# Patient Record
Sex: Male | Born: 1945 | Race: White | Hispanic: No | Marital: Married | Smoking: Former smoker
Health system: Southern US, Community
[De-identification: ages and names within clinical notes are randomized; demographics above are authoritative.]

## PROBLEM LIST (undated history)

## (undated) DIAGNOSIS — I10 Essential (primary) hypertension: Secondary | ICD-10-CM

## (undated) DIAGNOSIS — E119 Type 2 diabetes mellitus without complications: Secondary | ICD-10-CM

## (undated) DIAGNOSIS — G4730 Sleep apnea, unspecified: Secondary | ICD-10-CM

## (undated) DIAGNOSIS — E2740 Unspecified adrenocortical insufficiency: Secondary | ICD-10-CM

## (undated) DIAGNOSIS — M1990 Unspecified osteoarthritis, unspecified site: Secondary | ICD-10-CM

## (undated) DIAGNOSIS — T380X5A Adverse effect of glucocorticoids and synthetic analogues, initial encounter: Secondary | ICD-10-CM

## (undated) DIAGNOSIS — H269 Unspecified cataract: Secondary | ICD-10-CM

## (undated) DIAGNOSIS — T7840XA Allergy, unspecified, initial encounter: Secondary | ICD-10-CM

## (undated) HISTORY — DX: Type 2 diabetes mellitus without complications: E11.9

## (undated) HISTORY — PX: TOTAL KNEE ARTHROPLASTY: SHX125

## (undated) HISTORY — PX: CATARACT EXTRACTION: SUR2

## (undated) HISTORY — PX: NOSE SURGERY: SHX723

## (undated) HISTORY — DX: Allergy, unspecified, initial encounter: T78.40XA

## (undated) HISTORY — PX: CARPAL TUNNEL RELEASE: SHX101

## (undated) HISTORY — DX: Sleep apnea, unspecified: G47.30

## (undated) HISTORY — DX: Unspecified cataract: H26.9

## (undated) HISTORY — PX: JOINT REPLACEMENT: SHX530

## (undated) HISTORY — PX: HERNIA REPAIR: SHX51

## (undated) HISTORY — DX: Unspecified osteoarthritis, unspecified site: M19.90

## (undated) HISTORY — DX: Unspecified adrenocortical insufficiency: E27.40

## (undated) HISTORY — DX: Adverse effect of glucocorticoids and synthetic analogues, initial encounter: T38.0X5A

## (undated) HISTORY — PX: UMBILICAL HERNIA REPAIR: SHX196

## (undated) HISTORY — DX: Essential (primary) hypertension: I10

## (undated) HISTORY — PX: EYE SURGERY: SHX253

---

## 2014-10-25 DIAGNOSIS — J309 Allergic rhinitis, unspecified: Secondary | ICD-10-CM | POA: Diagnosis not present

## 2014-10-25 DIAGNOSIS — E119 Type 2 diabetes mellitus without complications: Secondary | ICD-10-CM | POA: Diagnosis not present

## 2014-10-25 DIAGNOSIS — G4733 Obstructive sleep apnea (adult) (pediatric): Secondary | ICD-10-CM | POA: Diagnosis not present

## 2014-10-25 DIAGNOSIS — I10 Essential (primary) hypertension: Secondary | ICD-10-CM | POA: Diagnosis not present

## 2014-10-25 DIAGNOSIS — E669 Obesity, unspecified: Secondary | ICD-10-CM | POA: Diagnosis not present

## 2014-11-23 DIAGNOSIS — M150 Primary generalized (osteo)arthritis: Secondary | ICD-10-CM | POA: Diagnosis not present

## 2014-11-23 DIAGNOSIS — I868 Varicose veins of other specified sites: Secondary | ICD-10-CM | POA: Diagnosis not present

## 2014-11-23 DIAGNOSIS — J302 Other seasonal allergic rhinitis: Secondary | ICD-10-CM | POA: Diagnosis not present

## 2014-11-23 DIAGNOSIS — R739 Hyperglycemia, unspecified: Secondary | ICD-10-CM | POA: Diagnosis not present

## 2014-11-23 DIAGNOSIS — E538 Deficiency of other specified B group vitamins: Secondary | ICD-10-CM | POA: Diagnosis not present

## 2014-11-23 DIAGNOSIS — J0101 Acute recurrent maxillary sinusitis: Secondary | ICD-10-CM | POA: Diagnosis not present

## 2014-11-23 DIAGNOSIS — E119 Type 2 diabetes mellitus without complications: Secondary | ICD-10-CM | POA: Diagnosis not present

## 2014-11-23 DIAGNOSIS — G4733 Obstructive sleep apnea (adult) (pediatric): Secondary | ICD-10-CM | POA: Diagnosis not present

## 2014-11-23 DIAGNOSIS — I10 Essential (primary) hypertension: Secondary | ICD-10-CM | POA: Diagnosis not present

## 2014-12-18 DIAGNOSIS — H04123 Dry eye syndrome of bilateral lacrimal glands: Secondary | ICD-10-CM | POA: Diagnosis not present

## 2014-12-18 DIAGNOSIS — Z961 Presence of intraocular lens: Secondary | ICD-10-CM | POA: Diagnosis not present

## 2015-02-26 DIAGNOSIS — J302 Other seasonal allergic rhinitis: Secondary | ICD-10-CM | POA: Diagnosis not present

## 2015-02-26 DIAGNOSIS — E538 Deficiency of other specified B group vitamins: Secondary | ICD-10-CM | POA: Diagnosis not present

## 2015-02-26 DIAGNOSIS — B0230 Zoster ocular disease, unspecified: Secondary | ICD-10-CM | POA: Diagnosis not present

## 2015-02-26 DIAGNOSIS — G4733 Obstructive sleep apnea (adult) (pediatric): Secondary | ICD-10-CM | POA: Diagnosis not present

## 2015-02-26 DIAGNOSIS — R739 Hyperglycemia, unspecified: Secondary | ICD-10-CM | POA: Diagnosis not present

## 2015-02-26 DIAGNOSIS — I868 Varicose veins of other specified sites: Secondary | ICD-10-CM | POA: Diagnosis not present

## 2015-02-26 DIAGNOSIS — I10 Essential (primary) hypertension: Secondary | ICD-10-CM | POA: Diagnosis not present

## 2015-02-26 DIAGNOSIS — M150 Primary generalized (osteo)arthritis: Secondary | ICD-10-CM | POA: Diagnosis not present

## 2015-02-27 DIAGNOSIS — B029 Zoster without complications: Secondary | ICD-10-CM | POA: Diagnosis not present

## 2015-03-07 DIAGNOSIS — E538 Deficiency of other specified B group vitamins: Secondary | ICD-10-CM | POA: Diagnosis not present

## 2015-03-07 DIAGNOSIS — G4733 Obstructive sleep apnea (adult) (pediatric): Secondary | ICD-10-CM | POA: Diagnosis not present

## 2015-03-07 DIAGNOSIS — M150 Primary generalized (osteo)arthritis: Secondary | ICD-10-CM | POA: Diagnosis not present

## 2015-03-07 DIAGNOSIS — I868 Varicose veins of other specified sites: Secondary | ICD-10-CM | POA: Diagnosis not present

## 2015-03-07 DIAGNOSIS — R5383 Other fatigue: Secondary | ICD-10-CM | POA: Diagnosis not present

## 2015-03-07 DIAGNOSIS — E2740 Unspecified adrenocortical insufficiency: Secondary | ICD-10-CM | POA: Diagnosis not present

## 2015-03-07 DIAGNOSIS — B0230 Zoster ocular disease, unspecified: Secondary | ICD-10-CM | POA: Diagnosis not present

## 2015-03-07 DIAGNOSIS — T380X5A Adverse effect of glucocorticoids and synthetic analogues, initial encounter: Secondary | ICD-10-CM | POA: Diagnosis not present

## 2015-03-07 DIAGNOSIS — I10 Essential (primary) hypertension: Secondary | ICD-10-CM | POA: Diagnosis not present

## 2015-03-07 DIAGNOSIS — J302 Other seasonal allergic rhinitis: Secondary | ICD-10-CM | POA: Diagnosis not present

## 2015-03-07 DIAGNOSIS — I9589 Other hypotension: Secondary | ICD-10-CM | POA: Diagnosis not present

## 2015-03-07 LAB — COMPREHENSIVE METABOLIC PANEL: Albumin: 3.8

## 2015-03-07 LAB — CBC AND DIFFERENTIAL
Hemoglobin: 17.3 g/dL (ref 13.5–17.5)
Platelets: 208 10*3/uL (ref 150–399)
WBC: 11.5 10^3/mL

## 2015-03-07 LAB — HEPATIC FUNCTION PANEL
ALT: 21 U/L (ref 10–40)
AST: 26 U/L (ref 14–40)
Alkaline Phosphatase: 58 U/L (ref 25–125)

## 2015-03-07 LAB — TSH: TSH: 0.52 u[IU]/mL (ref 0.41–5.90)

## 2015-03-07 LAB — BASIC METABOLIC PANEL
CREATININE: 1.5 mg/dL — AB (ref 0.6–1.3)
Glucose: 272 mg/dL
POTASSIUM: 4.2 mmol/L (ref 3.4–5.3)
SODIUM: 133 mmol/L — AB (ref 137–147)

## 2015-03-07 LAB — LIPID PANEL
CHOLESTEROL: 192 mg/dL (ref 0–200)
HDL: 31 mg/dL — AB (ref 35–70)
LDL Cholesterol: 111 mg/dL
Triglycerides: 250 mg/dL — AB (ref 40–160)

## 2015-03-07 LAB — CORTISOL: CORTISOL: 58.44

## 2015-03-07 LAB — PSA: PSA: 1.06

## 2015-03-11 DIAGNOSIS — B029 Zoster without complications: Secondary | ICD-10-CM | POA: Diagnosis not present

## 2015-04-15 DIAGNOSIS — Z23 Encounter for immunization: Secondary | ICD-10-CM | POA: Diagnosis not present

## 2015-06-13 DIAGNOSIS — J301 Allergic rhinitis due to pollen: Secondary | ICD-10-CM | POA: Diagnosis not present

## 2015-06-13 DIAGNOSIS — E119 Type 2 diabetes mellitus without complications: Secondary | ICD-10-CM | POA: Diagnosis not present

## 2015-06-13 DIAGNOSIS — I10 Essential (primary) hypertension: Secondary | ICD-10-CM | POA: Diagnosis not present

## 2015-06-13 DIAGNOSIS — Z125 Encounter for screening for malignant neoplasm of prostate: Secondary | ICD-10-CM | POA: Diagnosis not present

## 2015-06-13 DIAGNOSIS — Z0000 Encounter for general adult medical examination without abnormal findings: Secondary | ICD-10-CM | POA: Diagnosis not present

## 2015-06-13 DIAGNOSIS — G4733 Obstructive sleep apnea (adult) (pediatric): Secondary | ICD-10-CM | POA: Diagnosis not present

## 2015-06-13 DIAGNOSIS — E538 Deficiency of other specified B group vitamins: Secondary | ICD-10-CM | POA: Diagnosis not present

## 2015-06-13 DIAGNOSIS — M150 Primary generalized (osteo)arthritis: Secondary | ICD-10-CM | POA: Diagnosis not present

## 2015-06-13 LAB — HEMOGLOBIN A1C: HEMOGLOBIN A1C: 6.1

## 2015-06-13 LAB — LIPID PANEL
CHOLESTEROL: 192 mg/dL (ref 0–200)
HDL: 31 mg/dL — AB (ref 35–70)
LDL Cholesterol: 111 mg/dL
LDl/HDL Ratio: 3.6
Triglycerides: 250 mg/dL — AB (ref 40–160)

## 2015-06-13 LAB — CBC AND DIFFERENTIAL
HEMOGLOBIN: 17.3 g/dL (ref 13.5–17.5)
PLATELETS: 249 10*3/uL (ref 150–399)
WBC: 5.4 10^3/mL

## 2015-06-13 LAB — BASIC METABOLIC PANEL
CREATININE: 0.9 mg/dL (ref 0.6–1.3)
Glucose: 112 mg/dL
POTASSIUM: 4.6 mmol/L (ref 3.4–5.3)
SODIUM: 138 mmol/L (ref 137–147)

## 2015-06-13 LAB — PSA: PSA: 1.06

## 2015-06-13 LAB — HEPATIC FUNCTION PANEL
ALT: 30 U/L (ref 10–40)
AST: 33 U/L (ref 14–40)

## 2015-06-13 LAB — MICROALBUMIN, URINE: MICROALB UR: NEGATIVE

## 2015-08-21 DIAGNOSIS — R739 Hyperglycemia, unspecified: Secondary | ICD-10-CM | POA: Diagnosis not present

## 2015-08-21 DIAGNOSIS — E119 Type 2 diabetes mellitus without complications: Secondary | ICD-10-CM | POA: Diagnosis not present

## 2015-08-21 DIAGNOSIS — M1710 Unilateral primary osteoarthritis, unspecified knee: Secondary | ICD-10-CM | POA: Diagnosis not present

## 2015-08-21 DIAGNOSIS — J0101 Acute recurrent maxillary sinusitis: Secondary | ICD-10-CM | POA: Diagnosis not present

## 2015-08-21 DIAGNOSIS — J301 Allergic rhinitis due to pollen: Secondary | ICD-10-CM | POA: Diagnosis not present

## 2015-08-21 DIAGNOSIS — G4733 Obstructive sleep apnea (adult) (pediatric): Secondary | ICD-10-CM | POA: Diagnosis not present

## 2015-08-21 DIAGNOSIS — M150 Primary generalized (osteo)arthritis: Secondary | ICD-10-CM | POA: Diagnosis not present

## 2015-08-21 DIAGNOSIS — I10 Essential (primary) hypertension: Secondary | ICD-10-CM | POA: Diagnosis not present

## 2015-08-22 DIAGNOSIS — I10 Essential (primary) hypertension: Secondary | ICD-10-CM | POA: Diagnosis not present

## 2015-08-22 DIAGNOSIS — E119 Type 2 diabetes mellitus without complications: Secondary | ICD-10-CM | POA: Diagnosis not present

## 2015-08-22 DIAGNOSIS — E669 Obesity, unspecified: Secondary | ICD-10-CM | POA: Diagnosis not present

## 2015-08-22 DIAGNOSIS — J309 Allergic rhinitis, unspecified: Secondary | ICD-10-CM | POA: Diagnosis not present

## 2015-08-22 DIAGNOSIS — G4733 Obstructive sleep apnea (adult) (pediatric): Secondary | ICD-10-CM | POA: Diagnosis not present

## 2015-12-17 ENCOUNTER — Encounter: Payer: Self-pay | Admitting: Family Medicine

## 2015-12-17 ENCOUNTER — Ambulatory Visit (INDEPENDENT_AMBULATORY_CARE_PROVIDER_SITE_OTHER): Payer: Medicare Other | Admitting: Family Medicine

## 2015-12-17 VITALS — BP 128/77 | HR 79 | Ht 74.0 in | Wt 242.0 lb

## 2015-12-17 DIAGNOSIS — E11621 Type 2 diabetes mellitus with foot ulcer: Secondary | ICD-10-CM | POA: Insufficient documentation

## 2015-12-17 DIAGNOSIS — E119 Type 2 diabetes mellitus without complications: Secondary | ICD-10-CM | POA: Diagnosis not present

## 2015-12-17 DIAGNOSIS — E785 Hyperlipidemia, unspecified: Secondary | ICD-10-CM | POA: Diagnosis not present

## 2015-12-17 DIAGNOSIS — G4700 Insomnia, unspecified: Secondary | ICD-10-CM | POA: Insufficient documentation

## 2015-12-17 DIAGNOSIS — E1159 Type 2 diabetes mellitus with other circulatory complications: Secondary | ICD-10-CM | POA: Insufficient documentation

## 2015-12-17 DIAGNOSIS — E1142 Type 2 diabetes mellitus with diabetic polyneuropathy: Secondary | ICD-10-CM | POA: Diagnosis not present

## 2015-12-17 DIAGNOSIS — Z96652 Presence of left artificial knee joint: Secondary | ICD-10-CM

## 2015-12-17 DIAGNOSIS — I10 Essential (primary) hypertension: Secondary | ICD-10-CM

## 2015-12-17 DIAGNOSIS — E663 Overweight: Secondary | ICD-10-CM | POA: Insufficient documentation

## 2015-12-17 DIAGNOSIS — E669 Obesity, unspecified: Secondary | ICD-10-CM

## 2015-12-17 DIAGNOSIS — L97509 Non-pressure chronic ulcer of other part of unspecified foot with unspecified severity: Secondary | ICD-10-CM

## 2015-12-17 DIAGNOSIS — E1149 Type 2 diabetes mellitus with other diabetic neurological complication: Secondary | ICD-10-CM | POA: Insufficient documentation

## 2015-12-17 HISTORY — DX: Hyperlipidemia, unspecified: E78.5

## 2015-12-17 LAB — POCT GLYCOSYLATED HEMOGLOBIN (HGB A1C): HEMOGLOBIN A1C: 5.8

## 2015-12-17 MED ORDER — ATORVASTATIN CALCIUM 40 MG PO TABS: 40.0000 mg | ORAL_TABLET | Freq: Every day | ORAL | Status: DC

## 2015-12-17 NOTE — Assessment & Plan Note (Signed)
Continue current regimen.  Check labs in 3 months. 

## 2015-12-17 NOTE — Assessment & Plan Note (Signed)
Continue current regimen

## 2015-12-17 NOTE — Assessment & Plan Note (Signed)
Continue current diabetes regimen. Recheck labs in 3 months. Recommend daily foot inspection.  

## 2015-12-17 NOTE — Progress Notes (Signed)
Luis Fernandez is a 70 y.o. male who presents to [facility]: [facility] today for establish care today discuss diabetes and hypertension.  1) diabetes: Well controlled with the below medical regimen. His last A1c was 6.1 in November. He feels well without complications.  2) retention well-controlled with the below regimen. No chest pains palpitations or shortness of breath.  3) lipids: Patient notes a history of elevated triglycerides. He takes fenofibrate but has never taken a statin medication.  4) insomnia: Patient uses temazepam at night for insomnia.   Past Medical History  Diagnosis Date  . Hypertension   . Diabetes mellitus without complication [facility])    Past Surgical History  Procedure Laterality Date  . Cataract extraction    . Total knee arthroplasty    . Umbilical hernia repair    . Carpal tunnel release    . Nose surgery     Social History  Substance Use Topics  . Smoking status: Former Games developer  . Smokeless tobacco: Not on file  . Alcohol Use: 0.0 oz/week    0 Standard drinks or equivalent per week   family history includes Depression in his brother; Heart disease in his mother; Hypertension in his brother and mother.  ROS as above: No headache, visual changes, nausea, vomiting, diarrhea, constipation, dizziness, abdominal pain, skin rash, fevers, chills, night sweats, weight loss, swollen lymph nodes, body aches, joint swelling, muscle aches, chest pain, shortness of breath, mood changes, visual or auditory hallucinations.   Medications: Current Outpatient Prescriptions  Medication Sig Dispense Refill  . azelastine (ASTELIN) 0.1 % nasal spray     . BD PEN NEEDLE NANO U/F 32G X 4 MM MISC     . benazepril-hydrochlorthiazide (LOTENSIN HCT) 20-12.5 MG tablet     . cephALEXin (KEFLEX) 500 MG capsule     . Cyanocobalamin (B-12) 500 MCG TABS Take by mouth.    .  cyproheptadine (PERIACTIN) 4 MG tablet     . fenofibrate (TRICOR) 145 MG tablet     . Glucosamine 500 MG CAPS Take by mouth.    Marland Kitchen ibuprofen (ADVIL,MOTRIN) 600 MG tablet     . JARDIANCE 25 MG TABS tablet     . LORazepam (ATIVAN) 0.5 MG tablet     . MELATONIN PO Take by mouth.    . metFORMIN (GLUCOPHAGE-XR) 500 MG 24 hr tablet     . Multiple Vitamins-Minerals (MULTIVITAMIN PO) Take by mouth.    . Omega-3 Fatty Acids (FISH OIL PO) Take by mouth.    Lu[facility] DELICA LANCETS 33G MISC     . temazepam (RESTORIL) 30 MG capsule     . VICTOZA 18 MG/3ML SOPN     . atorvastatin (LIPITOR) 40 MG tablet Take 1 tablet (40 mg total) by mouth daily. 90 tablet 0   No current facility-administered medications for this visit.   No Known Allergies   Exam:  BP 128/77 mmHg  Pulse 79  Ht  (1.88 m)  Wt 242 lb (109.77 kg)  BMI 31.06 kg/m2 Gen: Well NAD, obese HEENT: EOMI,  MMM Lungs: Normal work of breathing. CTABL Heart: RRR no MRG Abd: NABS, Soft. Nondistended, Nontender Exts: Brisk capillary refill, warm and well perfused.  Diabetic Foot Exam - Simple   Simple Foot Form  Diabetic Foot exam was performed with the following findings:  Yes 12/17/2015 11:29 AM  Visual Inspection  Sensation Testing  Pulse Check  Comments  Small blood blister on the medial  aspect of the left great toe and a small blister between left toes 3 and 4. Several areas of athletes foot as well.        Results for orders placed or performed in visit on 12/17/15 (from the past 24 hour(s))  POCT HgB A1C     Status: None   Collection Time: 12/17/15 10:35 AM  Result Value Ref Range   Hemoglobin A1C 5.8    No results found.   Please see individual assessment and plan sections.

## 2015-12-17 NOTE — Assessment & Plan Note (Signed)
Continue current diabetes regimen. Recheck labs in 3 months. Recommend daily foot inspection.

## 2015-12-17 NOTE — Assessment & Plan Note (Signed)
Start Lipitor 40. Continue fenofibrate. Check lipids in 3 months.

## 2015-12-17 NOTE — Patient Instructions (Signed)
Thank you for coming in today. Take lipitor.  Return in 3 months.  Inspect your feet daily.  Return sooner if needed.   Diabetes and Foot Care Diabetes may cause you to have problems because of poor blood supply (circulation) to your feet and legs. This may cause the skin on your feet to become thinner, break easier, and heal more slowly. Your skin may become dry, and the skin may peel and crack. You may also have nerve damage in your legs and feet causing decreased feeling in them. You may not notice minor injuries to your feet that could lead to infections or more serious problems. Taking care of your feet is one of the most important things you can do for yourself.  HOME CARE INSTRUCTIONS  Wear shoes at all times, even in the house. Do not go barefoot. Bare feet are easily injured.  Check your feet daily for blisters, cuts, and redness. If you cannot see the bottom of your feet, use a mirror or ask someone for help.  Wash your feet with warm water (do not use hot water) and mild soap. Then pat your feet and the areas between your toes until they are completely dry. Do not soak your feet as this can dry your skin.  Apply a moisturizing lotion or petroleum jelly (that does not contain alcohol and is unscented) to the skin on your feet and to dry, brittle toenails. Do not apply lotion between your toes.  Trim your toenails straight across. Do not dig under them or around the cuticle. File the edges of your nails with an emery board or nail file.  Do not cut corns or calluses or try to remove them with medicine.  Wear clean socks or stockings every day. Make sure they are not too tight. Do not wear knee-high stockings since they may decrease blood flow to your legs.  Wear shoes that fit properly and have enough cushioning. To break in new shoes, wear them for just a few hours a day. This prevents you from injuring your feet. Always look in your shoes before you put them on to be sure there are  no objects inside.  Do not cross your legs. This may decrease the blood flow to your feet.  If you find a minor scrape, cut, or break in the skin on your feet, keep it and the skin around it clean and dry. These areas may be cleansed with mild soap and water. Do not cleanse the area with peroxide, alcohol, or iodine.  When you remove an adhesive bandage, be sure not to damage the skin around it.  If you have a wound, look at it several times a day to make sure it is healing.  Do not use heating pads or hot water bottles. They may burn your skin. If you have lost feeling in your feet or legs, you may not know it is happening until it is too late.  Make sure your health care provider performs a complete foot exam at least annually or more often if you have foot problems. Report any cuts, sores, or bruises to your health care provider immediately. SEEK MEDICAL CARE IF:   You have an injury that is not healing.  You have cuts or breaks in the skin.  You have an ingrown nail.  You notice redness on your legs or feet.  You feel burning or tingling in your legs or feet.  You have pain or cramps in your legs and  feet.  Your legs or feet are numb.  Your feet always feel cold. SEEK IMMEDIATE MEDICAL CARE IF:   There is increasing redness, swelling, or pain in or around a wound.  There is a red line that goes up your leg.  Pus is coming from a wound.  You develop a fever or as directed by your health care provider.  You notice a bad smell coming from an ulcer or wound.   This information is not intended to replace advice given to you by your health care provider. Make sure you discuss any questions you have with your health care provider.   Document Released: 07/17/2000 Document Revised: 03/22/2013 Document Reviewed: 12/27/2012 Elsevier Interactive Patient Education [employer].

## 2016-01-02 ENCOUNTER — Encounter: Payer: Self-pay | Admitting: Family Medicine

## 2016-01-03 ENCOUNTER — Encounter: Payer: Self-pay | Admitting: Family Medicine

## 2016-01-03 ENCOUNTER — Telehealth: Payer: Self-pay

## 2016-01-03 MED ORDER — METFORMIN HCL ER 500 MG PO TB24: ORAL_TABLET | ORAL | Status: DC

## 2016-01-03 NOTE — Telephone Encounter (Signed)
Pts wife advised that he will be out of this rx today. Will refill per provider note from 12/17/15 stating to Continue current regiment.

## 2016-01-03 NOTE — Telephone Encounter (Signed)
Pt is requesting a refill on metFORMIN (GLUCOPHAGE-XR) 500 MG 24 hr tablet. This is a historical rx. Please fill if appropriate.

## 2016-01-06 MED ORDER — METFORMIN HCL ER 500 MG PO TB24: 1000.0000 mg | ORAL_TABLET | Freq: Two times a day (BID) | ORAL | Status: DC

## 2016-01-06 NOTE — Telephone Encounter (Signed)
1 

## 2016-01-08 ENCOUNTER — Ambulatory Visit: Payer: Self-pay | Admitting: Internal Medicine

## 2016-01-10 ENCOUNTER — Encounter: Payer: Self-pay | Admitting: Family Medicine

## 2016-01-12 ENCOUNTER — Encounter: Payer: Self-pay | Admitting: Family Medicine

## 2016-01-21 ENCOUNTER — Encounter: Payer: Self-pay | Admitting: Family Medicine

## 2016-01-21 DIAGNOSIS — T380X5A Adverse effect of glucocorticoids and synthetic analogues, initial encounter: Secondary | ICD-10-CM

## 2016-01-21 DIAGNOSIS — E538 Deficiency of other specified B group vitamins: Secondary | ICD-10-CM | POA: Insufficient documentation

## 2016-01-21 DIAGNOSIS — E2740 Unspecified adrenocortical insufficiency: Secondary | ICD-10-CM

## 2016-01-21 HISTORY — DX: Adverse effect of glucocorticoids and synthetic analogues, initial encounter: T38.0X5A

## 2016-01-21 HISTORY — DX: Unspecified adrenocortical insufficiency: E27.40

## 2016-01-29 ENCOUNTER — Encounter: Payer: Self-pay | Admitting: Family Medicine

## 2016-02-01 ENCOUNTER — Encounter: Payer: Self-pay | Admitting: Family Medicine

## 2016-02-03 MED ORDER — JARDIANCE 25 MG PO TABS: 25.0000 mg | ORAL_TABLET | Freq: Every day | ORAL | Status: DC

## 2016-02-03 NOTE — Telephone Encounter (Signed)
Ok, sent 90 days supply with refills

## 2016-02-21 ENCOUNTER — Encounter: Payer: Self-pay | Admitting: Family Medicine

## 2016-02-21 MED ORDER — FENOFIBRATE 145 MG PO TABS: 145.0000 mg | ORAL_TABLET | Freq: Every day | ORAL | Status: DC

## 2016-02-24 ENCOUNTER — Encounter: Payer: Self-pay | Admitting: Family Medicine

## 2016-02-25 ENCOUNTER — Encounter: Payer: Self-pay | Admitting: Family Medicine

## 2016-02-25 DIAGNOSIS — E119 Type 2 diabetes mellitus without complications: Secondary | ICD-10-CM | POA: Diagnosis not present

## 2016-02-25 DIAGNOSIS — H43393 Other vitreous opacities, bilateral: Secondary | ICD-10-CM | POA: Diagnosis not present

## 2016-02-25 LAB — HM DIABETES EYE EXAM

## 2016-02-26 MED ORDER — IBUPROFEN 600 MG PO TABS: 600.0000 mg | ORAL_TABLET | Freq: Three times a day (TID) | ORAL | 1 refills | Status: DC | PRN

## 2016-02-28 ENCOUNTER — Encounter: Payer: Self-pay | Admitting: Family Medicine

## 2016-03-18 ENCOUNTER — Encounter: Payer: Self-pay | Admitting: Family Medicine

## 2016-03-18 ENCOUNTER — Ambulatory Visit (INDEPENDENT_AMBULATORY_CARE_PROVIDER_SITE_OTHER): Payer: Medicare Other | Admitting: Family Medicine

## 2016-03-18 VITALS — BP 120/68 | HR 71 | Temp 98.1°F | Wt 249.0 lb

## 2016-03-18 DIAGNOSIS — M949 Disorder of cartilage, unspecified: Secondary | ICD-10-CM

## 2016-03-18 DIAGNOSIS — E785 Hyperlipidemia, unspecified: Secondary | ICD-10-CM | POA: Diagnosis not present

## 2016-03-18 DIAGNOSIS — Z1159 Encounter for screening for other viral diseases: Secondary | ICD-10-CM

## 2016-03-18 DIAGNOSIS — E1142 Type 2 diabetes mellitus with diabetic polyneuropathy: Secondary | ICD-10-CM

## 2016-03-18 DIAGNOSIS — M899 Disorder of bone, unspecified: Secondary | ICD-10-CM

## 2016-03-18 DIAGNOSIS — E538 Deficiency of other specified B group vitamins: Secondary | ICD-10-CM | POA: Diagnosis not present

## 2016-03-18 DIAGNOSIS — I10 Essential (primary) hypertension: Secondary | ICD-10-CM

## 2016-03-18 LAB — LIPID PANEL
Cholesterol: 168 mg/dL (ref 125–200)
HDL: 33 mg/dL — ABNORMAL LOW (ref >=40)
LDL Cholesterol: 93 mg/dL (ref <130)
Total CHOL/HDL Ratio: 5.1 Ratio — ABNORMAL HIGH (ref <=5.0)
Triglycerides: 211 mg/dL — ABNORMAL HIGH (ref <150)
VLDL: 42 mg/dL — AB (ref <30)

## 2016-03-18 LAB — POCT GLYCOSYLATED HEMOGLOBIN (HGB A1C): HEMOGLOBIN A1C: 5.9

## 2016-03-18 LAB — COMPREHENSIVE METABOLIC PANEL
ALK PHOS: 41 U/L (ref 40–115)
ALT: 32 U/L (ref 9–46)
AST: 35 U/L (ref 10–35)
Albumin: 4.5 g/dL (ref 3.6–5.1)
BILIRUBIN TOTAL: 0.7 mg/dL (ref 0.2–1.2)
BUN: 22 mg/dL (ref 7–25)
CO2: 27 mmol/L (ref 20–31)
CREATININE: 0.99 mg/dL (ref 0.70–1.18)
Calcium: 9.7 mg/dL (ref 8.6–10.3)
Chloride: 104 mmol/L (ref 98–110)
Glucose, Bld: 104 mg/dL — ABNORMAL HIGH (ref 65–99)
Potassium: 4.2 mmol/L (ref 3.5–5.3)
SODIUM: 139 mmol/L (ref 135–146)
Total Protein: 6.5 g/dL (ref 6.1–8.1)

## 2016-03-18 LAB — CBC
HCT: 46.3 % (ref 38.5–50.0)
HEMOGLOBIN: 15.9 g/dL (ref 13.2–17.1)
MCH: 32.5 pg (ref 27.0–33.0)
MCHC: 34.3 g/dL (ref 32.0–36.0)
MCV: 94.7 fL (ref 80.0–100.0)
MPV: 9 fL (ref 7.5–12.5)
PLATELETS: 255 10*3/uL (ref 140–400)
RBC: 4.89 MIL/uL (ref 4.20–5.80)
RDW: 13.5 % (ref 11.0–15.0)
WBC: 5.2 10*3/uL (ref 3.8–10.8)

## 2016-03-18 LAB — VITAMIN B12: VITAMIN B 12: 430 pg/mL (ref 200–1100)

## 2016-03-18 MED ORDER — DULAGLUTIDE 0.75 MG/0.5ML ~~LOC~~ SOAJ: 0.5000 mL | SUBCUTANEOUS | 11 refills | Status: DC

## 2016-03-18 MED ORDER — EMPAGLIFLOZIN 25 MG PO TABS: 25.0000 mg | ORAL_TABLET | Freq: Every day | ORAL | 2 refills | Status: DC

## 2016-03-18 MED ORDER — LIRAGLUTIDE 18 MG/3ML ~~LOC~~ SOPN: 1.8000 mg | PEN_INJECTOR | Freq: Every day | SUBCUTANEOUS | 12 refills | Status: DC

## 2016-03-18 MED ORDER — LORAZEPAM 0.5 MG PO TABS: 0.5000 mg | ORAL_TABLET | Freq: Every day | ORAL | 5 refills | Status: DC | PRN

## 2016-03-18 MED ORDER — PITAVASTATIN CALCIUM 4 MG PO TABS: 1.0000 | ORAL_TABLET | Freq: Every day | ORAL | 0 refills | Status: DC

## 2016-03-18 MED ORDER — TEMAZEPAM 30 MG PO CAPS: 30.0000 mg | ORAL_CAPSULE | Freq: Every evening | ORAL | 5 refills | Status: DC | PRN

## 2016-03-18 NOTE — Progress Notes (Signed)
Luis Fernandez is a 70 y.o. male who [name] today for follow-up diabetes, hypertension, hyperlipidemia, B-12 deficiency.  1) diabetes: Doing very well with current regimen. Patient brings a glucose log to the visit today showing blood sugars ranging from 86-159. He notes most of the sugars are in the normal range with no polyuria or polydipsia. He tolerates his medications well.  2) hypertension: Patient is also doing well with the below regimen. No chest pains palpitations or shortness of breath.  3) hyperlipidemia: Patient notes he had trouble tolerating Lipitor. It caused muscle cramps and pain. His symptoms resolved when he discontinued Lipitor on his own.  4) B-12 deficiency: Patient is doing well with oral B-12. No new paresthesias.  5) health maintenance: Patient declines colonoscopy stating he's not interested in any further colonoscopies for screening for colon cancer at this time. He is interested in hepatitis C screening.   Past Medical History:  Diagnosis Date  . Diabetes mellitus without complication (HCC)   . Hypertension    Past Surgical History:  Procedure Laterality Date  . CARPAL TUNNEL RELEASE    . CATARACT EXTRACTION    . NOSE SURGERY    . TOTAL KNEE ARTHROPLASTY    . UMBILICAL HERNIA REPAIR     Social History  Substance Use Topics  . Smoking status: Former Games developermoker  . Smokeless tobacco: Not on file  . Alcohol use 0.0 oz/week   family history includes Depression in his brother; Heart disease in his mother; Hypertension in his brother and mother.  ROS as above:  Medications: Current Outpatient Prescriptions  Medication Sig Dispense Refill  . azelastine (ASTELIN) 0.1 % nasal spray     . BD PEN NEEDLE NANO U/F 32G X 4 MM MISC     . benazepril-hydrochlorthiazide (LOTENSIN HCT) 20-12.5 MG tablet     . cephALEXin (KEFLEX)  500 MG capsule     . Cyanocobalamin (B-12) 500 MCG TABS Take by mouth.    . cyproheptadine (PERIACTIN) 4 MG tablet     . empagliflozin (JARDIANCE) 25 MG TABS tablet Take 25 mg by mouth daily. 90 tablet 2  . fenofibrate (TRICOR) 145 MG tablet Take 1 tablet (145 mg total) by mouth daily. 90 tablet 1  . Glucosamine 500 MG CAPS Take by mouth.    Marland Kitchen. ibuprofen (ADVIL,MOTRIN) 600 MG tablet Take 1 tablet (600 mg total) by mouth every 8 (eight) hours as needed. 90 tablet 1  . Liraglutide (VICTOZA) 18 MG/3ML SOPN Inject 0.3 mLs (1.8 mg total) into the skin daily. 6 mL 12  . LORazepam (ATIVAN) 0.5 MG tablet Take 1 tablet (0.5 mg total) by mouth daily as needed (Cramps). 30 tablet 5  . MELATONIN PO Take by mouth.    . metFORMIN (GLUCOPHAGE-XR) 500 MG 24 hr tablet Take 2 tablets (1,000 mg total) by mouth 2 (two) times daily. Take 1 tablet by mouth twice daily. 120 tablet 4  . Multiple Vitamins-Minerals (MULTIVITAMIN PO) Take by mouth.    . Omega-3 Fatty Acids (FISH OIL PO) Take by mouth.    [name]. ONETOUCH DELICA LANCETS 33G MISC     . Pitavastatin Calcium 4 MG TABS Take 1 tablet (4 mg total) by mouth daily. 90 tablet 0  . temazepam (RESTORIL) 30 MG capsule Take 1 capsule (30 mg total) by mouth at bedtime as needed for sleep. 30 capsule 5   No current facility-administered medications for this visit.  No Known Allergies   Exam:  BP 120/68 (BP Location: Right Arm, Patient Position: Sitting, Cuff Size: Normal)   Pulse 71   Temp 98.1 F (36.7 C) (Oral)   Wt 249 lb 0.6 oz (113 kg)   SpO2 95%   BMI 31.97 kg/m  Gen: Well NAD Nontoxic appearing HEENT: EOMI,  MMM Lungs: Normal work of breathing. CTABL Heart: RRR no MRG Abd: NABS, Soft. Nondistended, Nontender Exts: Brisk capillary refill, warm and well perfused. Normal diabetic foot exam with no ulcerations present today. Pulses intact bilaterally.  Results for orders placed or performed in visit on 03/18/16 (from the past 24 hour(s))  POCT HgB A1C      Status: None   Collection Time: 03/18/16 10:34 AM  Result Value Ref Range   Hemoglobin A1C 5.9    No results found.    Assessment and Plan: 70 y.o. male with  1) diabetes: Doing well. Continue current regimen.  2) hypertension: Also doing well. Continue current regimen. 3) hyperlipidemia: Not at goal. Intolerant to Lipitor. Trial of Livalo. Check fasting labs and recheck in 3 months. 4) B-12 deficiency: Doing well. Check B-12 today 5) health maintenance: Colonoscopy deferred. Check hepatitis C. Patient will get a high dose flu vaccine when they are available   Orders Placed This Encounter  Procedures  . CBC  . Comprehensive metabolic panel    Order Specific Question:   Has the patient fasted?    Answer:   No  . Lipid panel    Order Specific Question:   Has the patient fasted?    Answer:   No  . VITAMIN D 25 Hydroxy (Vit-D Deficiency, Fractures)  . Hepatitis C antibody  . Vitamin B12  . POCT HgB A1C    Discussed warning signs or symptoms. Please see discharge instructions. Patient expresses understanding.

## 2016-03-18 NOTE — Patient Instructions (Signed)
Thank you for coming in today. Get labs today.  We will contact you with results.  Return in 3 months for recheck.  Ask you pharmacist if Trulicity or Bydureon would be cheaper.  We will switch to Livalo for cholesterol.  Let me know if you get cramps or muscle pain.  I recommend getting the high dose flu vaccine this fall either here or at a pharmacy.  If you get it at a pharmacy please let me know.    Pitavastatin oral tablets What is this medicine? PITAVASTATIN (pit A va STAT in) is known as a HMG-CoA reductase inhibitor or 'statin'. It lowers the level of cholesterol and triglycerides in the blood. Diet and lifestyle changes are often used with this drug. This medicine may be used for other purposes; ask your health care provider or pharmacist if you have questions. What should I tell my health care provider before I take this medicine? They need to know if you have any of these conditions: - frequently drink alcoholic beverages - kidney disease - liver disease - muscle aches or weakness - other medical condition - an unusual or allergic reaction to pitavastatin, other medicines, foods, dyes, or preservatives - pregnant or trying to get pregnant - breast-feeding How should I use this medicine? Take this medicine by mouth with a glass of water. Follow the directions on the prescription label. You can take this medicine with or without food. Take your doses at regular intervals. Do not take your medicine more often than directed. Talk to your pediatrician regarding the use of this medicine in children. Special care may be needed. Overdosage: If you think you have taken too much of this medicine contact a poison control center or emergency room at once. NOTE: This medicine is only for you. Do not share this medicine with others. What if I miss a dose? If you miss a dose, take it as soon as you can. If it is almost time for your next dose, take only that dose. Do not take double or  extra doses. What may interact with this medicine? Do not take this medicine with any of the following medications: - cyclosporine - gemfibrozil - herbal medicines like red yeast rice This medicine may also interact with the following medications: - alcohol - antiviral medicines for HIV or AIDS - erythromycin - other medicines for cholesterol - rifampin - warfarin This list may not describe all possible interactions. Give your health care provider a list of all the medicines, herbs, non-prescription drugs, or dietary supplements you use. Also tell them if you smoke, drink alcohol, or use illegal drugs. Some items may interact with your medicine. What should I watch for while using this medicine? Visit your doctor or health care professional for regular check-ups. You may need regular tests to make sure your liver is working properly. Tell your doctor or health care professional right away if you get any unexplained muscle pain, tenderness, or weakness, especially if you also have a fever and tiredness. Your doctor or health care professional may tell you to stop taking this medicine if you develop muscle problems. If your muscle problems do not go away after stopping this medicine, contact your health care professional. This medicine may affect blood sugar levels. If you have diabetes, check with your doctor or health care professional before you change your diet or the dose of your diabetic medicine. This drug is only part of a total heart-health program. Your doctor or a dietician can suggest  a low-cholesterol and low-fat diet to help. Avoid alcohol and smoking, and keep a proper exercise schedule. Do not become pregnant while taking this medicine. Women should inform their doctor if they wish to become pregnant or think they might be pregnant. There is a potential for serious side effects to an unborn child. Do not breast-feed an infant while taking this medicine. Serious side effects to an  unborn child or to an infant are possible. Talk to your doctor or pharmacist for more information. What side effects may I notice from receiving this medicine? Side effects that you should report to your doctor or health care professional as soon as possible: - allergic reactions like skin rash, itching or hives, swelling of the face, lips, or tongue - dark urine - fever - joint pain - muscle cramps or pain - redness, blistering, peeling or loosening of the skin, including inside the mouth - trouble passing urine or change in the amount of urine - unusually weak or tired - yellowing of the eyes or skin Side effects that usually do not require medical attention (Report these to your doctor or health care professional if they continue or are bothersome.): - constipation - heartburn - nausea This list may not describe all possible side effects. Call your doctor for medical advice about side effects. You may report side effects to FDA at 1-800-FDA-1088. Where should I keep my medicine? Keep out of the reach of children. Store at room temperature between 15 and 30 degrees C (59 and 86 degrees F). Protect from light. Throw away any unused medicine after the expiration date. NOTE: This sheet is a summary. It may not cover all possible information. If you have questions about this medicine, talk to your doctor, pharmacist, or health care provider.    2016, Elsevier/Gold Standard. (2012-05-18 17:18:15)

## 2016-03-18 NOTE — Progress Notes (Signed)
Pt here for follow up POC A1c-5.9

## 2016-03-19 LAB — VITAMIN D 25 HYDROXY (VIT D DEFICIENCY, FRACTURES): VIT D 25 HYDROXY: 32 ng/mL (ref 30–100)

## 2016-03-19 LAB — HEPATITIS C ANTIBODY: HCV AB: NEGATIVE

## 2016-03-20 ENCOUNTER — Encounter: Payer: Self-pay | Admitting: Family Medicine

## 2016-03-20 ENCOUNTER — Telehealth: Payer: Self-pay | Admitting: *Deleted

## 2016-03-20 NOTE — Telephone Encounter (Signed)
PA sent through covermymeds Key: [id number] - PA Case ID: [id number]

## 2016-03-23 NOTE — Telephone Encounter (Signed)
livalo has been denied by insurance. Letter placed in providers box.

## 2016-03-24 ENCOUNTER — Encounter: Payer: Self-pay | Admitting: Family Medicine

## 2016-03-26 MED ORDER — PRAVASTATIN SODIUM 80 MG PO TABS: 80.0000 mg | ORAL_TABLET | Freq: Every day | ORAL | 0 refills | Status: DC

## 2016-03-27 ENCOUNTER — Encounter: Payer: Self-pay | Admitting: Family Medicine

## 2016-04-20 ENCOUNTER — Encounter: Payer: Self-pay | Admitting: Family Medicine

## 2016-04-21 MED ORDER — BENAZEPRIL-HYDROCHLOROTHIAZIDE 20-12.5 MG PO TABS: 1.0000 | ORAL_TABLET | Freq: Every day | ORAL | 3 refills | Status: DC

## 2016-05-12 ENCOUNTER — Encounter: Payer: Self-pay | Admitting: Family Medicine

## 2016-05-12 DIAGNOSIS — Z23 Encounter for immunization: Secondary | ICD-10-CM | POA: Diagnosis not present

## 2016-05-26 ENCOUNTER — Other Ambulatory Visit: Payer: Self-pay | Admitting: Family Medicine

## 2016-06-02 ENCOUNTER — Encounter: Payer: Self-pay | Admitting: Family Medicine

## 2016-06-04 MED ORDER — LIRAGLUTIDE 18 MG/3ML ~~LOC~~ SOPN: 1.8000 mg | PEN_INJECTOR | Freq: Every day | SUBCUTANEOUS | 3 refills | Status: DC

## 2016-06-11 ENCOUNTER — Other Ambulatory Visit: Payer: Self-pay | Admitting: Family Medicine

## 2016-06-11 ENCOUNTER — Encounter: Payer: Self-pay | Admitting: Family Medicine

## 2016-06-18 ENCOUNTER — Ambulatory Visit: Payer: Medicare Other | Admitting: Family Medicine

## 2016-06-23 ENCOUNTER — Encounter: Payer: Self-pay | Admitting: Family Medicine

## 2016-06-23 ENCOUNTER — Ambulatory Visit (INDEPENDENT_AMBULATORY_CARE_PROVIDER_SITE_OTHER): Payer: Medicare Other | Admitting: Family Medicine

## 2016-06-23 VITALS — BP 131/78 | HR 77 | Wt 247.0 lb

## 2016-06-23 DIAGNOSIS — I10 Essential (primary) hypertension: Secondary | ICD-10-CM | POA: Diagnosis not present

## 2016-06-23 DIAGNOSIS — G4733 Obstructive sleep apnea (adult) (pediatric): Secondary | ICD-10-CM

## 2016-06-23 DIAGNOSIS — E1142 Type 2 diabetes mellitus with diabetic polyneuropathy: Secondary | ICD-10-CM

## 2016-06-23 DIAGNOSIS — E785 Hyperlipidemia, unspecified: Secondary | ICD-10-CM | POA: Diagnosis not present

## 2016-06-23 DIAGNOSIS — J0111 Acute recurrent frontal sinusitis: Secondary | ICD-10-CM

## 2016-06-23 DIAGNOSIS — R202 Paresthesia of skin: Secondary | ICD-10-CM

## 2016-06-23 LAB — POCT GLYCOSYLATED HEMOGLOBIN (HGB A1C): Hemoglobin A1C: 6.1

## 2016-06-23 MED ORDER — PRAVASTATIN SODIUM 80 MG PO TABS: 80.0000 mg | ORAL_TABLET | Freq: Every day | ORAL | 3 refills | Status: DC

## 2016-06-23 MED ORDER — CEPHALEXIN 500 MG PO CAPS: 500.0000 mg | ORAL_CAPSULE | Freq: Three times a day (TID) | ORAL | 0 refills | Status: DC

## 2016-06-23 NOTE — Patient Instructions (Signed)
Thank you for coming in today. Return for recheck in about 6 months.  Return sooner if needed.  Let me know if you do not hear from sleep medicine.    Carpal Tunnel Syndrome Carpal tunnel syndrome is a condition that causes pain in your hand and arm. The carpal tunnel is a narrow area located on the palm side of your wrist. Repeated wrist motion or certain diseases may cause swelling within the tunnel. This swelling pinches the main nerve in the wrist (median nerve). What are the causes? This condition may be caused by:  Repeated wrist motions.  Wrist injuries.  Arthritis.  A cyst or tumor in the carpal tunnel.  Fluid buildup during pregnancy. Sometimes the cause of this condition is not known. What increases the risk? This condition is more likely to develop in:  People who have jobs that cause them to repeatedly move their wrists in the same motion, such as Health visitorbutchers and cashiers.  Women.  People with certain conditions, such as:  Diabetes.  Obesity.  An underactive thyroid (hypothyroidism).  Kidney failure. What are the signs or symptoms? Symptoms of this condition include:  A tingling feeling in your fingers, especially in your thumb, index, and middle fingers.  Tingling or numbness in your hand.  An aching feeling in your entire arm, especially when your wrist and elbow are bent for long periods of time.  Wrist pain that goes up your arm to your shoulder.  Pain that goes down into your palm or fingers.  A weak feeling in your hands. You may have trouble grabbing and holding items. Your symptoms may feel worse during the night. How is this diagnosed? This condition is diagnosed with a medical history and physical exam. You may also have tests, including:  An electromyogram (EMG). This test measures electrical signals sent by your nerves into the muscles.  X-rays. How is this treated? Treatment for this condition includes:  Lifestyle changes. It is  important to stop doing or modify the activity that caused your condition.  Physical or occupational therapy.  Medicines for pain and inflammation. This may include medicine that is injected into your wrist.  A wrist splint.  Surgery. Follow these instructions at home: If you have a splint:  Wear it as told by your health care provider. Remove it only as told by your health care provider.  Loosen the splint if your fingers become numb and tingle, or if they turn cold and blue.  Keep the splint clean and dry. General instructions  Take over-the-counter and prescription medicines only as told by your health care provider.  Rest your wrist from any activity that may be causing your pain. If your condition is work related, talk to your employer about changes that can be made, such as getting a wrist pad to use while typing.  If directed, apply ice to the painful area:  Put ice in a plastic bag.  Place a towel between your skin and the bag.  Leave the ice on for 20 minutes, 2-3 times per day.  Keep all follow-up visits as told by your health care provider. This is important.  Do any exercises as told by your health care provider, physical therapist, or occupational therapist. Contact a health care provider if:  You have new symptoms.  Your pain is not controlled with medicines.  Your symptoms get worse. This information is not intended to replace advice given to you by your health care provider. Make sure you discuss any  questions you have with your health care provider. Document Released: 07/17/2000 Document Revised: 11/28/2015 Document Reviewed: 12/05/2014 Elsevier Interactive Patient Education  2017 [employer]

## 2016-06-23 NOTE — Progress Notes (Signed)
Luis Fernandez is a 70 y.o. male who [name] today for follow-up diabetes paresthesias hypertension hyperlipidemia.  Diabetes: Doing well with the below medications no polyuria or polydipsia. Blood sugars typically range between 110 and 150. He feels well.  Hypertension doing well with the below medications no chest pain palpitations shortness of breath.  Hyperlipidemia doing well with the below medications no significant leg pain or weakness.  Paresthesias: Patient has paresthesias to his hands bilaterally. This occurs intermittently usually after hand activity. He has a surgical history significant for bilateral carpal tunnel release. Symptoms are somewhat consistent with carpal tunnel syndrome but are very mild.   Past Medical History:  Diagnosis Date  . Diabetes mellitus without complication (HCC)   . Hypertension    Past Surgical History:  Procedure Laterality Date  . CARPAL TUNNEL RELEASE    . CATARACT EXTRACTION    . NOSE SURGERY    . TOTAL KNEE ARTHROPLASTY    . UMBILICAL HERNIA REPAIR     Social History  Substance Use Topics  . Smoking status: Former Games developermoker  . Smokeless tobacco: Not on file  . Alcohol use 0.0 oz/week   family history includes Depression in his brother; Heart disease in his mother; Hypertension in his brother and mother.  ROS as above:  Medications: Current Outpatient Prescriptions  Medication Sig Dispense Refill  . azelastine (ASTELIN) 0.1 % nasal spray     . BD PEN NEEDLE NANO U/F 32G X 4 MM MISC     . benazepril-hydrochlorthiazide (LOTENSIN HCT) 20-12.5 MG tablet Take 1 tablet by mouth daily. 90 tablet 3  . Cyanocobalamin (B-12) 500 MCG TABS Take by mouth.    . empagliflozin (JARDIANCE) 25 MG TABS tablet Take 25 mg by mouth daily. 90 tablet 2  . fenofibrate (TRICOR) 145 MG tablet Take 1 tablet (145 mg  total) by mouth daily. 90 tablet 1  . Glucosamine 500 MG CAPS Take by mouth.    Marland Kitchen. ibuprofen (ADVIL,MOTRIN) 600 MG tablet TAKE ONE TABLET BY MOUTH EVERY 8 HOURS AS NEEDED 90 tablet 0  . liraglutide (VICTOZA) 18 MG/3ML SOPN Inject 0.3 mLs (1.8 mg total) into the skin daily. Please include needles. 9 mL 3  . LORazepam (ATIVAN) 0.5 MG tablet Take 1 tablet (0.5 mg total) by mouth daily as needed (Cramps). 30 tablet 5  . MELATONIN PO Take by mouth.    . metFORMIN (GLUCOPHAGE-XR) 500 MG 24 hr tablet TAKE TWO TABLETS BY MOUTH TWO TIMES  A DAY 120 tablet 5  . Multiple Vitamins-Minerals (MULTIVITAMIN PO) Take by mouth.    . Omega-3 Fatty Acids (FISH OIL PO) Take by mouth.    [name]. ONETOUCH DELICA LANCETS 33G MISC     . pravastatin (PRAVACHOL) 80 MG tablet Take 1 tablet (80 mg total) by mouth daily. 90 tablet 3  . temazepam (RESTORIL) 30 MG capsule Take 1 capsule (30 mg total) by mouth at bedtime as needed for sleep. 30 capsule 5  . cephALEXin (KEFLEX) 500 MG capsule Take 1 capsule (500 mg total) by mouth 3 (three) times daily. 21 capsule 0   No current facility-administered medications for this visit.    No Known Allergies  Health Maintenance Health Maintenance  Topic Date Due  . COLONOSCOPY  05/02/2024 (Originally 01/15/1995)  . HEMOGLOBIN A1C  09/18/2016  . OPHTHALMOLOGY EXAM  02/24/2017  . FOOT EXAM  03/18/2017  . TETANUS/TDAP  03/05/2019  . INFLUENZA VACCINE  Completed  . ZOSTAVAX  Completed  . Hepatitis C Screening  Completed  . PNA vac Low Risk Adult  Completed     Exam:  BP 131/78   Pulse 77   Wt 247 lb (112 kg)   BMI 31.71 kg/m  Gen: Well NAD HEENT: EOMI,  MMM Lungs: Normal work of breathing. CTABL Heart: RRR no MRG Abd: NABS, Soft. Nondistended, Nontender Exts: Brisk capillary refill, warm and well perfused.  Hands normal-appearing bilaterally normal sensation nontender.   Results for orders placed or performed in visit on 06/23/16 (from the past 72 hour(s))  POCT HgB A1C      Status: None   Collection Time: 06/23/16 11:31 AM  Result Value Ref Range   Hemoglobin A1C 6.1    No results found.    Assessment and Plan: 70 y.o. male with  Diabetes doing well continue current regimen.  Hypertension: Doing well continue current regimen.  Hyperlipidemia: Doing well continue current regimen.  Paresthesias concern for recurring carpal tunnel syndrome. Plan for watchful waiting his symptoms are mild.  Recheck in 6 months.   Orders Placed This Encounter  Procedures  . Ambulatory referral to Sleep Studies    Referral Priority:   Routine    Referral Type:   Consultation    Referral Reason:   Specialty Services Required    Requested Specialty:   Sleep Medicine    Number of Visits Requested:   1  . POCT HgB A1C    Discussed warning signs or symptoms. Please see discharge instructions. Patient expresses understanding.

## 2016-06-28 ENCOUNTER — Encounter: Payer: Self-pay | Admitting: Family Medicine

## 2016-06-30 MED ORDER — AZELASTINE HCL 0.1 % NA SOLN: 1.0000 | Freq: Two times a day (BID) | NASAL | 11 refills | Status: DC

## 2016-07-01 ENCOUNTER — Other Ambulatory Visit: Payer: Self-pay

## 2016-07-01 ENCOUNTER — Encounter: Payer: Self-pay | Admitting: Family Medicine

## 2016-07-01 MED ORDER — IBUPROFEN 600 MG PO TABS: 600.0000 mg | ORAL_TABLET | Freq: Three times a day (TID) | ORAL | 0 refills | Status: DC | PRN

## 2016-07-01 MED ORDER — IBUPROFEN 600 MG PO TABS: 600.0000 mg | ORAL_TABLET | Freq: Three times a day (TID) | ORAL | 11 refills | Status: DC | PRN

## 2016-07-02 ENCOUNTER — Encounter: Payer: Self-pay | Admitting: Family Medicine

## 2016-07-06 MED ORDER — DAPAGLIFLOZIN PROPANEDIOL 10 MG PO TABS: 10.0000 mg | ORAL_TABLET | Freq: Every day | ORAL | 3 refills | Status: DC

## 2016-08-12 ENCOUNTER — Other Ambulatory Visit: Payer: Self-pay | Admitting: Family Medicine

## 2016-08-12 DIAGNOSIS — G4733 Obstructive sleep apnea (adult) (pediatric): Secondary | ICD-10-CM

## 2016-08-17 ENCOUNTER — Telehealth: Payer: Self-pay

## 2016-08-17 NOTE — Telephone Encounter (Signed)
Luis BalesLawrence Fernandez called stating that he does not need a repeat sleep study. He just needed to have his machine serviced. Pt has requested records from his previous provider and will not have a sleep study.

## 2016-08-21 ENCOUNTER — Other Ambulatory Visit: Payer: Self-pay | Admitting: Family Medicine

## 2016-09-12 ENCOUNTER — Other Ambulatory Visit: Payer: Self-pay | Admitting: Family Medicine

## 2016-09-15 ENCOUNTER — Encounter: Payer: Self-pay | Admitting: Family Medicine

## 2016-09-15 ENCOUNTER — Other Ambulatory Visit: Payer: Self-pay

## 2016-09-15 MED ORDER — TEMAZEPAM 30 MG PO CAPS: 30.0000 mg | ORAL_CAPSULE | Freq: Every evening | ORAL | 5 refills | Status: DC | PRN

## 2016-09-15 MED ORDER — LORAZEPAM 0.5 MG PO TABS: 0.5000 mg | ORAL_TABLET | Freq: Every day | ORAL | 5 refills | Status: DC | PRN

## 2016-09-24 ENCOUNTER — Encounter: Payer: Self-pay | Admitting: Pulmonary Disease

## 2016-09-25 ENCOUNTER — Encounter: Payer: Self-pay | Admitting: Pulmonary Disease

## 2016-09-25 ENCOUNTER — Ambulatory Visit (INDEPENDENT_AMBULATORY_CARE_PROVIDER_SITE_OTHER): Payer: Medicare Other | Admitting: Pulmonary Disease

## 2016-09-25 VITALS — BP 138/90 | HR 98 | Ht 75.0 in | Wt 244.0 lb

## 2016-09-25 DIAGNOSIS — Z9989 Dependence on other enabling machines and devices: Secondary | ICD-10-CM

## 2016-09-25 DIAGNOSIS — G4733 Obstructive sleep apnea (adult) (pediatric): Secondary | ICD-10-CM

## 2016-09-25 NOTE — Assessment & Plan Note (Signed)
Your CPAP is set at 11 cm and seems to be working well Prescription will be given for a travel machine CPAP supplies will be renewed for a year  With this 50 pound weight loss since his original study, it is conceivable that his OSA is improved but he is still comfortable on his machine that he would like to continue  Weight loss encouraged, compliance with goal of at least 4-6 hrs every night is the expectation. Advised against medications with sedative side effects Cautioned against driving when sleepy - understanding that sleepiness will vary on a day to day basis

## 2016-09-25 NOTE — Progress Notes (Signed)
Subjective:    Patient ID: Luis BalesLawrence Fernandez, male    DOB: Oct 18, 1945, 71 y.o.   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 He is diabetic and hypertensive OSA was diagnosed 05/2004 with an overnight polysomnogram that showed RDI 16/hour with lowest desaturation of 84%. Weight was 290 pounds). On a subsequent titration study events were controlled with CPAP between 5 cm. Around 2013, CPAP was pressure was increased to 11 cm due to residual events on download. His serial download social Compliance with good control of events. This is his third machine which he obtained 2016, DME is [facility]. Epworth sleepiness score is 7 Bedtime is between 11 PM and midnight, sleep latency is about 30 minutes, he sleeps on his side with 2 pillows, reports 2-3 nocturnal awakenings without nocturia and is out of bed between 7 and 8 AM feeling rested without dryness of mouth or headaches. He is good about using the humidifier.  He denies using caffeinated beverages. There is no history suggestive of cataplexy, sleep paralysis or parasomnias  He is lost 50 pounds down to his current weight of 241 pounds   Past Medical History:  Diagnosis Date  . Diabetes mellitus without complication (HCC)   . Hypertension      Past Surgical History:  Procedure Laterality Date  . CARPAL TUNNEL RELEASE    . CATARACT EXTRACTION    . NOSE SURGERY    . TOTAL KNEE ARTHROPLASTY    . UMBILICAL HERNIA REPAIR     No Known Allergies   Social History   Social History  . Marital status: Married    Spouse name: N/A  . Number of children: N/A  . Years of education: N/A   Occupational History  . Not on file.   Social History Main Topics  . Smoking status: Former Games developermoker  . Smokeless tobacco: Never Used  . Alcohol use 0.0 oz/week  . Drug use: No  . Sexual activity: Not on file   Other Topics Concern  . Not on file   Social  History Narrative  . No narrative on file    Family History  Problem Relation Age of Onset  . Heart disease Mother   . Hypertension Mother   . Hypertension Brother   . Depression Brother      Review of Systems Constitutional: negative for anorexia, fevers and sweats  Eyes: negative for irritation, redness and visual disturbance  Ears, nose, mouth, throat, and face: negative for earaches, epistaxis, nasal congestion and sore throat  Respiratory: negative for cough, dyspnea on exertion, sputum and wheezing  Cardiovascular: negative for chest pain, dyspnea, lower extremity edema, orthopnea, palpitations and syncope  Gastrointestinal: negative for abdominal pain, constipation, diarrhea, melena, nausea and vomiting  Genitourinary:negative for dysuria, frequency and hematuria  Hematologic/lymphatic: negative for bleeding, easy bruising and lymphadenopathy  Musculoskeletal:negative for arthralgias, muscle weakness and stiff joints  Neurological: negative for coordination problems, gait problems, headaches and weakness  Endocrine: negative for diabetic symptoms including polydipsia, polyuria and weight loss     Objective:   Physical Exam  Gen. Pleasant, obese, in no distress, normal affect ENT - no lesions, no post nasal drip, class 2-3 airway Neck: No JVD, no thyromegaly, no carotid bruits Lungs: no use of accessory muscles, no dullness to percussion, decreased without rales or rhonchi  Cardiovascular: Rhythm regular, heart sounds  normal, no murmurs or gallops, no peripheral edema Abdomen: soft and non-tender, no hepatosplenomegaly,  BS normal. Musculoskeletal: No deformities, no cyanosis or clubbing Neuro:  alert, non focal, no tremors       Assessment & Plan:

## 2016-09-25 NOTE — Patient Instructions (Signed)
Your CPAP is set at 11 cm and seems to be working well Prescription will be given for a travel machine CPAP supplies will be renewed for a year

## 2016-10-11 ENCOUNTER — Encounter: Payer: Self-pay | Admitting: Family Medicine

## 2016-10-12 MED ORDER — ONETOUCH DELICA LANCETS 33G MISC: 99 refills | Status: DC

## 2016-10-12 MED ORDER — BD PEN NEEDLE NANO U/F 32G X 4 MM MISC: 99 refills | Status: DC

## 2016-10-12 MED ORDER — GLUCOSE BLOOD VI STRP: ORAL_STRIP | 99 refills | Status: DC

## 2016-10-15 ENCOUNTER — Encounter: Payer: Self-pay | Admitting: Family Medicine

## 2016-10-15 ENCOUNTER — Other Ambulatory Visit: Payer: Self-pay

## 2016-10-15 DIAGNOSIS — E1142 Type 2 diabetes mellitus with diabetic polyneuropathy: Secondary | ICD-10-CM

## 2016-10-15 MED ORDER — BD PEN NEEDLE NANO U/F 32G X 4 MM MISC: 99 refills | Status: DC

## 2016-10-15 MED ORDER — GLUCOSE BLOOD VI STRP: ORAL_STRIP | 99 refills | Status: DC

## 2016-10-15 MED ORDER — ONETOUCH DELICA LANCETS 33G MISC: 99 refills | Status: DC

## 2016-11-10 ENCOUNTER — Other Ambulatory Visit: Payer: Self-pay | Admitting: Family Medicine

## 2016-11-10 ENCOUNTER — Encounter: Payer: Self-pay | Admitting: Family Medicine

## 2016-11-10 MED ORDER — FENOFIBRATE 145 MG PO TABS: 145.0000 mg | ORAL_TABLET | Freq: Every day | ORAL | 0 refills | Status: DC

## 2016-11-11 MED ORDER — SCOPOLAMINE 1 MG/3DAYS TD PT72: 1.0000 | MEDICATED_PATCH | TRANSDERMAL | 3 refills | Status: DC

## 2016-11-30 ENCOUNTER — Other Ambulatory Visit: Payer: Self-pay | Admitting: Family Medicine

## 2016-12-11 ENCOUNTER — Encounter: Payer: Self-pay | Admitting: Family Medicine

## 2016-12-15 MED ORDER — CEPHALEXIN 500 MG PO CAPS: 2000.0000 mg | ORAL_CAPSULE | Freq: Once | ORAL | 3 refills | Status: DC

## 2016-12-29 ENCOUNTER — Encounter: Payer: Self-pay | Admitting: Family Medicine

## 2016-12-29 ENCOUNTER — Ambulatory Visit (INDEPENDENT_AMBULATORY_CARE_PROVIDER_SITE_OTHER): Payer: Medicare Other | Admitting: Family Medicine

## 2016-12-29 VITALS — BP 119/72 | HR 71 | Ht 75.0 in | Wt 241.0 lb

## 2016-12-29 DIAGNOSIS — Z0000 Encounter for general adult medical examination without abnormal findings: Secondary | ICD-10-CM

## 2016-12-29 DIAGNOSIS — Z96652 Presence of left artificial knee joint: Secondary | ICD-10-CM

## 2016-12-29 DIAGNOSIS — Z6830 Body mass index (BMI) 30.0-30.9, adult: Secondary | ICD-10-CM | POA: Diagnosis not present

## 2016-12-29 DIAGNOSIS — E1142 Type 2 diabetes mellitus with diabetic polyneuropathy: Secondary | ICD-10-CM

## 2016-12-29 DIAGNOSIS — E6609 Other obesity due to excess calories: Secondary | ICD-10-CM

## 2016-12-29 DIAGNOSIS — E785 Hyperlipidemia, unspecified: Secondary | ICD-10-CM

## 2016-12-29 DIAGNOSIS — R202 Paresthesia of skin: Secondary | ICD-10-CM | POA: Diagnosis not present

## 2016-12-29 DIAGNOSIS — E538 Deficiency of other specified B group vitamins: Secondary | ICD-10-CM | POA: Diagnosis not present

## 2016-12-29 DIAGNOSIS — I10 Essential (primary) hypertension: Secondary | ICD-10-CM

## 2016-12-29 DIAGNOSIS — G4733 Obstructive sleep apnea (adult) (pediatric): Secondary | ICD-10-CM

## 2016-12-29 LAB — LIPID PANEL W/REFLEX DIRECT LDL
CHOL/HDL RATIO: 3.8 ratio (ref <5.0)
Cholesterol: 127 mg/dL (ref <200)
HDL: 33 mg/dL — ABNORMAL LOW (ref >40)
LDL-CHOLESTEROL: 71 mg/dL
NON-HDL CHOLESTEROL (CALC): 94 mg/dL (ref <130)
Triglycerides: 147 mg/dL (ref <150)

## 2016-12-29 LAB — COMPLETE METABOLIC PANEL WITH GFR
ALT: 31 U/L (ref 9–46)
AST: 30 U/L (ref 10–35)
Albumin: 4.3 g/dL (ref 3.6–5.1)
Alkaline Phosphatase: 41 U/L (ref 40–115)
BUN: 19 mg/dL (ref 7–25)
CHLORIDE: 105 mmol/L (ref 98–110)
CO2: 22 mmol/L (ref 20–31)
CREATININE: 0.89 mg/dL (ref 0.70–1.18)
Calcium: 9.9 mg/dL (ref 8.6–10.3)
GFR, Est African American: >89 mL/min (ref >=60)
GFR, Est Non African American: 86 mL/min (ref >=60)
GLUCOSE: 113 mg/dL — AB (ref 65–99)
POTASSIUM: 4.3 mmol/L (ref 3.5–5.3)
SODIUM: 140 mmol/L (ref 135–146)
Total Bilirubin: 0.6 mg/dL (ref 0.2–1.2)
Total Protein: 6.6 g/dL (ref 6.1–8.1)

## 2016-12-29 LAB — CBC
HCT: 48 % (ref 38.5–50.0)
Hemoglobin: 16.5 g/dL (ref 13.2–17.1)
MCH: 32.4 pg (ref 27.0–33.0)
MCHC: 34.4 g/dL (ref 32.0–36.0)
MCV: 94.3 fL (ref 80.0–100.0)
MPV: 9.1 fL (ref 7.5–12.5)
PLATELETS: 255 10*3/uL (ref 140–400)
RBC: 5.09 MIL/uL (ref 4.20–5.80)
RDW: 13.8 % (ref 11.0–15.0)
WBC: 5.1 10*3/uL (ref 3.8–10.8)

## 2016-12-29 MED ORDER — ZOSTER VAC RECOMB ADJUVANTED 50 MCG/0.5ML IM SUSR: 0.5000 mL | Freq: Once | INTRAMUSCULAR | 1 refills | Status: AC

## 2016-12-29 NOTE — Progress Notes (Signed)
HPI: Luis Fernandez is a 71 y.o. male  who presents to [name] today, 12/29/16,  for Medicare Annual Wellness Exam  Patient presents for annual physical/Medicare wellness exam. no complaints today.   Past medical, surgical, social and family history reviewed:  Patient Active Problem List   Diagnosis Date Noted  . Paresthesia 06/23/2016  . OSA (obstructive sleep apnea) 06/23/2016  . B12 deficiency 01/21/2016  . 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 Hyperlipidemia LDL goal <70 12/17/2015  . Essential hypertension 12/17/2015  . Obese 12/17/2015  . 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 Insomnia 12/17/2015    Past Surgical History:  Procedure Laterality Date  . CARPAL TUNNEL RELEASE    . CATARACT EXTRACTION    . NOSE SURGERY    . TOTAL KNEE ARTHROPLASTY    . UMBILICAL HERNIA REPAIR      Social History   Social History  . Marital status: Married    Spouse name: N/A  . Number of children: N/A  . Years of education: N/A   Occupational History  . Not on file.   Social History Main Topics  . Smoking status: Former Games developer  . Smokeless tobacco: Never Used  . Alcohol use 0.0 oz/week  . Drug use: No  . Sexual activity: Not on file   Other Topics Concern  . Not on file   Social History Narrative  . No narrative on file    Family History  Problem Relation Age of Onset  . Heart disease Mother   . Hypertension Mother   . Hypertension Brother   . Depression Brother      Current medication list and allergy/intolerance information reviewed:    Outpatient Encounter Prescriptions as of 12/29/2016  Medication Sig  . azelastine (ASTELIN) 0.1 % nasal spray Place 1 spray into both nostrils 2 (two) times daily.  . BD PEN NEEDLE NANO U/F 32G X 4 MM MISC Inject into skin once daily. Diagnosis DM: E11.42  . benazepril-hydrochlorthiazide  (LOTENSIN HCT) 20-12.5 MG tablet Take 1 tablet by mouth daily.  . Cyanocobalamin (B-12) 500 MCG TABS Take by mouth.  . dapagliflozin propanediol (FARXIGA) 10 MG TABS tablet Take 10 mg by mouth daily.  . fenofibrate (TRICOR) 145 MG tablet Take 1 tablet (145 mg total) by mouth daily.  . Glucosamine 500 MG CAPS Take by mouth.  Marland Kitchen glucose blood (ONE TOUCH TEST STRIPS) test strip Check blood sugar 3 times daily. Diagnosis DM: E11.42  . ibuprofen (ADVIL,MOTRIN) 600 MG tablet Take 1 tablet (600 mg total) by mouth every 8 (eight) hours as needed.  . liraglutide (VICTOZA) 18 MG/3ML SOPN Inject 0.3 mLs (1.8 mg total) into the skin daily. Please include needles.  Marland Kitchen LORazepam (ATIVAN) 0.5 MG tablet Take 1 tablet (0.5 mg total) by mouth daily as needed (Cramps).  . MELATONIN PO Take by mouth.  . metFORMIN (GLUCOPHAGE-XR) 500 MG 24 hr tablet Take 2 tablets (1,000 mg total) by mouth 2 (two) times daily. Due for follow up visit  . Multiple Vitamins-Minerals (MULTIVITAMIN PO) Take by mouth.  . Omega-3 Fatty Acids (FISH OIL PO) Take by mouth.  [name] DELICA LANCETS 33G MISC Check blood sugar 3 times daily. Diagnosis DM: E11.42  . pravastatin (PRAVACHOL) 80 MG tablet Take 1 tablet (80 mg total) by mouth daily.  . temazepam (RESTORIL) 30 MG capsule Take 1 capsule (30 mg total) by mouth at bedtime as needed for  sleep.  . [DISCONTINUED] scopolamine (TRANSDERM-SCOP, 1.5 MG,) 1 MG/3DAYS Place 1 patch (1.5 mg total) onto the skin every 3 (three) days.  . Zoster Vac Recomb Adjuvanted [facility]) injection Inject 0.5 mLs into the muscle once. Give again after 2 months. If administered in clinic please fax report to Dr Denyse Amass [phone]   No facility-administered encounter medications on file as of 12/29/2016.     No Known Allergies     Review of Systems: No headache, visual changes, nausea, vomiting, diarrhea, constipation, dizziness, abdominal pain, skin rash, fevers, chills, night sweats, weight loss, swollen  lymph nodes, body aches, joint swelling, muscle aches, chest pain, shortness of breath, mood changes, visual or auditory hallucinations.     Medicare Wellness Questionnaire  Are there smokers in your home (other than you)? no  Depression screen [facility] 2/9 12/29/2016 03/18/2016 12/17/2015  Decreased Interest 0 0 0  Down, Depressed, Hopeless 0 0 0  PHQ - 2 Score 0 0 0        Activities of Daily Living In your present state of health, do you have any difficulty performing the following activities?:  Driving? no Managing money?  no Feeding yourself? no Getting from bed to chair? no Climbing a flight of stairs? no Preparing food and eating?: no Bathing or showering? no Getting dressed: no Getting to the toilet? no Using the toilet: no Moving around from place to place: no In the past year have you fallen or had a near fall?: no  Hearing Difficulties:  Do you often ask people to speak up or repeat themselves? yes Do you experience ringing or noises in your ears? no  Do you have difficulty understanding soft or whispered voices? yes  Memory Difficulties:  Do you feel that you have a problem with memory? no  Do you often misplace items? no  Do you feel safe at home?  yes  Sexual Health:   Are you sexually active?  Yes  Do you have more than one partner?  No   Risk Factors  Current exercise habits: Walking daily  Dietary issues discussed:yes low carbs  Cardiac risk factors: Yes   Exam:  BP 119/72   Pulse 71   Ht 6\' 3"  (1.905 m)   Wt 241 lb (109.3 kg)   BMI 30.12 kg/m  Vision by Snellen chart: right eye:see nurse notes, left eye:see nurse notes  Constitutional: VS see above. General Appearance: alert, well-developed, well-nourished, NAD  Ears, Nose, Mouth, Throat: MMM  Neck: No masses, trachea midline.   Respiratory: Normal respiratory effort. no wheeze, no rhonchi, no rales  Cardiovascular:No lower extremity edema.   Musculoskeletal: Gait normal. No  clubbing/cyanosis of digits.   Neurological: Normal balance/coordination. No tremor. Recalls 3 objects and able to read face of watch with correct time.   Skin: warm, dry, intact. No rash/ulcer.   Psychiatric: Normal judgment/insight. Normal mood and affect. Oriented x3.     ASSESSMENT/PLAN:   Encounter for Medicare annual wellness exam  Medicare annual wellness visit, subsequent  Essential hypertension - Plan: Vitamin B12, CBC, COMPLETE METABOLIC PANEL WITH GFR, Lipid Panel w/reflex Direct LDL, Hemoglobin A1c  OSA (obstructive sleep apnea) - Plan: Vitamin B12, CBC, COMPLETE METABOLIC PANEL WITH GFR, Lipid Panel w/reflex Direct LDL, Hemoglobin A1c  Paresthesia - Plan: Vitamin B12, CBC, COMPLETE METABOLIC PANEL WITH GFR, Lipid Panel w/reflex Direct LDL, Hemoglobin A1c  B12 deficiency - Plan: Vitamin B12, CBC, COMPLETE METABOLIC PANEL WITH GFR, Lipid Panel w/reflex Direct LDL, Hemoglobin A1c  Hyperlipidemia LDL goal <  70 - Plan: Vitamin B12, CBC, COMPLETE METABOLIC PANEL WITH GFR, Lipid Panel w/reflex Direct LDL, Hemoglobin A1c  Type 2 diabetes mellitus with diabetic polyneuropathy, without long-term current use of insulin (HCC) - Plan: Vitamin B12, CBC, COMPLETE METABOLIC PANEL WITH GFR, Lipid Panel w/reflex Direct LDL, Hemoglobin A1c  Status post total left knee replacement  Class 1 obesity due to excess calories with serious comorbidity and body mass index (BMI) of 30.0 to 30.9 in adult  Health Maintenance Health Maintenance  Topic Date Due  . HEMOGLOBIN A1C  12/21/2016  . COLONOSCOPY  05/02/2024 (Originally 01/15/1995)  . OPHTHALMOLOGY EXAM  02/24/2017  . INFLUENZA VACCINE  03/03/2017  . FOOT EXAM  03/18/2017  . TETANUS/TDAP  03/05/2019  . Hepatitis C Screening  Completed  . PNA vac Low Risk Adult  Completed    Immunization History  Administered Date(s) Administered  . Influenza-Unspecified 05/12/2016  . Pneumococcal Conjugate-13 06/22/2014  . Pneumococcal  Polysaccharide-23 02/20/2010  . Tdap 03/04/2009  . Zoster 01/01/2002     During the course of the visit the patient was educated and counseled about appropriate screening and preventive services as noted above.   Patient Instructions (the written plan) was given to the patient.  Medicare Attestation I have personally reviewed: The patient's medical and social history Their use of alcohol, tobacco or illicit drugs Their current medications and supplements The patient's functional ability including ADLs,fall risks, home safety risks, cognitive, and hearing and visual impairment Diet and physical activities Evidence for depression or mood disorders  The patient's weight, height, BMI, and visual acuity have been recorded in the chart.  I have made referrals, counseling, and provided education to the patient based on review of the above and I have provided the patient with a written personalized care plan for preventive services.

## 2016-12-29 NOTE — Patient Instructions (Signed)
Thank you for coming in today. Get labs today.  Get your shingles vaccine at the pharmacy.  Recheck every 6 months.  Return sooner if needed.   Continue to exercise.  You should get a cologuard kit soon.  Let me know if you do not get one.

## 2016-12-30 LAB — HEMOGLOBIN A1C
HEMOGLOBIN A1C: 5.8 % — AB (ref <5.7)
Mean Plasma Glucose: 120 mg/dL

## 2016-12-30 LAB — VITAMIN B12: VITAMIN B 12: 548 pg/mL (ref 200–1100)

## 2016-12-31 ENCOUNTER — Other Ambulatory Visit: Payer: Self-pay | Admitting: Family Medicine

## 2017-01-05 DIAGNOSIS — Z1211 Encounter for screening for malignant neoplasm of colon: Secondary | ICD-10-CM | POA: Diagnosis not present

## 2017-01-05 DIAGNOSIS — Z1212 Encounter for screening for malignant neoplasm of rectum: Secondary | ICD-10-CM | POA: Diagnosis not present

## 2017-01-05 LAB — COLOGUARD: COLOGUARD: NEGATIVE

## 2017-01-22 ENCOUNTER — Encounter: Payer: Self-pay | Admitting: Family Medicine

## 2017-02-08 ENCOUNTER — Other Ambulatory Visit: Payer: Self-pay | Admitting: Family Medicine

## 2017-02-25 ENCOUNTER — Other Ambulatory Visit: Payer: Self-pay | Admitting: Family Medicine

## 2017-03-01 DIAGNOSIS — Z7984 Long term (current) use of oral hypoglycemic drugs: Secondary | ICD-10-CM | POA: Diagnosis not present

## 2017-03-01 DIAGNOSIS — Z961 Presence of intraocular lens: Secondary | ICD-10-CM | POA: Diagnosis not present

## 2017-03-01 DIAGNOSIS — H43393 Other vitreous opacities, bilateral: Secondary | ICD-10-CM | POA: Diagnosis not present

## 2017-03-01 DIAGNOSIS — E119 Type 2 diabetes mellitus without complications: Secondary | ICD-10-CM | POA: Diagnosis not present

## 2017-03-01 LAB — HM DIABETES EYE EXAM

## 2017-03-08 ENCOUNTER — Encounter: Payer: Self-pay | Admitting: Osteopathic Medicine

## 2017-03-08 ENCOUNTER — Ambulatory Visit (INDEPENDENT_AMBULATORY_CARE_PROVIDER_SITE_OTHER): Payer: Medicare Other | Admitting: Osteopathic Medicine

## 2017-03-08 VITALS — BP 124/66 | HR 83 | Resp 18 | Wt 244.9 lb

## 2017-03-08 DIAGNOSIS — J019 Acute sinusitis, unspecified: Secondary | ICD-10-CM | POA: Diagnosis not present

## 2017-03-08 MED ORDER — CEPHALEXIN 500 MG PO CAPS: 500.0000 mg | ORAL_CAPSULE | Freq: Two times a day (BID) | ORAL | 0 refills | Status: DC

## 2017-03-08 NOTE — Progress Notes (Signed)
HPI: Luis Fernandez is a 71 y.o. male who [name] for chief complaint of: No chief complaint on file.  Acute Illness: . Location/Quality: sinus pressure and headache, no SOB/wheezing.  . Assoc signs/symptoms: see ROS . Duration: 7+ days . Modifying factors: has tried the following OTC/Rx medications: Sudafed, Azelastine nasal    Past medical, social and family history reviewed.  Immune compromising conditions or other risk factors: none except resp - OSA, other - DM2   Current medications and allergies reviewed.    Review of Systems:  Constitutional: No  fever/chills  HEENT: sinus and occipital headache, No  sore throat, No  swollen glands  Cardiovascular: No chest pain  Respiratory:No  cough, No  shortness of breath   Detailed Exam:  BP 124/66   Pulse 83   Resp 18   Wt 244 lb 14.4 oz (111.1 kg)   SpO2 98%   BMI 30.61 kg/m   Constitutional:   VSS, see above.   General Appearance: alert, well-developed, well-nourished, NAD  Eyes:   Normal lids and conjunctive, non-icteric sclera  Ears, Nose, Mouth, Throat:   Normal external inspection ears/nares  posterior pharynx without erythema, without exudate  nasal mucosa normal  Skin:  Normal inspection, no rash or concerning lesions noted on limited exam  Neck:   No masses, trachea midline. normal lymph nodes  Respiratory:   Normal respiratory effort.   No  wheeze/rhonchi/rales  Cardiovascular:   S1/S2 normal, no murmur/rub/gallop auscultated. RRR.      ASSESSMENT/PLAN: Pt requests Keflex as this has worked in the past for sinus infections. Advised if no better in 5-7 days on treatment call and at that time would consider augmentin or other. Declines other nasal spray or symptomatic treatment   Acute non-recurrent sinusitis, unspecified location - Plan: cephALEXin (KEFLEX) 500 MG capsule   Visit summary was printed for the patient with  medications and pertinent instructions for patient to review. ER/RTC precautions reviewed. All questions answered. Return for routine care as directed by PCP, sooner if needed or if symptoms worse/fail to improve .

## 2017-04-23 ENCOUNTER — Other Ambulatory Visit: Payer: Self-pay | Admitting: Family Medicine

## 2017-04-23 ENCOUNTER — Encounter: Payer: Self-pay | Admitting: Family Medicine

## 2017-05-04 ENCOUNTER — Encounter: Payer: Self-pay | Admitting: Family Medicine

## 2017-05-10 DIAGNOSIS — Z23 Encounter for immunization: Secondary | ICD-10-CM | POA: Diagnosis not present

## 2017-05-26 ENCOUNTER — Encounter: Payer: Self-pay | Admitting: Family Medicine

## 2017-05-26 ENCOUNTER — Ambulatory Visit (INDEPENDENT_AMBULATORY_CARE_PROVIDER_SITE_OTHER): Payer: Medicare Other | Admitting: Family Medicine

## 2017-05-26 VITALS — BP 116/69 | HR 75 | Wt 243.0 lb

## 2017-05-26 DIAGNOSIS — M65332 Trigger finger, left middle finger: Secondary | ICD-10-CM | POA: Diagnosis not present

## 2017-05-26 DIAGNOSIS — J0101 Acute recurrent maxillary sinusitis: Secondary | ICD-10-CM

## 2017-05-26 DIAGNOSIS — M6530 Trigger finger, unspecified finger: Secondary | ICD-10-CM | POA: Insufficient documentation

## 2017-05-26 MED ORDER — CEPHALEXIN 500 MG PO CAPS: 500.0000 mg | ORAL_CAPSULE | Freq: Three times a day (TID) | ORAL | 0 refills | Status: DC

## 2017-05-26 NOTE — Patient Instructions (Addendum)
Thank you for coming in today. Take keflex three times daily.  Recheck as needed.   Continue the double bandaid splint.   Call or go to the ER if you develop a large red swollen joint with extreme pain or oozing puss.   Recheck as scheduled.    Trigger Finger Trigger finger (stenosing tenosynovitis) is a condition that causes a finger to get stuck in a bent position. Each finger has a tough, cord-like tissue that connects muscle to bone (tendon), and each tendon is surrounded by a tunnel of tissue (tendon sheath). To move your finger, your tendon needs to slide freely through the sheath. Trigger finger happens when the tendon or the sheath thickens, making it difficult to move your finger. Trigger finger can affect any finger or a thumb. It may affect more than one finger. Mild cases may clear up with rest and medicine. Severe cases require more treatment. What are the causes? Trigger finger is caused by a thickened finger tendon or tendon sheath. The cause of this thickening is not known. What increases the risk? The following factors may make you more likely to develop this condition:  Doing activities that require a strong grip.  Having rheumatoid arthritis, gout, or diabetes.  Being 3240-72 years old.  Being a woman.  What are the signs or symptoms? Symptoms of this condition include:  Pain when bending or straightening your finger.  Tenderness or swelling where your finger attaches to the palm of your hand.  A lump in the palm of your hand or on the inside of your finger.  Hearing a popping sound when you try to straighten your finger.  Feeling a popping, catching, or locking sensation when you try to straighten your finger.  Being unable to straighten your finger.  How is this diagnosed? This condition is diagnosed based on your symptoms and a physical exam. How is this treated? This condition may be treated by:  Resting your finger and avoiding activities that make  symptoms worse.  Wearing a finger splint to keep your finger in a slightly bent position.  Taking NSAIDs to relieve pain and swelling.  Injecting medicine (steroids) into the tendon sheath to reduce swelling and irritation. Injections may need to be repeated.  Having surgery to open the tendon sheath. This may be done if other treatments do not work and you cannot straighten your finger. You may need physical therapy after surgery.  Follow these instructions at home:  Use moist heat to help reduce pain and swelling as told by your health care provider.  Rest your finger and avoid activities that make pain worse. Return to normal activities as told by your health care provider.  If you have a splint, wear it as told by your health care provider.  Take over-the-counter and prescription medicines only as told by your health care provider.  Keep all follow-up visits as told by your health care provider. This is important. Contact a health care provider if:  Your symptoms are not improving with home care. Summary  Trigger finger (stenosing tenosynovitis) causes your finger to get stuck in a bent position, and it can make it difficult and painful to straighten your finger.  This condition develops when a finger tendon or tendon sheath thickens.  Treatment starts with resting, wearing a splint, and taking NSAIDs.  In severe cases, surgery to open the tendon sheath may be needed. This information is not intended to replace advice given to you by your health care provider.  Make sure you discuss any questions you have with your health care provider. Document Released: 05/09/2004 Document Revised: 06/30/2016 Document Reviewed: 06/30/2016 Elsevier Interactive Patient Education  2017 [employer].

## 2017-05-26 NOTE — Progress Notes (Signed)
Luis BalesLawrence Fernandez is a 71 y.o. male who [name] Medicine today for sinus infection and trigger finger.  Sinus infection: Patient has history of recurrent acute sinusitis.  He notes a 3-week history of worsening pain and pressure bilateral sinuses worse on the right.  Symptoms are consistent with previous episodes of sinus infections.  He denies fevers chills nausea vomiting or diarrhea.  Additionally patient notes left hand trigger finger.  He describes a pain in the third MCP of the left hand associated with flexion of the PIP.  He notes occasionally the finger will get stuck in the flexed position and require him to open it.  He has had trigger thumb previously and notes his symptoms are consistent with that.  Symptoms have been ongoing now for about 3 weeks and have occurred without injury.  He has not tried any treatment yet.   Past Medical History:  Diagnosis Date  . Diabetes mellitus without complication (HCC)   . Hypertension   . 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 CARPAL TUNNEL RELEASE    . CATARACT EXTRACTION    . NOSE SURGERY    . TOTAL KNEE ARTHROPLASTY    . UMBILICAL HERNIA REPAIR     Social History  Substance Use Topics  . Smoking status: Former Games developermoker  . Smokeless tobacco: Never Used  . Alcohol use 0.0 oz/week   family history includes Depression in his brother; Heart disease in his mother; Hypertension in his brother and mother.  ROS as above:  Medications: Current Outpatient Prescriptions  Medication Sig Dispense Refill  . azelastine (ASTELIN) 0.1 % nasal spray Place 1 spray into both nostrils 2 (two) times daily. 30 mL 11  . BD PEN NEEDLE NANO U/F 32G X 4 MM MISC Inject into skin once daily. Diagnosis DM: E11.42 100 each PRN  .  benazepril-hydrochlorthiazide (LOTENSIN HCT) 20-12.5 MG tablet TAKE ONE TABLET BY MOUTH DAILY 90 tablet 0  . Cyanocobalamin (B-12) 500 MCG TABS Take by mouth.    . dapagliflozin propanediol (FARXIGA) 10 MG TABS tablet Take 10 mg by mouth daily. 90 tablet 3  . fenofibrate (TRICOR) 145 MG tablet TAKE ONE TABLET BY MOUTH DAILY 90 tablet 3  . Glucosamine 500 MG CAPS Take by mouth.    Marland Kitchen. glucose blood (ONE TOUCH TEST STRIPS) test strip Check blood sugar 3 times daily. Diagnosis DM: E11.42 300 each prn  . ibuprofen (ADVIL,MOTRIN) 600 MG tablet Take 1 tablet (600 mg total) by mouth every 8 (eight) hours as needed. 90 tablet 11  . liraglutide (VICTOZA) 18 MG/3ML SOPN Inject 0.3 mLs (1.8 mg total) into the skin daily. Please include needles. 9 mL 3  . LORazepam (ATIVAN) 0.5 MG tablet TAKE ONE TABLET BY MOUTH DAILY AS NEEDED (CRAMPS) 30 tablet 4  . MELATONIN PO Take by mouth.    . metFORMIN (GLUCOPHAGE-XR) 500 MG 24 hr tablet TAKE TWO TABLETS BY MOUTH TWICE A DAY **DUE FOR FOLLOW UP VISIT** 120 tablet 5  . Multiple Vitamins-Minerals (MULTIVITAMIN PO) Take by mouth.    . Omega-3 Fatty Acids (FISH OIL PO) Take by mouth.    [name]. ONETOUCH DELICA LANCETS 33G MISC Check blood sugar 3 times daily. Diagnosis DM: E11.42 300 each prn  . pravastatin (PRAVACHOL) 80 MG tablet Take 1 tablet (80 mg total) by mouth daily. 90 tablet 3  .  temazepam (RESTORIL) 30 MG capsule TAKE ONE CAPSULE BY MOUTH AT BEDTIME AS NEEDED FOR SLEEP 30 capsule 4  . cephALEXin (KEFLEX) 500 MG capsule Take 1 capsule (500 mg total) by mouth 3 (three) times daily. 30 capsule 0   No current facility-administered medications for this visit.    No Known Allergies  Health Maintenance Health Maintenance  Topic Date Due  . OPHTHALMOLOGY EXAM  02/24/2017  . INFLUENZA VACCINE  03/03/2017  . HEMOGLOBIN A1C  07/01/2017  . FOOT EXAM  12/29/2017  . TETANUS/TDAP  03/05/2019  . Fecal DNA (Cologuard)  01/06/2020  . Hepatitis C Screening  Completed  . PNA  vac Low Risk Adult  Completed     Exam:  BP 116/69   Pulse 75   Wt 243 lb (110.2 kg)   SpO2 97%   BMI 30.37 kg/m  Gen: Well NAD HEENT: EOMI,  MMM tender to palpation bilateral maxillary sinuses.  Clear nasal discharge.  Normal tympanic membranes normal posterior pharynx. Lungs: Normal work of breathing. CTABL Heart: RRR no MRG Abd: NABS, Soft. Nondistended, Nontender Exts: Brisk capillary refill, warm and well perfused.  MSK: Left hand normal-appearing nontender.  Normal motion with the exception of third digit PIP flexion which is somewhat stiff and painful.  Limited musculoskeletal ultrasound of the left third digit flexor tendon at the A1 pulley reveals a hypoechoic small cystic structure consistent with a ganglion cyst within the pulley.  Normal bony structures.  Procedure: Real-time Ultrasound Guided Injection of left 3rd digit trigger finger  Device: GE Logiq E  Images permanently stored and available for review in the ultrasound unit. Verbal informed consent obtained. Discussed risks and benefits of procedure. Warned about infection bleeding damage to structures skin hypopigmentation and fat atrophy among others. Patient expresses understanding and agreement Time-out conducted.  Noted no overlying erythema, induration, or other signs of local infection.  Skin prepped in a sterile fashion.  Local anesthesia: Topical Ethyl chloride.  With sterile technique and under real time ultrasound guidance: 0.5 ml lidocaine and 5mg  dexamethasone injected easily.  Completed without difficulty  Pain immediately resolved suggesting accurate placement of the medication.  Advised to call if fevers/chills, erythema, induration, drainage, or persistent bleeding.  Images permanently stored and available for review in the ultrasound unit.  Impression: Technically successful ultrasound guided injection.     No results found for this or any previous visit (from the past 72  hour(s)). No results found.    Assessment and Plan: 71 y.o. male with  Left hand trigger finger with possible ganglion cyst.  This was injected today relieving symptoms.  Plan for double Band-Aid splint and recheck as needed.  Sinusitis likely bacterial.  Start treatment with Keflex as this is worked well in the past.  Engineer, maintenance (IT) as needed.   No orders of the defined types were placed in this encounter.  Meds ordered this encounter  Medications  . cephALEXin (KEFLEX) 500 MG capsule    Sig: Take 1 capsule (500 mg total) by mouth 3 (three) times daily.    Dispense:  30 capsule    Refill:  0     Discussed warning signs or symptoms. Please see discharge instructions. Patient expresses understanding.

## 2017-05-27 NOTE — Progress Notes (Signed)
Form placed in appropriate basket to be faxed.

## 2017-06-14 ENCOUNTER — Other Ambulatory Visit: Payer: Self-pay | Admitting: Family Medicine

## 2017-06-14 DIAGNOSIS — E785 Hyperlipidemia, unspecified: Secondary | ICD-10-CM

## 2017-06-29 ENCOUNTER — Encounter: Payer: Self-pay | Admitting: Family Medicine

## 2017-06-29 ENCOUNTER — Ambulatory Visit (INDEPENDENT_AMBULATORY_CARE_PROVIDER_SITE_OTHER): Payer: Medicare Other | Admitting: Family Medicine

## 2017-06-29 ENCOUNTER — Other Ambulatory Visit: Payer: Self-pay | Admitting: Family Medicine

## 2017-06-29 VITALS — BP 113/74 | HR 75 | Ht 75.0 in | Wt 244.0 lb

## 2017-06-29 DIAGNOSIS — M65332 Trigger finger, left middle finger: Secondary | ICD-10-CM | POA: Diagnosis not present

## 2017-06-29 DIAGNOSIS — E1142 Type 2 diabetes mellitus with diabetic polyneuropathy: Secondary | ICD-10-CM | POA: Diagnosis not present

## 2017-06-29 DIAGNOSIS — J0101 Acute recurrent maxillary sinusitis: Secondary | ICD-10-CM

## 2017-06-29 LAB — POCT GLYCOSYLATED HEMOGLOBIN (HGB A1C): HEMOGLOBIN A1C: 6.1

## 2017-06-29 MED ORDER — BENAZEPRIL-HYDROCHLOROTHIAZIDE 20-12.5 MG PO TABS: 1.0000 | ORAL_TABLET | Freq: Every day | ORAL | 3 refills | Status: DC

## 2017-06-29 MED ORDER — AZITHROMYCIN 250 MG PO TABS: 250.0000 mg | ORAL_TABLET | Freq: Every day | ORAL | 0 refills | Status: DC

## 2017-06-29 MED ORDER — CEFDINIR 300 MG PO CAPS: 300.0000 mg | ORAL_CAPSULE | Freq: Two times a day (BID) | ORAL | 0 refills | Status: DC

## 2017-06-29 NOTE — Progress Notes (Signed)
Luis Fernandez is a 71 y.o. male who [facility]: Primary Care Sports Medicine today for multiple complaints including trigger finger, chronic sinusitis, diabetes check, and regular health maintenance.   In terms of trigger finger, patient had injection 1 month ago. Patient improved significantly in terms of pain and saw some improvement in range of motion. He is hoping to have second injection today.   Chronic sinus infection: patient has a long history of recurrent chronic sinus infections. Was treated 1 month ago with keflex for sinus infection. Patient believes that symptoms improved and eventually resolved by the end of that antibiotic course. Patient returns today with approximately 2 weeks of sinus congestion, headaches, post-nasal drip, and productive cough. Patient denies fever. Patient concerned and would like to be treated, especially since he has had a knee replacement.   Health maintenance: patient's A1C is 6.1 today. He checks is glucose at home and finds his levels to typically be within 100s-140s. Only get about 150 when he is sick. He had an eye exam recently, and believes he had the records sent over.    Past Medical History:  Diagnosis Date  . Diabetes mellitus without complication (HCC)   . Hypertension   . 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 CARPAL TUNNEL RELEASE    . CATARACT EXTRACTION    . NOSE SURGERY    . TOTAL KNEE ARTHROPLASTY    . UMBILICAL HERNIA REPAIR     Social History   Tobacco Use  . Smoking status: Former Games developermoker  . Smokeless tobacco: Never Used  Substance Use Topics  . Alcohol use: Yes    Alcohol/week: 0.0 oz   family history includes Depression in his brother; Heart disease in his mother; Hypertension in his brother and mother.  ROS as  above:  Medications: Current Outpatient Medications  Medication Sig Dispense Refill  . azelastine (ASTELIN) 0.1 % nasal spray Place 1 spray into both nostrils 2 (two) times daily. 30 mL 11  . BD PEN NEEDLE NANO U/F 32G X 4 MM MISC Inject into skin once daily. Diagnosis DM: E11.42 100 each PRN  . benazepril-hydrochlorthiazide (LOTENSIN HCT) 20-12.5 MG tablet Take 1 tablet by mouth daily. 90 tablet 3  . cephALEXin (KEFLEX) 500 MG capsule Take 1 capsule (500 mg total) by mouth 3 (three) times daily. 30 capsule 0  . Cyanocobalamin (B-12) 500 MCG TABS Take by mouth.    . dapagliflozin propanediol (FARXIGA) 10 MG TABS tablet Take 10 mg by mouth daily. 90 tablet 3  . fenofibrate (TRICOR) 145 MG tablet TAKE ONE TABLET BY MOUTH DAILY 90 tablet 3  . Glucosamine 500 MG CAPS Take by mouth.    Marland Kitchen. glucose blood (ONE TOUCH TEST STRIPS) test strip Check blood sugar 3 times daily. Diagnosis DM: E11.42 300 each prn  . ibuprofen (ADVIL,MOTRIN) 600 MG tablet Take 1 tablet (600 mg total) by mouth every 8 (eight) hours as needed. 90 tablet 11  . liraglutide (VICTOZA) 18 MG/3ML SOPN Inject 0.3 mLs (1.8 mg total) into the skin daily. Please include needles. 9 mL 3  . LORazepam (ATIVAN) 0.5 MG tablet TAKE ONE TABLET BY MOUTH DAILY AS NEEDED (CRAMPS) 30 tablet 4  . MELATONIN PO Take by mouth.    . metFORMIN (GLUCOPHAGE-XR) 500 MG 24 hr tablet TAKE TWO TABLETS BY MOUTH TWICE A DAY **DUE FOR FOLLOW UP VISIT**  120 tablet 5  . Multiple Vitamins-Minerals (MULTIVITAMIN PO) Take by mouth.    . Omega-3 Fatty Acids (FISH OIL PO) Take by mouth.    [name]. ONETOUCH DELICA LANCETS 33G MISC Check blood sugar 3 times daily. Diagnosis DM: E11.42 300 each prn  . pravastatin (PRAVACHOL) 80 MG tablet TAKE ONE TABLET BY MOUTH DAILY 90 tablet 0  . temazepam (RESTORIL) 30 MG capsule TAKE ONE CAPSULE BY MOUTH AT BEDTIME AS NEEDED FOR SLEEP 30 capsule 4  . azithromycin (ZITHROMAX) 250 MG tablet Take 1 tablet (250 mg total) by mouth daily. Take  first 2 tablets together, then 1 every day until finished. 6 tablet 0  . cefdinir (OMNICEF) 300 MG capsule Take 1 capsule (300 mg total) by mouth 2 (two) times daily. 14 capsule 0   No current facility-administered medications for this visit.    No Known Allergies  Health Maintenance Health Maintenance  Topic Date Due  . OPHTHALMOLOGY EXAM  02/24/2017  . HEMOGLOBIN A1C  07/01/2017  . FOOT EXAM  12/29/2017  . TETANUS/TDAP  03/05/2019  . Fecal DNA (Cologuard)  01/06/2020  . INFLUENZA VACCINE  Completed  . Hepatitis C Screening  Completed  . PNA vac Low Risk Adult  Completed     Exam:  BP 113/74   Pulse 75   Ht 6\' 3"  (1.905 m)   Wt 244 lb (110.7 kg)   BMI 30.50 kg/m  Gen: Well NAD HEENT: EOMI,  MMM. Ears with impacted cerumen. Nose with minimally drainage, erythema. Throat clear without exudate. Facial pressure and pain primarily over maxillary sinuses bilaterally. Lungs: Normal work of breathing. CTABL Heart: RRR no MRG Abd: NABS, Soft. Nondistended, Nontender Exts: Brisk capillary refill, warm and well perfused.  L. Hand: Normal in appearance without swelling or overlying erythema. No pain to palpation. Slightly limited ROM at 3rd MCP.   L hand:  US guided joint injection revealed a small hypoechoic cystic structure. Improved from prior. Likely residual ganglion cyst.   Procedure: Real-time Ultrasound Guided Injection of left 3rd digit trigger finger  Device: GE Logiq E  Images permanently stored and available for review in the ultrasound unit. Verbal informed consent obtained. Discussed risks and benefits of procedure. Warned about infection bleeding damage to structures skin hypopigmentation and fat atrophy among others. Patient expresses understanding and agreement Time-out conducted.  Noted no overlying erythema, induration, or other signs of local infection.  Skin prepped in a sterile fashion.  Local anesthesia: Topical Ethyl chloride.  With sterile  technique and under real time ultrasound guidance: 0.5 ml lidocaine and 5mg  dexamethasone injected easily.  Completed without difficulty  Pain immediately resolved suggesting accurate placement of the medication.  Advised to call if fevers/chills, erythema, induration, drainage, or persistent bleeding.  Images permanently stored and available for review in the ultrasound unit.  Impression: Technically successful ultrasound guided injection.   Results for orders placed or performed in visit on 06/29/17 (from the past 72 hour(s))  POCT HgB A1C     Status: None   Collection Time: 06/29/17  9:51 AM  Result Value Ref Range   Hemoglobin A1C 6.1    No results found.  Assessment and Plan: 71 y.o. male with  Left trigger finger and possible ganglion cyst. Performed injection today to minimize size of cyst and improve ROM. Pain and ROM significantly improved from prior. Patient would like to avoid surgery if possible.   Sinusitis: likely separate sinus infection, but being only 1 month after previous infection, undertreatment is a  possibility. Prescribed omnicef + azithromycin to improve bacterial culture, given sinus infection just 4 weeks ago.   Health maintenance:  Diabetes:  Patient had eye exam recently. Need records, but should be up to date. A1C good today at 6.1. Will continue current regimin.   HTN: well controlled on current medication. Refilled lotensin today.   Orders Placed This Encounter  Procedures  . POCT HgB A1C   Meds ordered this encounter  Medications  . cefdinir (OMNICEF) 300 MG capsule    Sig: Take 1 capsule (300 mg total) by mouth 2 (two) times daily.    Dispense:  14 capsule    Refill:  0  . azithromycin (ZITHROMAX) 250 MG tablet    Sig: Take 1 tablet (250 mg total) by mouth daily. Take first 2 tablets together, then 1 every day until finished.    Dispense:  6 tablet    Refill:  0  . benazepril-hydrochlorthiazide (LOTENSIN HCT) 20-12.5 MG tablet    Sig:  Take 1 tablet by mouth daily.    Dispense:  90 tablet    Refill:  3     Discussed warning signs or symptoms. Please see discharge instructions. Patient expresses understanding.

## 2017-06-29 NOTE — Patient Instructions (Addendum)
Thank you for coming in today. Call or go to the ER if you develop a large red swollen joint with extreme pain or oozing puss.  Continue double bandaid splint.  Return in 3 months.  Return sooner if needed.  Take both omnicef and azithromycin.

## 2017-06-30 MED ORDER — CEPHALEXIN 500 MG PO CAPS: 500.0000 mg | ORAL_CAPSULE | Freq: Three times a day (TID) | ORAL | 1 refills | Status: DC

## 2017-07-07 ENCOUNTER — Other Ambulatory Visit: Payer: Self-pay | Admitting: Family Medicine

## 2017-07-14 ENCOUNTER — Encounter: Payer: Self-pay | Admitting: Family Medicine

## 2017-07-14 MED ORDER — METFORMIN HCL ER 500 MG PO TB24: 1000.0000 mg | ORAL_TABLET | Freq: Two times a day (BID) | ORAL | 3 refills | Status: DC

## 2017-07-16 ENCOUNTER — Encounter: Payer: Self-pay | Admitting: Family Medicine

## 2017-07-18 ENCOUNTER — Other Ambulatory Visit: Payer: Self-pay | Admitting: Family Medicine

## 2017-08-07 ENCOUNTER — Other Ambulatory Visit: Payer: Self-pay | Admitting: Family Medicine

## 2017-09-02 ENCOUNTER — Ambulatory Visit (INDEPENDENT_AMBULATORY_CARE_PROVIDER_SITE_OTHER): Payer: Medicare Other | Admitting: Sports Medicine

## 2017-09-02 DIAGNOSIS — S39012A Strain of muscle, fascia and tendon of lower back, initial encounter: Secondary | ICD-10-CM | POA: Insufficient documentation

## 2017-09-02 MED ORDER — MELOXICAM 15 MG PO TABS: ORAL_TABLET | ORAL | 3 refills | Status: DC

## 2017-09-02 MED ORDER — PREDNISONE 50 MG PO TABS: ORAL_TABLET | ORAL | 0 refills | Status: DC

## 2017-09-02 NOTE — Progress Notes (Signed)
Subjective:    I'm seeing this patient as a consultation for: Dr. [name]  CC: Low back pain  HPI: Couple of weeks ago this pleasant 72 year old male was building some new shelves [place]  Unfortunately after he built the shelves he started to have some pain in his low back, dull, aching, right-sided.  Radiation to the lateral thigh, but nothing past the knee.  No bowel or bladder dysfunction, saddle numbness, constitutional symptoms.  Worse with sitting and flexion.  Not worse with Valsalva.  Moderate, persistent.  I reviewed the past medical history, family history, social history, surgical history, and allergies today and no changes were needed.  Please see the problem list section below in epic for further details.  Past Medical History: Past Medical History:  Diagnosis Date  . Diabetes mellitus without complication (HCC)   . Hypertension   . 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 CARPAL TUNNEL RELEASE    . CATARACT EXTRACTION    . NOSE SURGERY    . TOTAL KNEE ARTHROPLASTY    . UMBILICAL HERNIA REPAIR     Social History: Social History   Socioeconomic History  . Marital status: Married    Spouse name: Not on file  . Number of children: Not on file  . Years of education: Not on file  . Highest education level: Not on file  Social Needs  . Financial resource strain: Not on file  . Food insecurity - worry: Not on file  . Food insecurity - inability: Not on file  . Transportation needs - medical: Not on file  . Transportation needs - non-medical: Not on file  Occupational History  . Not on file  Tobacco Use  . Smoking status: Former Games developer  . Smokeless tobacco: Never Used  Substance and Sexual Activity  . Alcohol use: Yes    Alcohol/week: 0.0 oz  . Drug use: No  . Sexual activity: Not on file  Other  Topics Concern  . Not on file  Social History Narrative  . Not on file   Family History: Family History  Problem Relation Age of Onset  . Heart disease Mother   . Hypertension Mother   . Hypertension Brother   . Depression Brother    Allergies: No Known Allergies Medications: See med rec.  Review of Systems: No headache, visual changes, nausea, vomiting, diarrhea, constipation, dizziness, abdominal pain, skin rash, fevers, chills, night sweats, weight loss, swollen lymph nodes, body aches, joint swelling, muscle aches, chest pain, shortness of breath, mood changes, visual or auditory hallucinations.   Objective:   General: Well Developed, well nourished, and in no acute distress.  Neuro:  Extra-ocular muscles intact, able to move all 4 extremities, sensation grossly intact.  Deep tendon reflexes tested were normal. Psych: Alert and oriented, mood congruent with affect. ENT:  Ears and nose appear unremarkable.  Hearing grossly normal. Neck: Unremarkable overall appearance, trachea midline.  No visible thyroid enlargement. Eyes: Conjunctivae and lids appear unremarkable.  Pupils equal and round. Skin: Warm and dry, no rashes noted.  Cardiovascular: Pulses palpable, no extremity edema. Back Exam:  Inspection: Unremarkable  Motion: Flexion 45 deg, Extension 45 deg, Side Bending to 45 deg bilaterally,  Rotation to 45 deg bilaterally  SLR laying: Negative  XSLR laying: Negative  Palpable tenderness: None. FABER: negative. Sensory change: Gross sensation  intact to all lumbar and sacral dermatomes.  Reflexes: 2+ at both patellar tendons, 2+ at achilles tendons, Babinski's downgoing.  Strength at foot  Plantar-flexion: 5/5 Dorsi-flexion: 5/5 Eversion: 5/5 Inversion: 5/5  Leg strength  Quad: 5/5 Hamstring: 5/5 Hip flexor: 5/5 Hip abductors: 5/5  Gait unremarkable.  Impression and Recommendations:   This case required medical decision making of moderate complexity.  Lumbar  strain Right paralumbar strain after billing some shelves. Prednisone, meloxicam, rehab exercises given. No red flags, nothing radicular, return to see us if no better in 3-4 weeks. He did request a trigger point injection today, he is agreeable to defer this until after oral treatment has been ineffective.  ___________________________________________ Ihor Austinhomas J. Benjamin Stainhekkekandam, M.D., ABFM., CAQSM. Primary Care and Sports Medicine [facility]  Adjunct Instructor of Family Medicine  [employer]

## 2017-09-02 NOTE — Assessment & Plan Note (Signed)
Right paralumbar strain after billing some shelves. Prednisone, meloxicam, rehab exercises given. No red flags, nothing radicular, return to see us if no better in 3-4 weeks. He did request a trigger point injection today, he is agreeable to defer this until after oral treatment has been ineffective.

## 2017-09-07 ENCOUNTER — Telehealth: Payer: Self-pay

## 2017-09-07 MED ORDER — PREDNISONE 10 MG (48) PO TBPK
ORAL_TABLET | Freq: Every day | ORAL | 0 refills | Status: DC
Start: 2017-09-07 — End: 2017-09-27

## 2017-09-07 NOTE — Telephone Encounter (Signed)
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 This has happened to him before when he was on prednisone in the past. He states he needs a step down dose. He is having sweats and chills.  Please advise.

## 2017-09-07 NOTE — Telephone Encounter (Signed)
Patient advised.

## 2017-09-07 NOTE — Telephone Encounter (Signed)
No problem, 12-day taper called in.

## 2017-09-11 ENCOUNTER — Other Ambulatory Visit: Payer: Self-pay | Admitting: Family Medicine

## 2017-09-11 DIAGNOSIS — E785 Hyperlipidemia, unspecified: Secondary | ICD-10-CM

## 2017-09-12 ENCOUNTER — Other Ambulatory Visit: Payer: Self-pay | Admitting: Family Medicine

## 2017-09-27 ENCOUNTER — Encounter: Payer: Self-pay | Admitting: Family Medicine

## 2017-09-27 ENCOUNTER — Ambulatory Visit (INDEPENDENT_AMBULATORY_CARE_PROVIDER_SITE_OTHER): Payer: Medicare Other | Admitting: Family Medicine

## 2017-09-27 VITALS — BP 130/80 | HR 87 | Ht 75.0 in | Wt 241.0 lb

## 2017-09-27 DIAGNOSIS — S39012A Strain of muscle, fascia and tendon of lower back, initial encounter: Secondary | ICD-10-CM

## 2017-09-27 DIAGNOSIS — E1142 Type 2 diabetes mellitus with diabetic polyneuropathy: Secondary | ICD-10-CM | POA: Diagnosis not present

## 2017-09-27 LAB — POCT GLYCOSYLATED HEMOGLOBIN (HGB A1C): HEMOGLOBIN A1C: 7.1

## 2017-09-27 MED ORDER — METHOCARBAMOL 500 MG PO TABS: 500.0000 mg | ORAL_TABLET | Freq: Every evening | ORAL | 0 refills | Status: DC | PRN

## 2017-09-27 NOTE — Progress Notes (Signed)
Luis Fernandez is a 72 y.o. male who presents to [name] Primary Care [facility] Medicine today for back pain, diabetes.   Back Pain: [name] does not typically have chronic back pain.  He was in his normal state of health a few weeks ago.  He was constructing some shelving at his house and felt a pulling sensation in his back.  He was seen by my partner Dr. Benjamin Stain on September 02, 2017 who suspected low back strain. Dr. Benjamin Stain prescribed prednisone and meloxicam at home exercise program.  This helped a little bit the back pain persists. [name] notes pain in his right low back referring down to his buttocks and lateral hips but not radiating down his legs.  He denies any weakness or numbness or bowel bladder dysfunction.  He denies any fevers or chills.  The pain is worse at bedtime and better after moving around a bit.  He notes stiffness and some pain worse with activity as well.  He has had episodes like this in the past that were thought to be muscle spasms.  He is done well in the past with trigger point injections and is interested in once a day.  Diabetes: [name] has a history of type 2 diabetes that typically has been pretty well controlled.  He notes that with the prednisone and pain his blood sugars have been a little more elevated than usual into the high 100s or low 200s.  He takes the medications listed below denies significant polyuria or polydipsia.  Past Medical History:  Diagnosis Date  . Diabetes mellitus without complication (HCC)   . Hypertension   . 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 CARPAL TUNNEL RELEASE    . CATARACT EXTRACTION    . NOSE SURGERY    . TOTAL KNEE ARTHROPLASTY    . UMBILICAL HERNIA REPAIR     Social History   Tobacco Use  . Smoking status: Former  Games developer  . Smokeless tobacco: Never Used  Substance Use Topics  . Alcohol use: Yes    Alcohol/week: 0.0 oz   family history includes Depression in his brother; Heart disease in his mother; Hypertension in his brother and mother.  ROS as above:  Medications: Current Outpatient Medications  Medication Sig Dispense Refill  . azelastine (ASTELIN) 0.1 % nasal spray SPRAY ONE SPRAY IN THE BOTH NOSTRILS TWICE A DAY 30 mL 10  . BD PEN NEEDLE NANO U/F 32G X 4 MM MISC Inject into skin once daily. Diagnosis DM: E11.42 100 each PRN  . benazepril-hydrochlorthiazide (LOTENSIN HCT) 20-12.5 MG tablet Take 1 tablet by mouth daily. 90 tablet 3  . FARXIGA 10 MG TABS tablet TAKE ONE TABLET BY MOUTH DAILY 90 tablet 2  . fenofibrate (TRICOR) 145 MG tablet TAKE ONE TABLET BY MOUTH DAILY 90 tablet 3  . Glucosamine 500 MG CAPS Take by mouth.    Marland Kitchen glucose blood (ONE TOUCH TEST STRIPS) test strip Check blood sugar 3 times daily. Diagnosis DM: E11.42 300 each prn  . ibuprofen (ADVIL,MOTRIN) 600 MG tablet Take 1 tablet (600 mg total) by mouth every 8 (eight) hours as needed. 90 tablet 11  . LORazepam (ATIVAN) 0.5 MG tablet TAKE ONE TABLET BY MOUTH DAILY AS NEEDED (CRAMPS) 30 tablet 3  . MELATONIN PO Take by mouth.    . metFORMIN (GLUCOPHAGE-XR) 500 MG 24 hr tablet Take 2 tablets (  1,000 mg total) by mouth 2 (two) times daily. 360 tablet 3  . Multiple Vitamins-Minerals (MULTIVITAMIN PO) Take by mouth.    . Omega-3 Fatty Acids (FISH OIL PO) Take by mouth.    [name] DELICA LANCETS 33G MISC Check blood sugar 3 times daily. Diagnosis DM: E11.42 300 each prn  . pravastatin (PRAVACHOL) 80 MG tablet TAKE ONE TABLET BY MOUTH DAILY 90 tablet 0  . temazepam (RESTORIL) 30 MG capsule TAKE ONE CAPSULE BY MOUTH AT BEDTIME AS NEEDED FOR SLEEP 30 capsule 3  . VICTOZA 18 MG/3ML SOPN DIAL AND INJECT SUBCUTANEOUSLY 1.8MG  DAILY 27 mL 2  . methocarbamol (ROBAXIN) 500 MG tablet Take 1 tablet (500 mg total) by mouth at bedtime as needed  for muscle spasms. 30 tablet 0   No current facility-administered medications for this visit.    No Known Allergies  Health Maintenance Health Maintenance  Topic Date Due  . HEMOGLOBIN A1C  12/27/2017  . FOOT EXAM  12/29/2017  . OPHTHALMOLOGY EXAM  03/01/2018  . TETANUS/TDAP  03/05/2019  . Fecal DNA (Cologuard)  01/06/2020  . INFLUENZA VACCINE  Completed  . Hepatitis C Screening  Completed  . PNA vac Low Risk Adult  Completed     Exam:  BP 130/80   Pulse 87   Ht 6\' 3"  (1.905 m)   Wt 241 lb (109.3 kg)   BMI 30.12 kg/m  Gen: Well NAD HEENT: EOMI,  MMM Lungs: Normal work of breathing. CTABL Heart: RRR no MRG Abd: NABS, Soft. Nondistended, Nontender Exts: Brisk capillary refill, warm and well perfused.  L-spine: Mild erythematous patch at the midline lumbar spine where he has been using heating pad. Tender to palpation right lumbar paraspinal muscle group. Range of motion limited with stiffness and rotation and lateral flexion and extension.  Normal flexion. Normal lower extremity strength bilaterally. Reflexes and sensation are equal normal bilaterally. Negative slump test bilaterally  Trigger point injection: Consent obtained and timeout performed. The area of maximal tenderness at the right lumbar paraspinal muscle group identified.  This was well away from the patch of erythema. The skin was cleaned with rubbing alcohol and chlorhexidine.  Cold spray was then applied and 1 mL of Marcaine and 40 mg of triamcinolone were injected into the muscle through the fascia and the paraspinal muscles care was taken to avoid deep injection.  Patient tolerated the procedure well and noted considerable improvement in pain following injection.   Results for orders placed or performed in visit on 09/27/17 (from the past 72 hour(s))  POCT HgB A1C     Status: None   Collection Time: 09/27/17 10:38 AM  Result Value Ref Range   Hemoglobin A1C 7.1    No results found.    Assessment  and Plan: 72 y.o. male with new episode of low back pain due to lumbosacral strain and myofascial dysfunction.  The patient continues to be bothered by this after about 3 weeks.  Plan to advance the treatment with trigger point injection as above, referral to physical therapy, methocarbamol as needed at bedtime and TENS unit.  I think the reddish spot on his back is because of a superficial skin burn due to heating pads.  We discussed heating pad safety.  Recheck if not better in a few weeks.  Diabetes: Slightly worse recently likely due to steroids.  I anticipate normalization of blood sugar when he is feeling better.  We will recheck on this issue in about 3 months or sooner if needed.  Orders Placed This Encounter  Procedures  . Ambulatory referral to Physical Therapy    Referral Priority:   Routine    Referral Type:   Physical Medicine    Referral Reason:   Specialty Services Required    Requested Specialty:   Physical Therapy  . POCT HgB A1C   Meds ordered this encounter  Medications  . methocarbamol (ROBAXIN) 500 MG tablet    Sig: Take 1 tablet (500 mg total) by mouth at bedtime as needed for muscle spasms.    Dispense:  30 tablet    Refill:  0     Discussed warning signs or symptoms. Please see discharge instructions. Patient expresses understanding.

## 2017-09-27 NOTE — Patient Instructions (Addendum)
Thank you for coming in today. Call or go to the ER if you develop a large red swollen joint with extreme pain or oozing puss.  Attend PT.  Use muscle relaxer sparingly.  STOP meloxicam.   TENS UNIT: This is helpful for muscle pain and spasm.   Search and Purchase a TENS 7000 2nd edition at  www.tenspros.com or www.Amazon.com It should be less than $30.     TENS unit instructions: Do not shower or bathe with the unit on Turn the unit off before removing electrodes or batteries If the electrodes lose stickiness add a drop of water to the electrodes after they are disconnected from the unit and place on plastic sheet. If you continued to have difficulty, call the TENS unit company to purchase more electrodes. Do not apply lotion on the skin area prior to use. Make sure the skin is clean and dry as this will help prolong the life of the electrodes. After use, always check skin for unusual red areas, rash or other skin difficulties. If there are any skin problems, does not apply electrodes to the same area. Never remove the electrodes from the unit by pulling the wires. Do not use the TENS unit or electrodes other than as directed. Do not change electrode placement without consultating your therapist or physician. Keep 2 fingers with between each electrode. Wear time ratio is 2:1, on to off times.    For example on for 30 minutes off for 15 minutes and then on for 30 minutes off for 15 minutes     Lumbosacral Strain Lumbosacral strain is an injury that causes pain in the lower back (lumbosacral spine). This injury usually occurs from overstretching the muscles or ligaments along your spine. A strain can affect one or more muscles or cord-like tissues that connect bones to other bones (ligaments). What are the causes? This condition may be caused by:  A hard, direct hit (blow) to the back.  Excessive stretching of the lower back muscles. This may result from: ? A fall. ? Lifting  something heavy. ? Repetitive movements such as bending or crouching.  What increases the risk? The following factors may increase your risk of getting this condition:  Participating in sports or activities that involve: ? A sudden twist of the back. ? Pushing or pulling motions.  Being overweight or obese.  Having poor strength and flexibility, especially tight hamstrings or weak muscles in the back or abdomen.  Having too much of a curve in the lower back.  Having a pelvis that is tilted forward.  What are the signs or symptoms? The main symptom of this condition is pain in the lower back, at the site of the strain. Pain may extend (radiate) down one or both legs. How is this diagnosed? This condition is diagnosed based on:  Your symptoms.  Your medical history.  A physical exam. ? Your health care provider may push on certain areas of your back to determine the source of your pain. ? You may be asked to bend forward, backward, and side to side to assess the severity of your pain and your range of motion.  Imaging tests, such as: ? X-rays. ? MRI.  How is this treated? Treatment for this condition may include:  Putting heat and cold on the affected area.  Medicines to help relieve pain and relax your muscles (muscle relaxants).  NSAIDs to help reduce swelling and discomfort.  When your symptoms improve, it is important to gradually return  to your normal routine as soon as possible to reduce pain, avoid stiffness, and avoid loss of muscle strength. Generally, symptoms should improve within 6 weeks of treatment. However, recovery time varies. Follow these instructions at home: Managing pain, stiffness, and swelling   If directed, put ice on the injured area during the first 24 hours after your strain. ? Put ice in a plastic bag. ? Place a towel between your skin and the bag. ? Leave the ice on for 20 minutes, 2-3 times a day.  If directed, put heat on the affected  area as often as told by your health care provider. Use the heat source that your health care provider recommends, such as a moist heat pack or a heating pad. ? Place a towel between your skin and the heat source. ? Leave the heat on for 20-30 minutes. ? Remove the heat if your skin turns bright red. This is especially important if you are unable to feel pain, heat, or cold. You may have a greater risk of getting burned. Activity  Rest and return to your normal activities as told by your health care provider. Ask your health care provider what activities are safe for you.  Avoid activities that take a lot of energy for as long as told by your health care provider. General instructions  Take over-the-counter and prescription medicines only as told by your health care provider.  Donot drive or use heavy machinery while taking prescription pain medicine.  Do not use any products that contain nicotine or tobacco, such as cigarettes and e-cigarettes. If you need help quitting, ask your health care provider.  Keep all follow-up visits as told by your health care provider. This is important. How is this prevented?  Use correct form when playing sports and lifting heavy objects.  Use good posture when sitting and standing.  Maintain a healthy weight.  Sleep on a mattress with medium firmness to support your back.  Be safe and responsible while being active to avoid falls.  Do at least 150 minutes of moderate-intensity exercise each week, such as brisk walking or water aerobics. Try a form of exercise that takes stress off your back, such as swimming or stationary cycling.  Maintain physical fitness, including: ? Strength. ? Flexibility. ? Cardiovascular fitness. ? Endurance. Contact a health care provider if:  Your back pain does not improve after 6 weeks of treatment.  Your symptoms get worse. Get help right away if:  Your back pain is severe.  You cannot stand or walk.  You  have difficulty controlling when you urinate or when you have a bowel movement.  You feel nauseous or you vomit.  Your feet get very cold.  You have numbness, tingling, weakness, or problems using your arms or legs.  You develop any of the following: ? Shortness of breath. ? Dizziness. ? Pain in your legs. ? Weakness in your buttocks or legs. ? Discoloration of the skin on your toes or legs. This information is not intended to replace advice given to you by your health care provider. Make sure you discuss any questions you have with your health care provider. Document Released: 04/29/2005 Document Revised: 02/07/2016 Document Reviewed: 12/22/2015 Elsevier Interactive Patient Education  [employer].

## 2017-09-29 ENCOUNTER — Ambulatory Visit: Payer: Medicare Other | Admitting: Family Medicine

## 2017-09-30 ENCOUNTER — Encounter: Payer: Self-pay | Admitting: Rehabilitative and Restorative Service Providers"

## 2017-09-30 ENCOUNTER — Ambulatory Visit (INDEPENDENT_AMBULATORY_CARE_PROVIDER_SITE_OTHER): Payer: Medicare Other | Admitting: Rehabilitative and Restorative Service Providers"

## 2017-09-30 DIAGNOSIS — M545 Low back pain, unspecified: Secondary | ICD-10-CM

## 2017-09-30 DIAGNOSIS — R29898 Other symptoms and signs involving the musculoskeletal system: Secondary | ICD-10-CM

## 2017-09-30 DIAGNOSIS — R293 Abnormal posture: Secondary | ICD-10-CM

## 2017-09-30 NOTE — Therapy (Signed)
[facility] [place] [address] [place], [place], [address] Phone: [phone]   Fax:  [phone]  Physical Therapy Evaluation  Patient Details  Name: Luis Fernandez MRN: [id number] Date of Birth: 1945/10/22 Referring Provider: Dr [name]    Encounter Date: 09/30/2017  PT End of Session - 09/30/17 1255    Visit Number  1    Number of Visits  12    Date for PT Re-Evaluation  11/11/17    PT Start Time  0930    PT Stop Time  1034    PT Time Calculation (min)  64 min    Activity Tolerance  Patient tolerated treatment well       Past Medical History:  Diagnosis Date  . Diabetes mellitus without complication (HCC)   . Hypertension   . 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 CARPAL TUNNEL RELEASE    . CATARACT EXTRACTION    . NOSE SURGERY    . TOTAL KNEE ARTHROPLASTY    . UMBILICAL HERNIA REPAIR      There were no vitals filed for this visit.   Subjective Assessment - 09/30/17 0941    Subjective  Patient reports that he was installing shelving in a pantry whe he moved the wrong way - was on a ladder when he twisted. He felt discomfort and soreness following the incident. He has had continued pain including spasm in the LB and buttocks Rt > Lt      Pertinent History  3 knee surgeries last was TKA 2009; carpal tunnel sx 1980's; nasal surgery; hernia repair 1990's; back strain 5-7 yrs ago    How long can you sit comfortably?  1-2 hours     How long can you stand comfortably?  30 min     How long can you walk comfortably?  30-45 min     Diagnostic tests  xrays     Patient Stated Goals  get rid of the pain     Currently in Pain?  Yes    Pain Score  6     Pain Location  Back    Pain Orientation  Right;Lower    Pain Descriptors / Indicators  Nagging;Jabbing;Tightness;Spasm    Pain Radiating Towards  into buttocks Rt > Lt     Pain Onset  More than a month ago    Pain Frequency  Intermittent    Aggravating Factors   lying down to sleep; worse in am(stiff)     Pain Relieving Factors  heat; TENS unit          [facility] PT Assessment - 09/30/17 0001      Assessment   Medical Diagnosis  LBP with pain into bilat buttocks Rt > Lt     Referring Provider  Dr [name]     Onset Date/Surgical Date  08/17/17    Hand Dominance  Right    Next MD Visit  6/19    Prior Therapy  none for back - PT post op TKA       Precautions   Precautions  None      Balance Screen   Has the patient fallen in the past 6 months  No    Has the patient had a decrease in activity level because of a fear of falling?   No    Is the patient reluctant to leave their home because of  a fear of falling?   No      Home Environment   Additional Comments  single level home - walk in crawl space       Prior Function   Level of Independence  Independent    Vocation  Retired 2007    Research officer, political party judge; trial and appeal work [place]     Leisure  walks dog daily 2-2.63miles; fishing; gardening; household chores; Animator; reading       Observation/Other Assessments   Focus on Therapeutic Outcomes (FOTO)   53% limitation       Sensation   Additional Comments  WFL's per       Posture/Postural Control   Posture Comments  flexed forward at hips; head and shoulders forward       AROM   Overall AROM Comments  very limited trunk mobility     Right/Left Hip  -- end range tightness especially hip extension     Right/Left Knee  -- tight Lt knee flexion post TKA ~ 12 years ago     Lumbar Flexion  70% flexing forward at hips and knees     Lumbar Extension  5% discomfort - very limited movement     Lumbar - Right Side Bend  50% pull and discomfort Rt lumbar     Lumbar - Left Side Bend  45% pull and discomfort Rt lumbar     Lumbar - Right Rotation  35%    Lumbar - Left Rotation  30%      Strength   Overall Strength Comments   grossly 5/5 bilat LE's       Flexibility   Hamstrings  Rt 60 deg; Lt 55 deg     Quadriceps  tight Rt 100 deg; Lt 90 deg     ITB  tight Rt > Lt     Piriformis  tight Rt > Lt       Palpation   Spinal mobility  hypomobility through sacrum/lumbar spine with CPA and lateral mobs     SI assessment   some pelvic asymmetry     Palpation comment  muscular tightness noted through the Rt > Lt lumbar paraspinals, QL; lats; piriformis; glut med/min       Special Tests    Special Tests  -- (-) SLR; [place]    Other special tests  positive for rectus diastasis 3-4 finger separation       Ambulation/Gait   Gait Comments  ambulats with forward flexed posture; LE's in ER              Objective measurements completed on examination: See above findings.      [facility] Adult PT Treatment/Exercise - 09/30/17 0001      Self-Care   Self-Care  -- initiated back care education       Therapeutic Activites    Therapeutic Activities  -- instructed in modifications for sitting to encourage neutral      Lumbar Exercises: Stretches   Passive Hamstring Stretch  Right;Left;2 reps;30 seconds supine with strap     Quad Stretch  Right;Left;2 reps;30 seconds prone with strap     ITB Stretch  Right;Left;2 reps;30 seconds supine with strap     Piriformis Stretch  Right;Left;2 reps;30 seconds supine with strap - travell       Moist Heat Therapy   Number Minutes Moist Heat  20 Minutes    Moist Heat Location  Lumbar Spine;Hip      Electrical Stimulation  Electrical Stimulation Location  Rt lumbar/posterior hip; Lt lumbar     Electrical Stimulation Action  IFC    Electrical Stimulation Parameters  to tolerance    Electrical Stimulation Goals  Pain;Tone             PT Education - 09/30/17 1011    Education provided  Yes    Education Details  HEP DN back care     Person(s) Educated  Patient    Methods  Explanation;Demonstration;Tactile cues;Verbal cues;Handout    Comprehension  Verbalized  understanding;Returned demonstration;Verbal cues required;Tactile cues required          PT Long Term Goals - 09/30/17 1300      PT LONG TERM GOAL #1   Title  Decrease LBP and hip pain by 50-75% allowing patient to return to normal functional activities 11/11/17    Time  6    Period  Weeks    Status  New      PT LONG TERM GOAL #2   Title  Increase spinal mobility allowing patient to perform trunk ROM with decreased discomfort and improved ROM/mobility 11/11/17    Time  6    Period  Weeks    Status  New      PT LONG TERM GOAL #3   Title  Patient to report and demonstrate improved body mechanics for functional activity and postures 11/11/17    Time  6    Period  Weeks    Status  New      PT LONG TERM GOAL #4   Title  Independent in HEP with instruction in return to community based exercise program 11/11/17    Time  6    Period  Weeks    Status  New      PT LONG TERM GOAL #5   Title  Improve FOTO to </=39% limitation 11/11/17    Time  6    Period  Weeks    Status  New             Plan - 09/30/17 1256    Clinical Impression Statement  Luis Fernandez presents with ~6 wk history of Rt > Lt LBP with pain radiating into bilat buttocks on an intermittent basis. Luis Fernandez has poor posture and alignment; limited trunk and LE mobilty/ROM; core weakness including rectus diastasis; palpable tightness through lumbar and posterior hip musculature; pain limiting functional activities and ADL's. Patient will benefit from PT to address problems identified.     Clinical Presentation  Stable    Clinical Decision Making  Low    Rehab Potential  Good    PT Frequency  2x / week    PT Duration  6 weeks    PT Treatment/Interventions  Patient/family education;ADLs/Self Care Home Management;Cryotherapy;Electrical Stimulation;Iontophoresis 4mg /ml Dexamethasone;Moist Heat;Ultrasound;Dry needling;Manual techniques;Neuromuscular re-education;Therapeutic activities;Therapeutic exercise    PT Next Visit Plan   trial of DN Rt > Lt lumbar/buttocks; manual work; stretching; core stabilization; strengthening; modalities as indicated     Consulted and Agree with Plan of Care  Patient       Patient will benefit from skilled therapeutic intervention in order to improve the following deficits and impairments:  Postural dysfunction, Improper body mechanics, Pain, Increased fascial restricitons, Increased muscle spasms, Hypomobility, Decreased mobility, Decreased range of motion, Decreased activity tolerance  Visit Diagnosis: Acute bilateral low back pain without sciatica - Plan: PT plan of care cert/re-cert  Other symptoms and signs involving the musculoskeletal system - Plan: PT plan of care cert/re-cert  Abnormal  posture - Plan: PT plan of care cert/re-cert     Problem List Patient Active Problem List   Diagnosis Date Noted  . Lumbar strain 09/02/2017  . Trigger finger of left hand 05/26/2017  . Paresthesia 06/23/2016  . OSA (obstructive sleep apnea) 06/23/2016  . B12 deficiency 01/21/2016  . 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 Hyperlipidemia LDL goal <70 12/17/2015  . Essential hypertension 12/17/2015  . Obese 12/17/2015  . 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 Insomnia 12/17/2015    Shun Pletz Rober MinionP Burnie Hank PT, MPH  09/30/2017, 1:08 PM  [facility] [address] [place], [place], [address] Phone: [phone]   Fax:  [phone]  Name: Luis Fernandez MRN: [id number] Date of Birth: June 05, 1946

## 2017-09-30 NOTE — Patient Instructions (Signed)
HIP: Hamstrings - Supine  Place strap around foot. Raise leg up, keeping knee straight.  Bend opposite knee to protect back if indicated. Hold 30 seconds. 3 reps per set, 2-3 sets per day  Outer Hip Stretch: Reclined IT Band Stretch (Strap)   Strap around one foot, pull leg across body until you feel a pull or stretch in the outside of your hip, with shoulders on mat. Hold for 30 seconds. Repeat 3 times each leg. 2-3 times/day.  Piriformis Stretch   Lying on back, pull right knee toward opposite shoulder. Hold 30 seconds. Repeat 3 times. Do 2-3 sessions per day.    Quads / HF, Prone KNEE: Quadriceps - Prone    Place strap around ankle. Bring ankle toward buttocks. Press hip into surface. Hold 30 seconds. Repeat 3 times per session. Do 2-3 sessions per day.  Trigger Point Dry Needling  . What is Trigger Point Dry Needling (DN)? o DN is a physical therapy technique used to treat muscle pain and dysfunction. Specifically, DN helps deactivate muscle trigger points (muscle knots).  o A thin filiform needle is used to penetrate the skin and stimulate the underlying trigger point. The goal is for a local twitch response (LTR) to occur and for the trigger point to relax. No medication of any kind is injected during the procedure.   . What Does Trigger Point Dry Needling Feel Like?  o The procedure feels different for each individual patient. Some patients report that they do not actually feel the needle enter the skin and overall the process is not painful. Very mild bleeding may occur. However, many patients feel a deep cramping in the muscle in which the needle was inserted. This is the local twitch response.   . How Will I feel after the treatment? o Soreness is normal, and the onset of soreness may not occur for a few hours. Typically this soreness does not last longer than two days.  o Bruising is uncommon, however; ice can be used to decrease any possible bruising.  o In rare  cases feeling tired or nauseous after the treatment is normal. In addition, your symptoms may get worse before they get better, this period will typically not last longer than 24 hours.   . What Can I do After My Treatment? o Increase your hydration by drinking more water for the next 24 hours. o You may place ice or heat on the areas treated that have become sore, however, do not use heat on inflamed or bruised areas. Heat often brings more relief post needling. o You can continue your regular activities, but vigorous activity is not recommended initially after the treatment for 24 hours. o DN is best combined with other physical therapy such as strengthening, stretching, and other therapies.    Sleeping on Back  Place pillow under knees. A pillow with cervical support and a roll around waist are also helpful. Copyright  VHI. All rights reserved.  Sleeping on Side Place pillow between knees. Use cervical support under neck and a roll around waist as needed. Copyright  VHI. All rights reserved.   Sleeping on Stomach   If this is the only desirable sleeping position, place pillow under lower legs, and under stomach or chest as needed.  Posture - Sitting   Sit upright, head facing forward. Try using a roll to support lower back. Keep shoulders relaxed, and avoid rounded back. Keep hips level with knees. Avoid crossing legs for long periods. Stand to Sit /   Sit to Stand   To sit: Bend knees to lower self onto front edge of chair, then scoot back on seat. To stand: Reverse sequence by placing one foot forward, and scoot to front of seat. Use rocking motion to stand up.   Work Height and Reach  Ideal work height is no more than 2 to 4 inches below elbow level when standing, and at elbow level when sitting. Reaching should be limited to arm's length, with elbows slightly bent.  Bending  Bend at hips and knees, not back. Keep feet shoulder-width apart.    Posture - Standing   Good  posture is important. Avoid slouching and forward head thrust. Maintain curve in low back and align ears over shoul- ders, hips over ankles.  Alternating Positions   Alternate tasks and change positions frequently to reduce fatigue and muscle tension. Take rest breaks. Computer Work   Position work to face forward. Use proper work and seat height. Keep shoulders back and down, wrists straight, and elbows at right angles. Use chair that provides full back support. Add footrest and lumbar roll as needed.  Getting Into / Out of Car  Lower self onto seat, scoot back, then bring in one leg at a time. Reverse sequence to get out.  Dressing  Lie on back to pull socks or slacks over feet, or sit and bend leg while keeping back straight.    Housework - Sink  Place one foot on ledge of cabinet under sink when standing at sink for prolonged periods.   Pushing / Pulling  Pushing is preferable to pulling. Keep back in proper alignment, and use leg muscles to do the work.  Deep Squat   Squat and lift with both arms held against upper trunk. Tighten stomach muscles without holding breath. Use smooth movements to avoid jerking.  Avoid Twisting   Avoid twisting or bending back. Pivot around using foot movements, and bend at knees if needed when reaching for articles.  Carrying Luggage   Distribute weight evenly on both sides. Use a cart whenever possible. Do not twist trunk. Move body as a unit.   Lifting Principles .Maintain proper posture and head alignment. .Slide object as close as possible before lifting. .Move obstacles out of the way. .Test before lifting; ask for help if too heavy. .Tighten stomach muscles without holding breath. .Use smooth movements; do not jerk. .Use legs to do the work, and pivot with feet. .Distribute the work load symmetrically and close to the center of trunk. .Push instead of pull whenever possible.   Ask For Help   Ask for help and delegate  to others when possible. Coordinate your movements when lifting together, and maintain the low back curve.  Log Roll   Lying on back, bend left knee and place left arm across chest. Roll all in one movement to the right. Reverse to roll to the left. Always move as one unit. Housework - Sweeping  Use long-handled equipment to avoid stooping.   Housework - Wiping  Position yourself as close as possible to reach work surface. Avoid straining your back.  Laundry - Unloading Wash   To unload small items at bottom of washer, lift leg opposite to arm being used to reach.  Gardening - Raking  Move close to area to be raked. Use arm movements to do the work. Keep back straight and avoid twisting.     Cart  When reaching into cart with one arm, lift opposite leg to keep back   straight.   Getting Into / Out of Bed  Lower self to lie down on one side by raising legs and lowering head at the same time. Use arms to assist moving without twisting. Bend both knees to roll onto back if desired. To sit up, start from lying on side, and use same move-ments in reverse. Housework - Vacuuming  Hold the vacuum with arm held at side. Step back and forth to move it, keeping head up. Avoid twisting.   Laundry - Loading Wash  Position laundry basket so that bending and twisting can be avoided.   Laundry - Unloading Dryer  Squat down to reach into clothes dryer or use a reacher.  Gardening - Weeding / Planting  Squat or Kneel. Knee pads may be helpful.                    

## 2017-10-04 ENCOUNTER — Ambulatory Visit (INDEPENDENT_AMBULATORY_CARE_PROVIDER_SITE_OTHER): Payer: Medicare Other | Admitting: Physical Therapy

## 2017-10-04 DIAGNOSIS — R293 Abnormal posture: Secondary | ICD-10-CM

## 2017-10-04 DIAGNOSIS — M545 Low back pain, unspecified: Secondary | ICD-10-CM

## 2017-10-04 DIAGNOSIS — R29898 Other symptoms and signs involving the musculoskeletal system: Secondary | ICD-10-CM

## 2017-10-04 NOTE — Therapy (Signed)
[facility] [address] [place], [place], [address] Phone: [phone]   Fax:  [phone]  Physical Therapy Treatment  Patient Details  Name: Luis BalesLawrence Fernandez MRN: [id number] Date of Birth: 12-25-1945 Referring Provider: Dr. Clementeen GrahamEvan Corey    Encounter Date: 10/04/2017  PT End of Session - 10/04/17 1025    Visit Number  2    Number of Visits  12    Date for PT Re-Evaluation  11/11/17    PT Start Time  1018    PT Stop Time  1125    PT Time Calculation (min)  67 min    Activity Tolerance  Patient tolerated treatment well;No increased pain    Behavior During Therapy  WFL for tasks assessed/performed       Past Medical History:  Diagnosis Date  . Diabetes mellitus without complication (HCC)   . Hypertension   . 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 CARPAL TUNNEL RELEASE    . CATARACT EXTRACTION    . NOSE SURGERY    . TOTAL KNEE ARTHROPLASTY    . UMBILICAL HERNIA REPAIR      There were no vitals filed for this visit.  Subjective Assessment - 10/04/17 1022    Subjective  Pt reports he doesn't have as much pain in Rt hip/low back.  He is not getting as many "jolts".  He has been using his TENS unit more and trying to use heating pad less (as to not burn himself).  He reports compliance of HEP 3x daily.     Patient Stated Goals  get rid of the pain; return to gardening and walking 1-2 miles a day.     Currently in Pain?  Yes    Pain Score  2     Pain Location  Back    Pain Orientation  Right;Lower    Pain Descriptors / Indicators  Sore    Aggravating Factors   not predictable; moving a certain way.     Pain Relieving Factors  heat; TENS unit.          [facility] - 10/04/17 0001      Assessment   Medical Diagnosis  LBP with pain into bilat buttocks Rt > Lt     Referring Provider  Dr. Clementeen GrahamEvan Corey     Onset  Date/Surgical Date  08/17/17    Hand Dominance  Right    Next MD Visit  6/19        [facility] Adult [facility] Treatment/Exercise - 10/04/17 0001      Self-Care   Self-Care  Other Self-Care Comments    Other Self-Care Comments   pt educated in self massage with ball to hip and LB musculature; pt verbalized understanding and returned demo.       Lumbar Exercises: Stretches   Passive Hamstring Stretch  Right;Left;2 reps;30 seconds supine with strap     ITB Stretch  Right;Left;2 reps;30 seconds supine with strap     Piriformis Stretch  Right;Left;2 reps;30 seconds supine with strap - travell       Lumbar Exercises: Aerobic   Nustep  L5 (legs only):  5 min       Lumbar Exercises: Seated   Other Seated Lumbar Exercises  educated pt on sit to/from supine with demo; pt returned demo with VC      Lumbar Exercises: Supine   Ab Set  5 reps 10 sec hold; pt educated on 3 part core.     Clam  10 reps with ab set    Bent Knee Raise  10 reps;Limitations with ab set    Bent Knee Raise Limitations  multiple cues for form and breath.     Bridge  10 reps core engaged.       Moist Heat Therapy   Number Minutes Moist Heat  15 Minutes    Moist Heat Location  Lumbar Spine;Hip      Electrical Stimulation   Electrical Stimulation Location  Rt lumbar/posterior hip; Lt lumbar     Electrical Stimulation Action  TENS    Electrical Stimulation Parameters  to tolerance    Electrical Stimulation Goals  Pain;Tone      Manual Therapy   Manual Therapy  Soft tissue mobilization    Soft tissue mobilization  STM to Rt QL, TPR to Rt piriformis in Lt sidelying.                   PT Long Term Goals - 10/04/17 1300      PT LONG TERM GOAL #1   Title  Decrease LBP and hip pain by 50-75% allowing patient to return to normal functional activities 11/11/17    Time  6    Period  Weeks    Status  On-going      PT LONG TERM GOAL #2   Title  Increase spinal mobility allowing patient to perform trunk ROM with  decreased discomfort and improved ROM/mobility 11/11/17    Time  6    Period  Weeks    Status  On-going      PT LONG TERM GOAL #3   Title  Patient to report and demonstrate improved body mechanics for functional activity and postures 11/11/17    Time  6    Period  Weeks    Status  On-going      PT LONG TERM GOAL #4   Title  Independent in HEP with instruction in return to community based exercise program 11/11/17    Time  6    Period  Weeks    Status  On-going      PT LONG TERM GOAL #5   Title  Improve FOTO to </=39% limitation 11/11/17    Time  6    Period  Weeks    Status  On-going            Plan - 10/04/17 1117    Clinical Impression Statement  Pt reported twinges of Rt low back pain with transitional movements.  Less twinges with same motions once instructed in TA engagement.  Pt progressing towards goals.     Rehab Potential  Good    PT Frequency  2x / week    PT Duration  6 weeks    PT Treatment/Interventions  Patient/family education;ADLs/Self Care Home Management;Cryotherapy;Electrical Stimulation;Iontophoresis 4mg /ml Dexamethasone;Moist Heat;Ultrasound;Dry needling;Manual techniques;Neuromuscular re-education;Therapeutic activities;Therapeutic exercise    PT Next Visit Plan  trial of DN Rt > Lt lumbar/buttocks; manual work; stretching; core stabilization; strengthening; modalities as indicated     Consulted and Agree with Plan of Care  Patient       Patient will benefit from skilled therapeutic intervention in order to improve the following deficits and impairments:  Postural dysfunction, Improper body mechanics, Pain, Increased fascial restricitons, Increased muscle spasms, Hypomobility, Decreased mobility, Decreased range of motion, Decreased activity tolerance  Visit Diagnosis: Acute bilateral low back pain without sciatica  Other symptoms and signs involving the musculoskeletal system  Abnormal posture     Problem List Patient Active Problem List    Diagnosis Date Noted  . Lumbar strain 09/02/2017  . Trigger finger of left hand 05/26/2017  . Paresthesia 06/23/2016  . OSA (obstructive sleep apnea) 06/23/2016  . B12 deficiency 01/21/2016  . 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 Hyperlipidemia LDL goal <70 12/17/2015  . Essential hypertension 12/17/2015  . Obese 12/17/2015  . 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 Insomnia 12/17/2015   Mayer Camel, PTA 10/04/17 1:01 PM  [facility] [place] [address] [place], [place], [address] Phone: [phone]   Fax:  [phone]  Name: Saket Hellstrom MRN: [id number] Date of Birth: Mar 15, 1946

## 2017-10-04 NOTE — Patient Instructions (Signed)
  Abdominal Bracing With Pelvic Floor (Hook-Lying)   With neutral spine, tighten pelvic floor and abdominals. Hold 10 seconds. Repeat __10_ times. Do _several__ times a day. Can be performed in sitting, standing. .  Knee to Chest: Transverse Plane Stability   Bring one knee up, then return. Be sure pelvis does not roll side to side. Keep pelvis still. Lift knee __10_ times each leg. Restabilize pelvis. Repeat with other leg. Do _1-2__ sets, _1-2__ times per day.  Hip External Rotation With Pillow: Transverse Plane Stability   KEEP BOTH KNEES BENT.  Slowly roll bent knee out. Be sure pelvis does not rotate. Do _10__ times. Restabilize pelvis. Repeat with other leg. Do _1-2__ sets, _1__ times per day.    [facility] [address] [place], [place] [address]  [phone] (office) [phone] (fax)   

## 2017-10-05 ENCOUNTER — Encounter: Payer: Self-pay | Admitting: Family Medicine

## 2017-10-05 MED ORDER — TEMAZEPAM 30 MG PO CAPS: 30.0000 mg | ORAL_CAPSULE | Freq: Every evening | ORAL | 3 refills | Status: DC | PRN

## 2017-10-05 NOTE — Telephone Encounter (Signed)
Pt called. He  is out of this med.

## 2017-10-06 ENCOUNTER — Ambulatory Visit (INDEPENDENT_AMBULATORY_CARE_PROVIDER_SITE_OTHER): Payer: Medicare Other | Admitting: Rehabilitative and Restorative Service Providers"

## 2017-10-06 ENCOUNTER — Encounter: Payer: Self-pay | Admitting: Rehabilitative and Restorative Service Providers"

## 2017-10-06 DIAGNOSIS — M545 Low back pain, unspecified: Secondary | ICD-10-CM

## 2017-10-06 DIAGNOSIS — R293 Abnormal posture: Secondary | ICD-10-CM | POA: Diagnosis not present

## 2017-10-06 DIAGNOSIS — R29898 Other symptoms and signs involving the musculoskeletal system: Secondary | ICD-10-CM | POA: Diagnosis not present

## 2017-10-06 NOTE — Therapy (Signed)
[facility] [address] [place], [place], [address] Phone: [phone]   Fax:  [phone]  Physical Therapy Treatment  Patient Details  Name: Luis BalesLawrence Fernandez MRN: [id number] Date of Birth: 03-19-1946 Referring Provider: Dr. Clementeen GrahamEvan Corey    Encounter Date: 10/06/2017  PT End of Session - 10/06/17 1021    Visit Number  3    Number of Visits  12    Date for PT Re-Evaluation  11/11/17    PT Start Time  1021    PT Stop Time  1120    PT Time Calculation (min)  59 min    Activity Tolerance  Patient tolerated treatment well       Past Medical History:  Diagnosis Date  . Diabetes mellitus without complication (HCC)   . Hypertension   . 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 CARPAL TUNNEL RELEASE    . CATARACT EXTRACTION    . NOSE SURGERY    . TOTAL KNEE ARTHROPLASTY    . UMBILICAL HERNIA REPAIR      There were no vitals filed for this visit.  Subjective Assessment - 10/06/17 1021    Subjective  Continued pain in the Rt hip - some pain or soreness with some of the stretches (piriformis).Ready to try the DN. Using the TENS unit and some heat at home. Does feel he is making some porgress.     Currently in Pain?  No/denies    Pain Location  Hip    Pain Orientation  Right    Pain Descriptors / Indicators  Sore                      [facility] Adult PT Treatment/Exercise - 10/06/17 0001      Lumbar Exercises: Stretches   Passive Hamstring Stretch  Right;Left;2 reps;30 seconds supine with strap     ITB Stretch  Right;Left;2 reps;30 seconds supine with strap     Piriformis Stretch  Right;Left;2 reps;30 seconds supine with strap - travell       Moist Heat Therapy   Number Minutes Moist Heat  20 Minutes    Moist Heat Location  Lumbar Spine;Hip      Electrical Stimulation   Electrical Stimulation Location  Rt  lumbar/posterior hip; Lt lumbar     Electrical Stimulation Action  IFC    Electrical Stimulation Parameters  to tolerance    Electrical Stimulation Goals  Pain;Tone      Manual Therapy   Manual therapy comments  pt prone     Soft tissue mobilization  Rt > Lt lumbar paraspinals; QL; piriformis; gluts        Trigger Point Dry Needling - 10/06/17 1045    Consent Given?  Yes    Education Handout Provided  Yes    Muscles Treated Lower Body  -- Rt with estim - glut med     Gluteus Maximus Response  Palpable increased muscle length    Gluteus Minimus Response  Palpable increased muscle length    Piriformis Response  Palpable increased muscle length                PT Long Term Goals - 10/04/17 1300      PT LONG TERM GOAL #1   Title  Decrease LBP and hip pain by 50-75% allowing patient to return to normal functional activities 11/11/17    Time  6    Period  Weeks    Status  On-going      PT LONG TERM GOAL #2   Title  Increase spinal mobility allowing patient to perform trunk ROM with decreased discomfort and improved ROM/mobility 11/11/17    Time  6    Period  Weeks    Status  On-going      PT LONG TERM GOAL #3   Title  Patient to report and demonstrate improved body mechanics for functional activity and postures 11/11/17    Time  6    Period  Weeks    Status  On-going      PT LONG TERM GOAL #4   Title  Independent in HEP with instruction in return to community based exercise program 11/11/17    Time  6    Period  Weeks    Status  On-going      PT LONG TERM GOAL #5   Title  Improve FOTO to </=39% limitation 11/11/17    Time  6    Period  Weeks    Status  On-going            Plan - 10/06/17 1048    Clinical Impression Statement  Tolerated DN followed by manual work well with good release of muscular tightness to palpation. Luis Fernandez will decrease intensity of piriformis stretch to see if he has less soreness.     Rehab Potential  Good    PT Frequency  2x / week     PT Duration  6 weeks    PT Treatment/Interventions  Patient/family education;ADLs/Self Care Home Management;Cryotherapy;Electrical Stimulation;Iontophoresis 4mg /ml Dexamethasone;Moist Heat;Ultrasound;Dry needling;Manual techniques;Neuromuscular re-education;Therapeutic activities;Therapeutic exercise    PT Next Visit Plan  assess response to DN Rt > Lt lumbar/buttocks; manual work; stretching; core stabilization; strengthening; modalities as indicated     Consulted and Agree with Plan of Care  Patient       Patient will benefit from skilled therapeutic intervention in order to improve the following deficits and impairments:  Postural dysfunction, Improper body mechanics, Pain, Increased fascial restricitons, Increased muscle spasms, Hypomobility, Decreased mobility, Decreased range of motion, Decreased activity tolerance  Visit Diagnosis: Acute bilateral low back pain without sciatica  Other symptoms and signs involving the musculoskeletal system  Abnormal posture     Problem List Patient Active Problem List   Diagnosis Date Noted  . Lumbar strain 09/02/2017  . Trigger finger of left hand 05/26/2017  . Paresthesia 06/23/2016  . OSA (obstructive sleep apnea) 06/23/2016  . B12 deficiency 01/21/2016  . 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 Hyperlipidemia LDL goal <70 12/17/2015  . Essential hypertension 12/17/2015  . Obese 12/17/2015  . 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 Insomnia 12/17/2015    Latiana Tomei Rober Minion PT, MPH  10/06/2017, 10:50 AM  [facility] [address] [place], [place], [address] Phone: [phone]   Fax:  [phone]  Name: Luis Fernandez MRN: [id number] Date of Birth: 1945-09-06

## 2017-10-06 NOTE — Addendum Note (Signed)
Addended by: Chalmers CaterUTTLE, Damascus Feldpausch H on: 10/06/2017 07:22 AM   Modules accepted: Orders

## 2017-10-11 ENCOUNTER — Ambulatory Visit (INDEPENDENT_AMBULATORY_CARE_PROVIDER_SITE_OTHER): Payer: Medicare Other | Admitting: Physical Therapy

## 2017-10-11 ENCOUNTER — Other Ambulatory Visit: Payer: Self-pay | Admitting: Family Medicine

## 2017-10-11 DIAGNOSIS — M545 Low back pain, unspecified: Secondary | ICD-10-CM

## 2017-10-11 DIAGNOSIS — R29898 Other symptoms and signs involving the musculoskeletal system: Secondary | ICD-10-CM | POA: Diagnosis not present

## 2017-10-11 DIAGNOSIS — R293 Abnormal posture: Secondary | ICD-10-CM

## 2017-10-11 NOTE — Therapy (Signed)
[place], [place], [address] Phone: [phone]   Fax:  [phone]  Physical Therapy Treatment  Patient Details  Name: Luis Fernandez MRN: [id number] Date of Birth: 1945-09-04 Referring Provider: Dr. Lynne Leader    Encounter Date: 10/11/2017  PT End of Session - 10/11/17 1114    Visit Number  4    Number of Visits  12    Date for PT Re-Evaluation  11/11/17    PT Start Time  1105    PT Stop Time  1200    PT Time Calculation (min)  55 min    Activity Tolerance  Patient tolerated treatment well    Behavior During Therapy  [facility] for tasks assessed/performed       Past Medical History:  Diagnosis Date  . Diabetes mellitus without complication ([place])   . Hypertension   . 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 CARPAL TUNNEL RELEASE    . CATARACT EXTRACTION    . NOSE SURGERY    . TOTAL KNEE ARTHROPLASTY    . UMBILICAL HERNIA REPAIR      There were no vitals filed for this visit.  Subjective Assessment - 10/11/17 1111    Subjective  Luis Fernandez reports the DN during last appt has helped tremendously.  He noticed that it has cut his pain in 1/2.  He is able to walk dog longer ( up to 1.5 miles). He is still taking muscle relaxer at night.  He is no longer waking in the night from pain.      Patient Stated Goals  get rid of the pain; return to gardening and walking 1-2 miles a day.     Currently in Pain?  Yes    Pain Score  2     Pain Location  Back    Pain Orientation  Left;Right into Rt hip    Pain Descriptors / Indicators  Aching;Sore    Aggravating Factors   being tired.     Pain Relieving Factors  Heat, TENS unit, meds         [facility] Adult PT Treatment/Exercise - 10/11/17 0001      Lumbar Exercises: Stretches   Passive Hamstring Stretch  Right;Left;2 reps;30 seconds supine with strap     Lower  Trunk Rotation  5 reps;10 seconds arms in T, arms in Y    ITB Stretch  Right;Left;2 reps;30 seconds supine with strap     Piriformis Stretch  Right;Left;2 reps;30 seconds supine with strap - travell       Lumbar Exercises: Aerobic   Nustep  L5:  arms/legs x 6 min  PTA present to discuss progress      Lumbar Exercises: Supine   Ab Set  5 reps;5 seconds tactile cues to draw navel in; not bear down    Clam  10 reps with ab set    Bent Knee Raise  Limitations;20 reps with ab set    Bridge  10 reps core engaged.       Lumbar Exercises: Sidelying   Clam  Right;10 reps      Moist Heat Therapy   Number Minutes Moist Heat  15 Minutes    Moist Heat Location  Lumbar Spine;Hip      Electrical Stimulation   Electrical Stimulation Location  Rt lumbar/posterior hip; Lt lumbar     Electrical Stimulation Action  IFC  Electrical Stimulation Parameters  to tolerance     Electrical Stimulation Goals  Pain;Tone      Manual Therapy   Manual therapy comments  pt in Lt sidelying     Soft tissue mobilization  TPR and STM to Rt glute med /min.         PT Long Term Goals - 10/11/17 1222      PT LONG TERM GOAL #1   Title  Decrease LBP and hip pain by 50-75% allowing patient to return to normal functional activities 11/11/17    Time  6    Period  Weeks    Status  Achieved      PT LONG TERM GOAL #2   Title  Increase spinal mobility allowing patient to perform trunk ROM with decreased discomfort and improved ROM/mobility 11/11/17    Time  6    Period  Weeks    Status  On-going      PT LONG TERM GOAL #3   Title  Patient to report and demonstrate improved body mechanics for functional activity and postures 11/11/17    Time  6    Period  Weeks    Status  On-going      PT LONG TERM GOAL #4   Title  Independent in HEP with instruction in return to community based exercise program 11/11/17    Time  6    Period  Weeks    Status  On-going      PT LONG TERM GOAL #5   Title  Improve FOTO to </=39%  limitation 11/11/17    Time  6    Period  Weeks    Status  On-going            Plan - 10/11/17 1120    Clinical Impression Statement  Pt had positive response to DN last session; pt reporting 75% reduction in pain. Has met LTG#1. Reviewed exercises to assess form and need for modification or advancement.  He required some tactile/VC for improved TA contraction. Pt reported elimination of Lt mid back pain with performance of gentle LTR with arms in T.  Pt reported reduciton in pain by end of session.     Rehab Potential  Good    PT Frequency  2x / week    PT Duration  6 weeks    PT Treatment/Interventions  Patient/family education;ADLs/Self Care Home Management;Cryotherapy;Electrical Stimulation;Iontophoresis 36m/ml Dexamethasone;Moist Heat;Ultrasound;Dry needling;Manual techniques;Neuromuscular re-education;Therapeutic activities;Therapeutic exercise    PT Next Visit Plan  DN and continue spinal stabilization.     Consulted and Agree with Plan of Care  Patient       Patient will benefit from skilled therapeutic intervention in order to improve the following deficits and impairments:  Postural dysfunction, Improper body mechanics, Pain, Increased fascial restricitons, Increased muscle spasms, Hypomobility, Decreased mobility, Decreased range of motion, Decreased activity tolerance  Visit Diagnosis: Acute bilateral low back pain without sciatica  Other symptoms and signs involving the musculoskeletal system  Abnormal posture     Problem List Patient Active Problem List   Diagnosis Date Noted  . Lumbar strain 09/02/2017  . Trigger finger of left hand 05/26/2017  . Paresthesia 06/23/2016  . OSA (obstructive sleep apnea) 06/23/2016  . B12 deficiency 01/21/2016  . Steroid-induced adrenal suppression ([place] 01/21/2016  . Diabetes with neurologic complications ([place] 064/40/3474 . Hyperlipidemia LDL goal <70 12/17/2015  . Essential hypertension 12/17/2015  . Obese 12/17/2015  .  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  Insomnia 12/17/2015   Kerin Perna, PTA 10/11/17 12:23 PM  [employer] [facility] [place], [place], [address] Phone: [phone]   Fax:  [phone]  Name: Luis Fernandez MRN: [id number] Date of Birth: 11/04/1945

## 2017-10-13 ENCOUNTER — Ambulatory Visit (INDEPENDENT_AMBULATORY_CARE_PROVIDER_SITE_OTHER): Payer: Medicare Other | Admitting: Rehabilitative and Restorative Service Providers"

## 2017-10-13 ENCOUNTER — Encounter: Payer: Self-pay | Admitting: Rehabilitative and Restorative Service Providers"

## 2017-10-13 DIAGNOSIS — R293 Abnormal posture: Secondary | ICD-10-CM

## 2017-10-13 DIAGNOSIS — M545 Low back pain, unspecified: Secondary | ICD-10-CM

## 2017-10-13 DIAGNOSIS — R29898 Other symptoms and signs involving the musculoskeletal system: Secondary | ICD-10-CM

## 2017-10-13 NOTE — Patient Instructions (Addendum)
Strengthening: Hip Abduction - no resistance     With tubing around right leg, other side toward anchor, extend leg out from side. Repeat __10__ times per set. Do __2-3__ sets per session. Do __1__ sessions per day.   Strengthening: Hip Extension - no resistance     With tubing around right ankle, face anchor and pull leg straight back. Repeat __10__ times per set. Do _2-3___ sets per session. Do __1__ sessions per day.   Strengthening: Hip Abductor - Resisted    With band looped around both legs above knees, push thighs apart. Repeat _10___ times per set. Do _2-3___ sets per session. Do ___1_ sessions per day.

## 2017-10-13 NOTE — Therapy (Signed)
[facility] [place] [address] [place], [place], [address] Phone: [phone]   Fax:  [phone]  Physical Therapy Treatment  Patient Details  Name: Luis Fernandez MRN: [id number] Date of Birth: Mar 13, 1946 Referring Provider: Dr. [name]    Encounter Date: 10/13/2017  PT End of Session - 10/13/17 1110    Visit Number  5    Number of Visits  12    Date for PT Re-Evaluation  11/11/17    PT Start Time  1104    PT Stop Time  1200    PT Time Calculation (min)  56 min       Past Medical History:  Diagnosis Date  . Diabetes mellitus without complication (HCC)   . Hypertension   . 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 CARPAL TUNNEL RELEASE    . CATARACT EXTRACTION    . NOSE SURGERY    . TOTAL KNEE ARTHROPLASTY    . UMBILICAL HERNIA REPAIR      There were no vitals filed for this visit.  Subjective Assessment - 10/13/17 1110    Subjective  Good response to DN - still much better.     Currently in Pain?  Yes    Pain Score  2     Pain Location  Hip    Pain Orientation  Right    Pain Descriptors / Indicators  Sore;Tightness    Pain Type  Chronic pain    Pain Radiating Towards  into Rt buttock    Pain Onset  More than a month ago    Pain Frequency  Intermittent                      [facility] Adult PT Treatment/Exercise - 10/13/17 0001      Lumbar Exercises: Stretches   Passive Hamstring Stretch  Right;Left;2 reps;30 seconds supine with strap     ITB Stretch  Right;Left;2 reps;30 seconds supine with strap     Piriformis Stretch  Right;Left;2 reps;30 seconds supine with strap - travell       Lumbar Exercises: Aerobic   Nustep  L5:  arms/legs x 6 min  PTA present to discuss progress      Lumbar Exercises: Standing   Other Standing Lumbar Exercises  standing hip abduction leading with heel; hip extension  alternating LE's 10 reps x 2 sets       Lumbar Exercises: Supine   Other Supine Lumbar Exercises  clam with green TB alternating LE's holding one side still moving opposite 10 reps x 2 sets       Moist Heat Therapy   Number Minutes Moist Heat  20 Minutes    Moist Heat Location  Lumbar Spine;Hip      Electrical Stimulation   Electrical Stimulation Location  Rt posterior hip    Electrical Stimulation Action  IFC    Electrical Stimulation Parameters  to tolerance    Electrical Stimulation Goals  Pain;Tone      Manual Therapy   Manual therapy comments  pt prone    Joint Mobilization  PA mobs Rt hip - pt prone    Soft tissue mobilization  Rt > Lt lumbar QL; piriformis/gluts; Lt > Rt thoracic paraspinals        Trigger Point Dry Needling - 10/13/17 1205    Consent Given?  Yes    Muscles Treated Lower Body  --  Rt with estim; Lt piriformis x2; mid thoracic x 3 no stim Lt    Longissimus Response  Palpable increased muscle length Lt thoracic     Gluteus Maximus Response  Palpable increased muscle length    Gluteus Minimus Response  Palpable increased muscle length    Piriformis Response  Palpable increased muscle length;Twitch response elicited           PT Education - 10/13/17 1126    Education provided  Yes  (Pended)     Education Details  HEP   (Pended)     Person(s) Educated  Patient  (Pended)     Methods  Explanation;Demonstration;Tactile cues;Verbal cues;Handout  (Pended)     Comprehension  Verbalized understanding;Returned demonstration;Verbal cues required;Tactile cues required  (Pended)           PT Long Term Goals - 10/11/17 1222      PT LONG TERM GOAL #1   Title  Decrease LBP and hip pain by 50-75% allowing patient to return to normal functional activities 11/11/17    Time  6    Period  Weeks    Status  Achieved      PT LONG TERM GOAL #2   Title  Increase spinal mobility allowing patient to perform trunk ROM with decreased discomfort and improved ROM/mobility  11/11/17    Time  6    Period  Weeks    Status  On-going      PT LONG TERM GOAL #3   Title  Patient to report and demonstrate improved body mechanics for functional activity and postures 11/11/17    Time  6    Period  Weeks    Status  On-going      PT LONG TERM GOAL #4   Title  Independent in HEP with instruction in return to community based exercise program 11/11/17    Time  6    Period  Weeks    Status  On-going      PT LONG TERM GOAL #5   Title  Improve FOTO to </=39% limitation 11/11/17    Time  6    Period  Weeks    Status  On-going            Plan - 10/13/17 1207    Clinical Impression Statement  Continued improvement in Rt lumbar and hip pain. Patient has incresaed mobility and reports decreased pain and improved functional activity level. He has decreased palpable tightness through the posterior Rt hip. Added exercise without difficulty. Progressing well toward stated goals of therapy.     Rehab Potential  Good    PT Frequency  2x / week    PT Duration  6 weeks    PT Treatment/Interventions  Patient/family education;ADLs/Self Care Home Management;Cryotherapy;Electrical Stimulation;Iontophoresis 4mg /ml Dexamethasone;Moist Heat;Ultrasound;Dry needling;Manual techniques;Neuromuscular re-education;Therapeutic activities;Therapeutic exercise    PT Next Visit Plan  DN and continue spinal stabilization.     Consulted and Agree with Plan of Care  Patient       Patient will benefit from skilled therapeutic intervention in order to improve the following deficits and impairments:  Postural dysfunction, Improper body mechanics, Pain, Increased fascial restricitons, Increased muscle spasms, Hypomobility, Decreased mobility, Decreased range of motion, Decreased activity tolerance  Visit Diagnosis: Acute bilateral low back pain without sciatica  Other symptoms and signs involving the musculoskeletal system  Abnormal posture     Problem List Patient Active Problem List    Diagnosis Date Noted  . Lumbar strain 09/02/2017  . Trigger  finger of left hand 05/26/2017  . Paresthesia 06/23/2016  . OSA (obstructive sleep apnea) 06/23/2016  . B12 deficiency 01/21/2016  . 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 Hyperlipidemia LDL goal <70 12/17/2015  . Essential hypertension 12/17/2015  . Obese 12/17/2015  . 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 Insomnia 12/17/2015    Paitynn Mikus Rober Minion PT, MPH  10/13/2017, 12:09 PM  [facility] [address] [place], [place], [address] Phone: [phone]   Fax:  [phone]  Name: Luis Fernandez MRN: [id number] Date of Birth: February 15, 1946

## 2017-10-18 ENCOUNTER — Encounter: Payer: Self-pay | Admitting: Family Medicine

## 2017-10-18 ENCOUNTER — Encounter: Payer: Self-pay | Admitting: Rehabilitative and Restorative Service Providers"

## 2017-10-18 ENCOUNTER — Ambulatory Visit (INDEPENDENT_AMBULATORY_CARE_PROVIDER_SITE_OTHER): Payer: Medicare Other | Admitting: Rehabilitative and Restorative Service Providers"

## 2017-10-18 DIAGNOSIS — R29898 Other symptoms and signs involving the musculoskeletal system: Secondary | ICD-10-CM

## 2017-10-18 DIAGNOSIS — M545 Low back pain, unspecified: Secondary | ICD-10-CM

## 2017-10-18 DIAGNOSIS — R293 Abnormal posture: Secondary | ICD-10-CM | POA: Diagnosis not present

## 2017-10-18 NOTE — Therapy (Signed)
[facility] [place] [address] [place], [place], [address] Phone: [phone]   Fax:  [phone]  Physical Therapy Treatment  Patient Details  Name: Luis Fernandez MRN: [id number] Date of Birth: [birth year] Referring Provider: Dr [name]    Encounter Date: 10/18/2017  PT End of Session - 10/18/17 0935    Visit Number  6    Number of Visits  12    Date for PT Re-Evaluation  11/11/17    PT Start Time  0933    PT Stop Time  1029    PT Time Calculation (min)  56 min    Activity Tolerance  Patient tolerated treatment well       Past Medical History:  Diagnosis Date  . Diabetes mellitus without complication (HCC)   . Hypertension   . 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 CARPAL TUNNEL RELEASE    . CATARACT EXTRACTION    . NOSE SURGERY    . TOTAL KNEE ARTHROPLASTY    . UMBILICAL HERNIA REPAIR      There were no vitals filed for this visit.  Subjective Assessment - 10/18/17 0936    Subjective  Still some soreness but continued improvement     Currently in Pain?  Yes    Pain Score  2     Pain Location  Hip    Pain Orientation  Right    Pain Descriptors / Indicators  Sore;Tightness    Pain Type  Chronic pain    Pain Onset  More than a month ago    Pain Frequency  Intermittent         [facility] PT Assessment - 10/18/17 0001      Assessment   Medical Diagnosis  LBP with pain into bilat buttocks Rt > Lt     Referring Provider  Dr [name]     Onset Date/Surgical Date  08/17/17    Hand Dominance  Right    Next MD Visit  6/19      Flexibility   Hamstrings  Rt 70 deg; Lt 65 deg     Quadriceps  tight Rt 100 deg; Lt 90 deg     ITB  tight Rt > Lt     Piriformis  tight Rt > Lt       Palpation   Palpation comment  muscular tightness noted through the Rt > Lt lumbar paraspinals, QL; lats; piriformis; glut med/min                    [facility] Adult PT Treatment/Exercise - 10/18/17 0001      Lumbar Exercises: Stretches   ITB Stretch  Right;Left;2 reps;30 seconds supine with strap     Piriformis Stretch  Right;Left;2 reps;30 seconds supine with strap - travell       Lumbar Exercises: Standing   Other Standing Lumbar Exercises  standing hip abduction leading with heel; hip extension alternating LE's 10 reps x 2 sets       Lumbar Exercises: Supine   Bridge  10 reps    Other Supine Lumbar Exercises  clam with green TB alternating LE's holding one side still moving opposite 10 reps x 2 sets       Moist Heat Therapy   Number Minutes Moist Heat  20 Minutes    Moist Heat Location  Lumbar Spine;Hip      Electrical Stimulation  Electrical Stimulation Location  Rt posterior hip    Electrical Stimulation Action  IFC    Electrical Stimulation Parameters  to tolerance    Electrical Stimulation Goals  Pain;Tone      Manual Therapy   Manual therapy comments  pt prone    Joint Mobilization  PA mobs Rt hip - pt prone    Soft tissue mobilization  Rt > Lt lumbar QL; piriformis/gluts; Lt > Rt thoracic paraspinals        Trigger Point Dry Needling - 10/18/17 0955    Consent Given?  Yes    Muscles Treated Lower Body  -- Rt side with estim     Gluteus Maximus Response  Palpable increased muscle length    Gluteus Minimus Response  Palpable increased muscle length    Piriformis Response  Palpable increased muscle length    Tensor Fascia Lata Response  Palpable increased muscle length                PT Long Term Goals - 10/18/17 0935      PT LONG TERM GOAL #1   Title  Decrease LBP and hip pain by 50-75% allowing patient to return to normal functional activities 11/11/17    Time  6    Period  Weeks    Status  Achieved      PT LONG TERM GOAL #2   Title  Increase spinal mobility allowing patient to perform trunk ROM with decreased discomfort and improved ROM/mobility 11/11/17    Time  6     Period  Weeks    Status  On-going      PT LONG TERM GOAL #3   Title  Patient to report and demonstrate improved body mechanics for functional activity and postures 11/11/17    Time  6    Period  Days    Status  On-going      PT LONG TERM GOAL #4   Title  Independent in HEP with instruction in return to community based exercise program 11/11/17    Time  6    Period  Weeks    Status  On-going      PT LONG TERM GOAL #5   Title  Improve FOTO to </=39% limitation 11/11/17    Time  6    Period  Weeks    Status  On-going            Plan - 10/18/17 0957    Clinical Impression Statement  Improving - less muscular tightness and decreased pain. Good increase in mobility through Rt hip. Progressing well toward stated goals of therapy.     Rehab Potential  Good    PT Frequency  2x / week    PT Duration  6 weeks    PT Treatment/Interventions  Patient/family education;ADLs/Self Care Home Management;Cryotherapy;Electrical Stimulation;Iontophoresis 4mg /ml Dexamethasone;Moist Heat;Ultrasound;Dry needling;Manual techniques;Neuromuscular re-education;Therapeutic activities;Therapeutic exercise    PT Next Visit Plan  DN and continue spinal stabilization.        Patient will benefit from skilled therapeutic intervention in order to improve the following deficits and impairments:  Postural dysfunction, Improper body mechanics, Pain, Increased fascial restricitons, Increased muscle spasms, Hypomobility, Decreased mobility, Decreased range of motion, Decreased activity tolerance  Visit Diagnosis: Acute bilateral low back pain without sciatica  Other symptoms and signs involving the musculoskeletal system  Abnormal posture     Problem List Patient Active Problem List   Diagnosis Date Noted  . Lumbar strain 09/02/2017  . Trigger finger of  left hand 05/26/2017  . Paresthesia 06/23/2016  . OSA (obstructive sleep apnea) 06/23/2016  . B12 deficiency 01/21/2016  . Steroid-induced adrenal  suppression (HCC) 01/21/2016  . 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 Hyperlipidemia LDL goal <70 12/17/2015  . Essential hypertension 12/17/2015  . Obese 12/17/2015  . 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 Insomnia 12/17/2015    Sully Dyment Rober Minion PT, MPH  10/18/2017, 10:12 AM  [facility] [address] [place], [place], [address] Phone: [phone]   Fax:  [phone]  Name: Luis Fernandez MRN: [id number] Date of Birth: 04/03/1946

## 2017-10-20 ENCOUNTER — Ambulatory Visit (INDEPENDENT_AMBULATORY_CARE_PROVIDER_SITE_OTHER): Payer: Medicare Other | Admitting: Rehabilitative and Restorative Service Providers"

## 2017-10-20 ENCOUNTER — Encounter: Payer: Self-pay | Admitting: Rehabilitative and Restorative Service Providers"

## 2017-10-20 DIAGNOSIS — R29898 Other symptoms and signs involving the musculoskeletal system: Secondary | ICD-10-CM | POA: Diagnosis not present

## 2017-10-20 DIAGNOSIS — M545 Low back pain, unspecified: Secondary | ICD-10-CM

## 2017-10-20 DIAGNOSIS — R293 Abnormal posture: Secondary | ICD-10-CM | POA: Diagnosis not present

## 2017-10-20 NOTE — Patient Instructions (Signed)
Strengthening: Wall Slide   Engage core  Leaning on wall(or large ball), slowly lower buttocks until thighs are parallel to floor. Do not bring knees over toes. Hold __20-30__ seconds. Tighten thigh muscles and return. Repeat __10__ times per set. Do __1-2__ sets per session. Do __1__ sessions per day.

## 2017-10-20 NOTE — Therapy (Addendum)
[facility] [place] [address] [place] [place], [place], [address] Phone: [phone]   Fax:  [phone]  Physical Therapy Treatment  Patient Details  Name: Luis Fernandez MRN: [id number] Date of Birth: September 17, 1945 Referring Provider: Dr [name]    Encounter Date: 10/20/2017  PT End of Session - 10/20/17 1023    Visit Number  7    Number of Visits  12    Date for PT Re-Evaluation  11/11/17    PT Start Time  1017    PT Stop Time  1110    PT Time Calculation (min)  53 min    Activity Tolerance  Patient tolerated treatment well       Past Medical History:  Diagnosis Date  . Diabetes mellitus without complication (HCC)   . Hypertension   . 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 CARPAL TUNNEL RELEASE    . CATARACT EXTRACTION    . NOSE SURGERY    . TOTAL KNEE ARTHROPLASTY    . UMBILICAL HERNIA REPAIR      There were no vitals filed for this visit.  Subjective Assessment - 10/20/17 1036    Subjective  Cotninues to improve. He was really sore after last needling session but is now feeling fine. Wants to continue with DN today because that is what helps the most. He is working on his exercises at home and notices he is sleeping better, moving better. He can walk his dog with less discomfort for longer distances. He has returned to his functional activities. He is working a little bit in the yard. Pleased with progress.     Currently in Pain?  Yes    Pain Score  2     Pain Location  Hip    Pain Orientation  Right;Posterior    Pain Descriptors / Indicators  Sore;Tightness    Pain Type  Chronic pain    Pain Onset  More than a month ago    Pain Frequency  Intermittent                      [facility] Adult PT Treatment/Exercise - 10/20/17 0001      Lumbar Exercises: Stretches   ITB Stretch  Right;Left;2 reps;30 seconds  supine with strap     Piriformis Stretch  Right;Left;2 reps;30 seconds supine with strap - travell       Lumbar Exercises: Standing   Wall Slides  10 reps with large green ball       Moist Heat Therapy   Number Minutes Moist Heat  20 Minutes    Moist Heat Location  Lumbar Spine;Hip      Electrical Stimulation   Electrical Stimulation Location  Rt posterior hip    Electrical Stimulation Action  IFC    Electrical Stimulation Parameters  to tolerance    Electrical Stimulation Goals  Pain;Tone       Trigger Point Dry Needling - 10/20/17 1042    Consent Given?  Yes    Muscles Treated Lower Body  -- Rt with estims - QL     Gluteus Maximus Response  Palpable increased muscle length    Gluteus Minimus Response  Palpable increased muscle length    Piriformis Response  Palpable increased muscle length    Tensor Fascia Lata Response  Palpable increased muscle length       patient received deep tissue  mobilization through posterior Rt hip in piriformis and glut musculature prior to modalities     PT Education - 10/20/17 1045    Education provided  Yes    Education Details  HEP     Person(s) Educated  Patient    Methods  Explanation;Demonstration;Tactile cues;Verbal cues;Handout    Comprehension  Verbalized understanding;Returned demonstration;Verbal cues required;Tactile cues required          PT Long Term Goals - 10/18/17 0935      PT LONG TERM GOAL #1   Title  Decrease LBP and hip pain by 50-75% allowing patient to return to normal functional activities 11/11/17    Time  6    Period  Weeks    Status  Achieved      PT LONG TERM GOAL #2   Title  Increase spinal mobility allowing patient to perform trunk ROM with decreased discomfort and improved ROM/mobility 11/11/17    Time  6    Period  Weeks    Status  On-going      PT LONG TERM GOAL #3   Title  Patient to report and demonstrate improved body mechanics for functional activity and postures 11/11/17    Time  6    Period   Days    Status  On-going      PT LONG TERM GOAL #4   Title  Independent in HEP with instruction in return to community based exercise program 11/11/17    Time  6    Period  Weeks    Status  On-going      PT LONG TERM GOAL #5   Title  Improve FOTO to </=39% limitation 11/11/17    Time  6    Period  Weeks    Status  On-going            Plan - 10/20/17 1045    Clinical Impression Statement  continued improvement. Patient is compliant with HEP and is increasing activity level at home. He has less palpable tightness through the Rt posterior hip. Excellent response to DN, manual work, exercise and modalities.     Rehab Potential  Good    PT Frequency  2x / week    PT Duration  6 weeks    PT Treatment/Interventions  Patient/family education;ADLs/Self Care Home Management;Cryotherapy;Electrical Stimulation;Iontophoresis 4mg /ml Dexamethasone;Moist Heat;Ultrasound;Dry needling;Manual techniques;Neuromuscular re-education;Therapeutic activities;Therapeutic exercise    PT Next Visit Plan  DN and continue spinal stabilization.     Consulted and Agree with Plan of Care  Patient       Patient will benefit from skilled therapeutic intervention in order to improve the following deficits and impairments:  Postural dysfunction, Improper body mechanics, Pain, Increased fascial restricitons, Increased muscle spasms, Hypomobility, Decreased mobility, Decreased range of motion, Decreased activity tolerance  Visit Diagnosis: Acute bilateral low back pain without sciatica  Other symptoms and signs involving the musculoskeletal system  Abnormal posture     Problem List Patient Active Problem List   Diagnosis Date Noted  . Lumbar strain 09/02/2017  . Trigger finger of left hand 05/26/2017  . Paresthesia 06/23/2016  . OSA (obstructive sleep apnea) 06/23/2016  . B12 deficiency 01/21/2016  . 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 Diabetes with neurologic complications (HCC)  12/17/2015  . Hyperlipidemia LDL goal <70 12/17/2015  . Essential hypertension 12/17/2015  . Obese 12/17/2015  . 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 Insomnia 12/17/2015    Burnis Kaser Rober MinionP Willmer Fellers PT, MPH  10/20/2017, 11:15 AM  [facility] [place] [address] [place] [address] [place], [place], [address] Phone: [phone]   Fax:  [phone]  Name: Luis Fernandez MRN: [id number] Date of Birth: [birth year]

## 2017-10-26 ENCOUNTER — Encounter: Payer: Self-pay | Admitting: Rehabilitative and Restorative Service Providers"

## 2017-10-26 ENCOUNTER — Ambulatory Visit (INDEPENDENT_AMBULATORY_CARE_PROVIDER_SITE_OTHER): Payer: Medicare Other | Admitting: Rehabilitative and Restorative Service Providers"

## 2017-10-26 DIAGNOSIS — R293 Abnormal posture: Secondary | ICD-10-CM

## 2017-10-26 DIAGNOSIS — M545 Low back pain, unspecified: Secondary | ICD-10-CM

## 2017-10-26 DIAGNOSIS — R29898 Other symptoms and signs involving the musculoskeletal system: Secondary | ICD-10-CM | POA: Diagnosis not present

## 2017-10-26 NOTE — Therapy (Signed)
[facility] [place] [address] [place] [address] [place], [place], [address] Phone: [phone]   Fax:  [phone]  Physical Therapy Treatment  Patient Details  Name: Luis Fernandez MRN: [id number] Date of Birth: 1945-12-19 Referring Provider: Dr [name]   Encounter Date: 10/26/2017  PT End of Session - 10/26/17 0844    Visit Number  8    Number of Visits  12    Date for PT Re-Evaluation  11/11/17    PT Start Time  0844    PT Stop Time  0938    PT Time Calculation (min)  54 min    Activity Tolerance  Patient tolerated treatment well       Past Medical History:  Diagnosis Date  . Diabetes mellitus without complication (HCC)   . Hypertension   . 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 CARPAL TUNNEL RELEASE    . CATARACT EXTRACTION    . NOSE SURGERY    . TOTAL KNEE ARTHROPLASTY    . UMBILICAL HERNIA REPAIR      There were no vitals filed for this visit.  Subjective Assessment - 10/26/17 0845    Subjective  Some soreness in the Rt hip - just that one muscle. May be the cold weather. Would like to continue with the Dn another time and maybe try on his own.     Currently in Pain?  Yes    Pain Score  2     Pain Location  Hip    Pain Orientation  Right;Posterior    Pain Descriptors / Indicators  Sore;Tightness    Pain Type  Chronic pain         [facility] PT Assessment - 10/26/17 0001      Assessment   Medical Diagnosis  LBP with pain into bilat buttocks Rt > Lt     Referring Provider  Dr [name]    Onset Date/Surgical Date  08/17/17    Hand Dominance  Right    Next MD Visit  6/19      AROM   Lumbar Flexion  75%    Lumbar Extension  15%    Lumbar - Right Side Bend  70%    Lumbar - Left Side Bend  65%    Lumbar - Right Rotation  25%    Lumbar - Left Rotation  25%      Flexibility   Hamstrings  Rt 70 deg; Lt 65 deg     Quadriceps  tight Rt 100 deg; Lt 90 deg     ITB  tight Rt > Lt     Piriformis  tight Rt > Lt       Palpation   Palpation comment  improving muscular tightness noted through the Rt > Lt lumbar paraspinals, QL; lats; piriformis; glut med/min             No data recorded       [facility] Adult PT Treatment/Exercise - 10/26/17 0001      Lumbar Exercises: Stretches   Prone on Elbows Stretch  -- ~2 min     Press Ups  10 reps 2-3 sec     ITB Stretch  Right;Left;2 reps;30 seconds supine with strap       Moist Heat Therapy   Number Minutes Moist Heat  20 Minutes    Moist Heat Location  Lumbar Spine;Hip  Programme researcher, broadcasting/film/video Location  Rt posterior hip    Electrical Stimulation Action  IFC    Electrical Stimulation Parameters  to tolerance     Electrical Stimulation Goals  Pain;Tone      Manual Therapy   Manual therapy comments  pt prone    Joint Mobilization  PA mobs Rt hip - pt prone    Soft tissue mobilization  Rt > Lt lumbar QL; piriformis/gluts; Lt > Rt thoracic paraspinals        Trigger Point Dry Needling - 10/26/17 0914    Consent Given?  Yes    Muscles Treated Lower Body  -- Rt with estim     Gluteus Maximus Response  Palpable increased muscle length    Gluteus Minimus Response  Palpable increased muscle length;Twitch response elicited    Piriformis Response  Palpable increased muscle length;Twitch response elicited    Tensor Fascia Lata Response  Palpable increased muscle length           PT Education - 10/26/17 0912    Education provided  Yes    Education Details  HEP     Person(s) Educated  Patient    Methods  Explanation;Demonstration;Tactile cues;Verbal cues;Handout    Comprehension  Verbalized understanding;Returned demonstration;Verbal cues required;Tactile cues required          PT Long Term Goals - 10/18/17 0935      PT LONG TERM GOAL #1   Title  Decrease LBP and hip pain by 50-75% allowing patient to return to  normal functional activities 11/11/17    Time  6    Period  Weeks    Status  Achieved      PT LONG TERM GOAL #2   Title  Increase spinal mobility allowing patient to perform trunk ROM with decreased discomfort and improved ROM/mobility 11/11/17    Time  6    Period  Weeks    Status  On-going      PT LONG TERM GOAL #3   Title  Patient to report and demonstrate improved body mechanics for functional activity and postures 11/11/17    Time  6    Period  Days    Status  On-going      PT LONG TERM GOAL #4   Title  Independent in HEP with instruction in return to community based exercise program 11/11/17    Time  6    Period  Weeks    Status  On-going      PT LONG TERM GOAL #5   Title  Improve FOTO to </=39% limitation 11/11/17    Time  6    Period  Weeks    Status  On-going            Plan - 10/26/17 0855    Clinical Impression Statement  Improving trunk and LE mobility; decreasing muscular tightness to palpation; increasing activity level. Progressing well toward stated goals of therapy.     Rehab Potential  Good    PT Frequency  2x / week    PT Duration  6 weeks    PT Treatment/Interventions  Patient/family education;ADLs/Self Care Home Management;Cryotherapy;Electrical Stimulation;Iontophoresis 4mg /ml Dexamethasone;Moist Heat;Ultrasound;Dry needling;Manual techniques;Neuromuscular re-education;Therapeutic activities;Therapeutic exercise    PT Next Visit Plan  DN and continue spinal stabilization. Decrease to 1x/wk     Consulted and Agree with Plan of Care  Patient       Patient will benefit from skilled therapeutic intervention in order to improve the following deficits  and impairments:  Postural dysfunction, Improper body mechanics, Pain, Increased fascial restricitons, Increased muscle spasms, Hypomobility, Decreased mobility, Decreased range of motion, Decreased activity tolerance  Visit Diagnosis: Acute bilateral low back pain without sciatica  Other symptoms and signs  involving the musculoskeletal system  Abnormal posture     Problem List Patient Active Problem List   Diagnosis Date Noted  . Lumbar strain 09/02/2017  . Trigger finger of left hand 05/26/2017  . Paresthesia 06/23/2016  . OSA (obstructive sleep apnea) 06/23/2016  . B12 deficiency 01/21/2016  . 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 Hyperlipidemia LDL goal <70 12/17/2015  . Essential hypertension 12/17/2015  . Obese 12/17/2015  . 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 Insomnia 12/17/2015    Luis Fernandez PT, MPH  10/26/2017, 9:32 AM  [facility] [address] [place] [address] [place], [place], [address] Phone: [phone]   Fax:  [phone]  Name: Luis Fernandez MRN: [id number] Date of Birth: 06-22-1946

## 2017-10-26 NOTE — Patient Instructions (Signed)
Trunk: Prone Extension (Press-Ups)    Lie on stomach on firm, flat surface. Relax bottom and legs. Raise chest in air with elbows straight. Keep hips flat on surface, sag stomach. Hold _2-3___ seconds. Repeat _10___ times. Do _1-2___ sessions per day. CAUTION: Movement should be gentle and slow.  Elbow Prop (Extension)    Prop body up on elbows for __2-5 minutes . Slowly lower it. Repeat _1___ times. Do __1-2__ sessions per day.

## 2017-10-27 ENCOUNTER — Other Ambulatory Visit: Payer: Self-pay | Admitting: Family Medicine

## 2017-10-27 DIAGNOSIS — E1142 Type 2 diabetes mellitus with diabetic polyneuropathy: Secondary | ICD-10-CM

## 2017-10-29 ENCOUNTER — Encounter: Payer: Medicare Other | Admitting: Rehabilitative and Restorative Service Providers"

## 2017-11-02 ENCOUNTER — Encounter: Payer: Self-pay | Admitting: Rehabilitative and Restorative Service Providers"

## 2017-11-02 ENCOUNTER — Ambulatory Visit (INDEPENDENT_AMBULATORY_CARE_PROVIDER_SITE_OTHER): Payer: Medicare Other | Admitting: Rehabilitative and Restorative Service Providers"

## 2017-11-02 DIAGNOSIS — R293 Abnormal posture: Secondary | ICD-10-CM

## 2017-11-02 DIAGNOSIS — R29898 Other symptoms and signs involving the musculoskeletal system: Secondary | ICD-10-CM | POA: Diagnosis not present

## 2017-11-02 DIAGNOSIS — M545 Low back pain, unspecified: Secondary | ICD-10-CM

## 2017-11-02 NOTE — Therapy (Addendum)
[place], [place], [address] Phone: [phone]   Fax:  [phone]  Physical Therapy Treatment  Patient Details  Name: Luis Fernandez MRN: [id number] Date of Birth: 09-12-1945 Referring Provider: Dr Lynne Leader    Encounter Date: 11/02/2017  PT End of Session - 11/02/17 1020    Visit Number  9    Number of Visits  12    Date for PT Re-Evaluation  11/11/17    PT Start Time  1020    PT Stop Time  1115    PT Time Calculation (min)  55 min    Activity Tolerance  Patient tolerated treatment well       Past Medical History:  Diagnosis Date  . Diabetes mellitus without complication ([place])   . Hypertension   . Steroid-induced adrenal suppression ([place]) 01/21/2016   On medical reccords     Past Surgical History:  Procedure Laterality Date  . CARPAL TUNNEL RELEASE    . CATARACT EXTRACTION    . NOSE SURGERY    . TOTAL KNEE ARTHROPLASTY    . UMBILICAL HERNIA REPAIR      There were no vitals filed for this visit.  Subjective Assessment - 11/02/17 1021    Subjective  Patient feels good. he has only occasional discomfort no more than 2/10 in the Rt posterior hip - worse with cold weather and when he overdoes his physical activities. Pleased with progress and feels confident in continuing with independent HEP. Will call to schedule additional visits as needed.     Currently in Pain?  Yes    Pain Score  2     Pain Location  Hip    Pain Orientation  Right;Posterior    Pain Type  Chronic pain    Pain Onset  More than a month ago    Pain Frequency  Intermittent         [facility] PT Assessment - 11/02/17 0001      Assessment   Medical Diagnosis  LBP with pain into bilat buttocks Rt > Lt     Referring Provider  Dr Lynne Leader     Onset Date/Surgical Date  08/17/17    Hand Dominance  Right    Next MD Visit  6/19      Observation/Other Assessments   Focus on Therapeutic Outcomes (FOTO)   31%  limitation       AROM   Lumbar Flexion  805    Lumbar Extension  20%    Lumbar - Right Side Bend  70%    Lumbar - Left Side Bend  65%    Lumbar - Right Rotation  25%    Lumbar - Left Rotation  25%      Flexibility   Hamstrings  Rt 70 deg; Lt 65 deg     Quadriceps  tight Rt 100 deg; Lt 90 deg     ITB  tight Rt > Lt     Piriformis  tight Rt > Lt       Palpation   Palpation comment  improving muscular tightness noted through the Rt > Lt lumbar paraspinals, QL; lats; piriformis; glut med/min                    [facility] Adult PT Treatment/Exercise - 11/02/17 0001      Lumbar Exercises: Stretches   Prone on Elbows Stretch  -- ~2 min     Press Ups  10 reps 2-3  sec     Piriformis Stretch  Right;Left;2 reps;30 seconds      Moist Heat Therapy   Number Minutes Moist Heat  20 Minutes    Moist Heat Location  Lumbar Spine;Hip      Electrical Stimulation   Electrical Stimulation Location  Rt posterior hip    Electrical Stimulation Action  IFC    Electrical Stimulation Parameters  to tolerance    Electrical Stimulation Goals  Pain;Tone      Manual Therapy   Manual therapy comments  pt prone    Joint Mobilization  PA mobs Rt hip - pt prone    Soft tissue mobilization  Rt > Lt lumbar QL; piriformis/gluts; Lt > Rt thoracic paraspinals        Trigger Point Dry Needling - 11/02/17 1037    Consent Given?  Yes    Muscles Treated Lower Body  -- Rt with estim     Gluteus Maximus Response  Palpable increased muscle length    Gluteus Minimus Response  Palpable increased muscle length    Piriformis Response  Palpable increased muscle length    Tensor Fascia Lata Response  Palpable increased muscle length                PT Long Term Goals - 11/02/17 1038      PT LONG TERM GOAL #1   Title  Decrease LBP and hip pain by 50-75% allowing patient to return to normal functional activities 11/11/17    Time  6    Period  Weeks    Status  Achieved      PT LONG TERM GOAL #2    Title  Increase spinal mobility allowing patient to perform trunk ROM with decreased discomfort and improved ROM/mobility 11/11/17    Time  6    Period  Weeks    Status  Achieved      PT LONG TERM GOAL #3   Title  Patient to report and demonstrate improved body mechanics for functional activity and postures 11/11/17    Time  6    Period  Weeks    Status  Achieved      PT LONG TERM GOAL #4   Title  Independent in HEP with instruction in return to community based exercise program 11/11/17    Time  6    Period  Weeks    Status  Achieved      PT LONG TERM GOAL #5   Title  Improve FOTO to </=39% limitation 11/11/17    Time  6    Period  Weeks    Status  Achieved            Plan - 11/02/17 1038    Clinical Impression Statement  Excellent progress overall. Patient report 95% to 98% improvement since beginning therapy. He is consistent with HEP and understands the importance of continuing with HEP. Goals of therapy have been accomplished. Patient will call with questions or concerns or if he wants to schedule in the next two weeks.     Rehab Potential  Good    PT Frequency  2x / week    PT Duration  6 weeks    PT Treatment/Interventions  Patient/family education;ADLs/Self Care Home Management;Cryotherapy;Electrical Stimulation;Iontophoresis 35m/ml Dexamethasone;Moist Heat;Ultrasound;Dry needling;Manual techniques;Neuromuscular re-education;Therapeutic activities;Therapeutic exercise    PT Next Visit Plan  Patient will call to schedule in the next two weeks as needed. D/c if he does not reschedule.     Consulted and Agree with  Plan of Care  Patient       Patient will benefit from skilled therapeutic intervention in order to improve the following deficits and impairments:  Postural dysfunction, Improper body mechanics, Pain, Increased fascial restricitons, Increased muscle spasms, Hypomobility, Decreased mobility, Decreased range of motion, Decreased activity tolerance  Visit  Diagnosis: Acute bilateral low back pain without sciatica  Other symptoms and signs involving the musculoskeletal system  Abnormal posture     Problem List Patient Active Problem List   Diagnosis Date Noted  . Lumbar strain 09/02/2017  . Trigger finger of left hand 05/26/2017  . Paresthesia 06/23/2016  . OSA (obstructive sleep apnea) 06/23/2016  . B12 deficiency 01/21/2016  . Steroid-induced adrenal suppression ([place]) 01/21/2016  . Diabetes with neurologic complications ([place]) 58/52/7782  . Hyperlipidemia LDL goal <70 12/17/2015  . Essential hypertension 12/17/2015  . Obese 12/17/2015  . 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 Insomnia 12/17/2015    Celyn Nilda Simmer PT, MPH  11/02/2017, 11:02 AM  [facility] [place], [place], [address] Phone: [phone]   Fax:  [phone]  Name: Luis Fernandez MRN: [id number] Date of Birth: 1946-04-17  PHYSICAL THERAPY DISCHARGE SUMMARY  Visits from Start of Care: 9  Current functional level related to goals / functional outcomes: See last progress note for discharge status    Remaining deficits: Unknown    Education / Equipment: HEP  Plan: Patient agrees to discharge.  Patient goals were met. Patient is being discharged due to meeting the stated rehab goals.  ?????     Celyn P. Helene Kelp PT, MPH 12/09/17 2:33 PM

## 2017-11-06 ENCOUNTER — Other Ambulatory Visit: Payer: Self-pay | Admitting: Family Medicine

## 2017-12-08 ENCOUNTER — Other Ambulatory Visit: Payer: Self-pay | Admitting: Family Medicine

## 2017-12-08 DIAGNOSIS — E785 Hyperlipidemia, unspecified: Secondary | ICD-10-CM

## 2017-12-10 ENCOUNTER — Other Ambulatory Visit: Payer: Self-pay | Admitting: Family Medicine

## 2017-12-10 DIAGNOSIS — E785 Hyperlipidemia, unspecified: Secondary | ICD-10-CM

## 2018-01-03 ENCOUNTER — Encounter: Payer: Self-pay | Admitting: Family Medicine

## 2018-01-03 ENCOUNTER — Ambulatory Visit (INDEPENDENT_AMBULATORY_CARE_PROVIDER_SITE_OTHER): Payer: Medicare Other | Admitting: Family Medicine

## 2018-01-03 VITALS — BP 113/69 | HR 76 | Ht 75.0 in | Wt 242.0 lb

## 2018-01-03 DIAGNOSIS — E785 Hyperlipidemia, unspecified: Secondary | ICD-10-CM | POA: Diagnosis not present

## 2018-01-03 DIAGNOSIS — E119 Type 2 diabetes mellitus without complications: Secondary | ICD-10-CM | POA: Diagnosis not present

## 2018-01-03 DIAGNOSIS — E1142 Type 2 diabetes mellitus with diabetic polyneuropathy: Secondary | ICD-10-CM

## 2018-01-03 DIAGNOSIS — Z125 Encounter for screening for malignant neoplasm of prostate: Secondary | ICD-10-CM

## 2018-01-03 DIAGNOSIS — I10 Essential (primary) hypertension: Secondary | ICD-10-CM | POA: Diagnosis not present

## 2018-01-03 DIAGNOSIS — E1159 Type 2 diabetes mellitus with other circulatory complications: Secondary | ICD-10-CM

## 2018-01-03 DIAGNOSIS — E538 Deficiency of other specified B group vitamins: Secondary | ICD-10-CM | POA: Diagnosis not present

## 2018-01-03 DIAGNOSIS — E669 Obesity, unspecified: Secondary | ICD-10-CM

## 2018-01-03 DIAGNOSIS — E1169 Type 2 diabetes mellitus with other specified complication: Secondary | ICD-10-CM

## 2018-01-03 LAB — POCT GLYCOSYLATED HEMOGLOBIN (HGB A1C): HEMOGLOBIN A1C: 6.2 % — AB (ref 4.0–5.6)

## 2018-01-03 MED ORDER — CEPHALEXIN 500 MG PO CAPS: 500.0000 mg | ORAL_CAPSULE | Freq: Three times a day (TID) | ORAL | 0 refills | Status: DC

## 2018-01-03 NOTE — Progress Notes (Signed)
Luis Fernandez is a 72 y.o. male who presents to [name]: Primary Care Sports Medicine today for   Diabetes: Patient's exercise and diet routine is sustainable. He enjoys walking his dog and is continuing to monitor his carbs. He has no new symptoms. He denies any problems with his feet. He denies polyuria and polydipsia. He is tolerating his medications.   Sinus infection: Patient states he had a head cold 3 weeks ago and is now experiencing a sinus infection. He has a history of recurrent sinus infections and says this feels the same as prior episodes. He is experiencing nasal congestion, facial pain, and head aches. He notes adverse reactions to prednisone. He states previous sinus infections have been treated with 10 days of Cephalexin.  HTN: Doing well with Benazepril/HCTZ. No chest pain or lightheadedness.    ROS as above:  Exam:  BP 113/69   Pulse 76   Ht 6\' 3"  (1.905 m)   Wt 242 lb (109.8 kg)   BMI 30.25 kg/m  Gen: Well NAD HEENT: EOMI,  MMM, Frontal and maxillary sinuses tender to percussion. Normal posterior pharynx.  Lungs: Normal work of breathing. CTABL Heart: RRR no MRG Abd: NABS, Soft. Nondistended, Nontender Exts: Brisk capillary refill, warm and well perfused.   Lab and Radiology Results Results for orders placed or performed in visit on 01/03/18 (from the past 72 hour(s))  POCT HgB A1C     Status: Abnormal   Collection Time: 01/03/18  9:24 AM  Result Value Ref Range   Hemoglobin A1C 6.2 (A) 4.0 - 5.6 %   HbA1c, POC (prediabetic range)  5.7 - 6.4 %   HbA1c, POC (controlled diabetic range)  0.0 - 7.0 %   No results found.   Assessment and Plan: 72 y.o. male with   Diabetes: Patient's diabetes is well controlled and he is tolerating his medications. He should continue to exercise and monitor his carbohydrate intake. We will continue current medication regimen. He  should return for A1C recheck in 3 months.  Sinus infection: Patient has a long history of sinus infections and knows this process well.  He has previously recovered well with a 10 day course of Keflex, which we will start today. He has an adverse reaction to prednisone so we will avoid that for now. He should return if he does not get better in the next 10 days.   HTN: Doing well. Continue current treatment.   Check PSA, B12, Lipids and CBC, CMP for follow up chronic issues listed in visit diagnosis. Yearly labs ar due.  Orders Placed This Encounter  Procedures  . CBC  . COMPLETE METABOLIC PANEL WITH GFR  . Lipid Panel w/reflex Direct LDL  . Vitamin B12  . PSA  . POCT HgB A1C   Meds ordered this encounter  Medications  . DISCONTD: cephALEXin (KEFLEX) 500 MG capsule    Sig: Take 1 capsule (500 mg total) by mouth 3 (three) times daily.    Dispense:  21 capsule    Refill:  0  . cephALEXin (KEFLEX) 500 MG capsule    Sig: Take 1 capsule (500 mg total) by mouth 3 (three) times daily.    Dispense:  30 capsule    Refill:  0    Replaces 7 day prescription     Historical information moved to improve visibility of documentation.  Past Medical History:  Diagnosis Date  . Diabetes mellitus without complication (HCC)   . Hypertension   .  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 CARPAL TUNNEL RELEASE    . CATARACT EXTRACTION    . NOSE SURGERY    . TOTAL KNEE ARTHROPLASTY    . UMBILICAL HERNIA REPAIR     Social History   Tobacco Use  . Smoking status: Former Games developermoker  . Smokeless tobacco: Never Used  Substance Use Topics  . Alcohol use: Yes    Alcohol/week: 0.0 oz   family history includes Depression in his brother; Heart disease in his mother; Hypertension in his brother and mother.  Medications: Current Outpatient Medications  Medication Sig Dispense Refill  . azelastine (ASTELIN) 0.1  % nasal spray SPRAY ONE SPRAY IN THE BOTH NOSTRILS TWICE A DAY 30 mL 10  . BD PEN NEEDLE NANO U/F 32G X 4 MM MISC Inject into skin once daily. Diagnosis DM: E11.42 100 each PRN  . benazepril-hydrochlorthiazide (LOTENSIN HCT) 20-12.5 MG tablet Take 1 tablet by mouth daily. 90 tablet 3  . FARXIGA 10 MG TABS tablet TAKE ONE TABLET BY MOUTH DAILY 90 tablet 2  . fenofibrate (TRICOR) 145 MG tablet TAKE ONE TABLET BY MOUTH DAILY 90 tablet 3  . Glucosamine 500 MG CAPS Take by mouth.    Marland Kitchen. glucose blood (ONE TOUCH ULTRA TEST) test strip USE TO CHECK BLOOD SUGAR THREE TIMES A DAY 300 each PRN  . IBU 600 MG tablet TAKE ONE TABLET BY MOUTH EVERY 8 HOURS AS NEEDED 270 tablet 10  . LORazepam (ATIVAN) 0.5 MG tablet TAKE ONE TABLET BY MOUTH DAILY AS NEEDED 30 tablet 5  . MELATONIN PO Take by mouth.    . metFORMIN (GLUCOPHAGE-XR) 500 MG 24 hr tablet Take 2 tablets (1,000 mg total) by mouth 2 (two) times daily. 360 tablet 3  . methocarbamol (ROBAXIN) 500 MG tablet Take 1 tablet (500 mg total) by mouth at bedtime as needed for muscle spasms. 30 tablet 0  . Multiple Vitamins-Minerals (MULTIVITAMIN PO) Take by mouth.    . Omega-3 Fatty Acids (FISH OIL PO) Take by mouth.    [name]. ONETOUCH DELICA LANCETS 33G MISC USE TO CHECK BLOOD SUGAR THREE TIMES A DAY 300 each PRN  . pravastatin (PRAVACHOL) 80 MG tablet TAKE ONE TABLET BY MOUTH DAILY 90 tablet 0  . temazepam (RESTORIL) 30 MG capsule Take 1 capsule (30 mg total) by mouth at bedtime as needed. for sleep 30 capsule 3  . VICTOZA 18 MG/3ML SOPN DIAL AND INJECT SUBCUTANEOUSLY 1.8MG  DAILY 27 mL 2  . cephALEXin (KEFLEX) 500 MG capsule Take 1 capsule (500 mg total) by mouth 3 (three) times daily. 30 capsule 0   No current facility-administered medications for this visit.    No Known Allergies  Health Maintenance Health Maintenance  Topic Date Due  . OPHTHALMOLOGY EXAM  03/01/2018  . INFLUENZA VACCINE  03/03/2018  . HEMOGLOBIN A1C  07/05/2018  . FOOT EXAM  01/04/2019    . TETANUS/TDAP  03/05/2019  . Fecal DNA (Cologuard)  01/06/2020  . Hepatitis C Screening  Completed  . PNA vac Low Risk Adult  Completed    Discussed warning signs or symptoms. Please see discharge instructions. Patient expresses understanding.

## 2018-01-03 NOTE — Patient Instructions (Addendum)
Thank you for coming in today. Take keflex for Sinusitis.  Get fasting labs today.  Recheck with me in about 3 months.  Return sooner if needed.

## 2018-01-04 LAB — COMPLETE METABOLIC PANEL WITH GFR
AG Ratio: 2 (calc) (ref 1.0–2.5)
ALBUMIN MSPROF: 4.4 g/dL (ref 3.6–5.1)
ALT: 29 U/L (ref 9–46)
AST: 31 U/L (ref 10–35)
Alkaline phosphatase (APISO): 42 U/L (ref 40–115)
BILIRUBIN TOTAL: 0.5 mg/dL (ref 0.2–1.2)
BUN: 19 mg/dL (ref 7–25)
CHLORIDE: 105 mmol/L (ref 98–110)
CO2: 25 mmol/L (ref 20–32)
Calcium: 10 mg/dL (ref 8.6–10.3)
Creat: 0.86 mg/dL (ref 0.70–1.18)
GFR, Est African American: 100 mL/min/{1.73_m2} (ref >=60)
GFR, Est Non African American: 87 mL/min/{1.73_m2} (ref >=60)
GLUCOSE: 115 mg/dL — AB (ref 65–99)
Globulin: 2.2 g/dL (calc) (ref 1.9–3.7)
Potassium: 4.3 mmol/L (ref 3.5–5.3)
SODIUM: 140 mmol/L (ref 135–146)
Total Protein: 6.6 g/dL (ref 6.1–8.1)

## 2018-01-04 LAB — LIPID PANEL W/REFLEX DIRECT LDL
CHOLESTEROL: 143 mg/dL (ref <200)
HDL: 31 mg/dL — ABNORMAL LOW (ref >40)
LDL Cholesterol (Calc): 83 mg/dL (calc)
Non-HDL Cholesterol (Calc): 112 mg/dL (calc) (ref <130)
Total CHOL/HDL Ratio: 4.6 (calc) (ref <5.0)
Triglycerides: 192 mg/dL — ABNORMAL HIGH (ref <150)

## 2018-01-04 LAB — PSA: PSA: 1 ng/mL (ref <=4.0)

## 2018-01-04 LAB — CBC
HEMATOCRIT: 45.8 % (ref 38.5–50.0)
Hemoglobin: 15.9 g/dL (ref 13.2–17.1)
MCH: 32.3 pg (ref 27.0–33.0)
MCHC: 34.7 g/dL (ref 32.0–36.0)
MCV: 92.9 fL (ref 80.0–100.0)
MPV: 9.1 fL (ref 7.5–12.5)
Platelets: 287 10*3/uL (ref 140–400)
RBC: 4.93 10*6/uL (ref 4.20–5.80)
RDW: 12.3 % (ref 11.0–15.0)
WBC: 4.8 10*3/uL (ref 3.8–10.8)

## 2018-01-04 LAB — VITAMIN B12: Vitamin B-12: 556 pg/mL (ref 200–1100)

## 2018-01-26 ENCOUNTER — Other Ambulatory Visit: Payer: Self-pay | Admitting: Family Medicine

## 2018-02-09 ENCOUNTER — Other Ambulatory Visit: Payer: Self-pay | Admitting: Family Medicine

## 2018-03-04 ENCOUNTER — Other Ambulatory Visit: Payer: Self-pay

## 2018-03-15 LAB — HM DIABETES EYE EXAM

## 2018-03-16 ENCOUNTER — Telehealth: Payer: Self-pay | Admitting: Family Medicine

## 2018-03-16 NOTE — Telephone Encounter (Signed)
Diabetic eye exam report received from eye care center. \No diabetic retinopathy or macular edema.  Results will be abstracted.

## 2018-04-05 ENCOUNTER — Ambulatory Visit (INDEPENDENT_AMBULATORY_CARE_PROVIDER_SITE_OTHER): Payer: Medicare Other | Admitting: Family Medicine

## 2018-04-05 ENCOUNTER — Encounter: Payer: Self-pay | Admitting: Family Medicine

## 2018-04-05 VITALS — BP 123/70 | HR 77 | Temp 97.7°F | Wt 243.6 lb

## 2018-04-05 DIAGNOSIS — E1142 Type 2 diabetes mellitus with diabetic polyneuropathy: Secondary | ICD-10-CM | POA: Diagnosis not present

## 2018-04-05 DIAGNOSIS — I10 Essential (primary) hypertension: Secondary | ICD-10-CM

## 2018-04-05 DIAGNOSIS — Z23 Encounter for immunization: Secondary | ICD-10-CM

## 2018-04-05 DIAGNOSIS — M65332 Trigger finger, left middle finger: Secondary | ICD-10-CM | POA: Diagnosis not present

## 2018-04-05 LAB — POCT GLYCOSYLATED HEMOGLOBIN (HGB A1C): Hemoglobin A1C: 5.9 % — AB (ref 4.0–5.6)

## 2018-04-05 MED ORDER — IBUPROFEN 800 MG PO TABS: 800.0000 mg | ORAL_TABLET | Freq: Three times a day (TID) | ORAL | Status: DC | PRN

## 2018-04-05 NOTE — Progress Notes (Signed)
Luis Fernandez is a 72 y.o. male who presents to [name]: Primary Care Sports Medicine today for T2DM, HTN.   T2DM: His diabetes has been well controlled. He is doing well with his current regimen. He is consistently exercising and doing well with his diet. He says he has even lost some weight.   HTN: His blood pressure is well controlled. He denies light-headedness or dizziness   Additionally, he says that he has had some residual pain from his past trigger finger. He had an injection which provided some relief but he still has pain. He does not have any triggering. He has been taking 800 mg TID of ibuprofen which he says works very well. He is also doing some home exercises.    ROS as above:  Exam:  BP 123/70   Pulse 77   Temp 97.7 F (36.5 C) (Oral)   Wt 243 lb 9.6 oz (110.5 kg)   BMI 30.45 kg/m  Wt Readings from Last 5 Encounters:  04/05/18 243 lb 9.6 oz (110.5 kg)  01/03/18 242 lb (109.8 kg)  09/27/17 241 lb (109.3 kg)  09/02/17 245 lb (111.1 kg)  06/29/17 244 lb (110.7 kg)    Gen: Well NAD HEENT: EOMI,  MMM Lungs: Normal work of breathing. CTABL Heart: RRR no MRG Abd: NABS, Soft. Nondistended, Nontender Exts: Brisk capillary refill, warm and well perfused.  Left hand largely normal-appearing normal hand motion without triggering.  Lab and Radiology Results  Lab Results  Component Value Date   HGBA1C 5.9 (A) 04/05/2018      Assessment and Plan: 72 y.o. male with diabetes and hypertension. His diabetes is extremely well controlled with his HbA1c at 5.9%. The plan will be to continue his current regimen. His HTN is also very well controlled. He is doing very well overall and no changes are being made at this time. He was offered hand therapy for his trigger finger but says he would prefer to do exercises at home and take ibuprofen.   He got his high dose flu  vaccination today.    Orders Placed This Encounter  Procedures  . POCT HgB A1C   No orders of the defined types were placed in this encounter.    Historical information moved to improve visibility of documentation.  Past Medical History:  Diagnosis Date  . Diabetes mellitus without complication (HCC)   . Hypertension   . 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 CARPAL TUNNEL RELEASE    . CATARACT EXTRACTION    . NOSE SURGERY    . TOTAL KNEE ARTHROPLASTY    . UMBILICAL HERNIA REPAIR     Social History   Tobacco Use  . Smoking status: Former Games developer  . Smokeless tobacco: Never Used  Substance Use Topics  . Alcohol use: Yes    Alcohol/week: 0.0 standard drinks   family history includes Depression in his brother; Heart disease in his mother; Hypertension in his brother and mother.  Medications: Current Outpatient Medications  Medication Sig Dispense Refill  . azelastine (ASTELIN) 0.1 % nasal spray SPRAY ONE SPRAY IN THE BOTH NOSTRILS TWICE A DAY 30 mL 10  . BD PEN NEEDLE NANO U/F 32G X 4 MM MISC Inject into skin once daily. Diagnosis DM: E11.42 100 each PRN  . benazepril-hydrochlorthiazide (LOTENSIN HCT) 20-12.5 MG tablet Take 1 tablet by mouth daily. 90 tablet 3  .  FARXIGA 10 MG TABS tablet TAKE ONE TABLET BY MOUTH DAILY 90 tablet 2  . fenofibrate (TRICOR) 145 MG tablet TAKE ONE TABLET BY MOUTH DAILY 90 tablet 2  . Glucosamine 500 MG CAPS Take by mouth.    Marland Kitchen glucose blood (ONE TOUCH ULTRA TEST) test strip USE TO CHECK BLOOD SUGAR THREE TIMES A DAY 300 each PRN  . IBU 600 MG tablet TAKE ONE TABLET BY MOUTH EVERY 8 HOURS AS NEEDED 270 tablet 10  . LORazepam (ATIVAN) 0.5 MG tablet TAKE ONE TABLET BY MOUTH DAILY AS NEEDED 30 tablet 5  . MELATONIN PO Take by mouth.    . metFORMIN (GLUCOPHAGE-XR) 500 MG 24 hr tablet Take 2 tablets (1,000 mg total) by mouth 2 (two) times daily. 360 tablet  3  . methocarbamol (ROBAXIN) 500 MG tablet Take 1 tablet (500 mg total) by mouth at bedtime as needed for muscle spasms. 30 tablet 0  . Multiple Vitamins-Minerals (MULTIVITAMIN PO) Take by mouth.    . Omega-3 Fatty Acids (FISH OIL PO) Take by mouth.    [name] DELICA LANCETS 33G MISC USE TO CHECK BLOOD SUGAR THREE TIMES A DAY 300 each PRN  . pravastatin (PRAVACHOL) 80 MG tablet TAKE ONE TABLET BY MOUTH DAILY 90 tablet 0  . temazepam (RESTORIL) 30 MG capsule TAKE ONE CAPSULE BY MOUTH AT BEDTIME AS NEEDED FOR SLEEP 30 capsule 5  . VICTOZA 18 MG/3ML SOPN DIAL AND INJECT SUBCUTANEOUSLY 1.8MG  DAILY 27 mL 2   No current facility-administered medications for this visit.    No Known Allergies   Discussed warning signs or symptoms. Please see discharge instructions. Patient expresses understanding.   I personally was present and performed or re-performed the history, physical exam and medical decision-making activities of this service and have verified that the service and findings are accurately documented in the student's note. ___________________________________________ [name] M.D., ABFM., CAQSM. Primary Care and Sports Medicine Adjunct Instructor of Family Medicine  [employer]

## 2018-04-05 NOTE — Patient Instructions (Signed)
Thank you for coming in today. Recheck in 4 months.  Return sooner if needed.

## 2018-04-08 ENCOUNTER — Encounter: Payer: Self-pay | Admitting: Family Medicine

## 2018-04-15 ENCOUNTER — Telehealth: Payer: Self-pay

## 2018-04-15 MED ORDER — TEMAZEPAM 30 MG PO CAPS: 30.0000 mg | ORAL_CAPSULE | Freq: Every evening | ORAL | 5 refills | Status: DC | PRN

## 2018-04-15 NOTE — Telephone Encounter (Signed)
I sent a new prescription to the pharmacy with instructions asking them to refill it early as you are traveling out of town.

## 2018-04-15 NOTE — Telephone Encounter (Signed)
Patient called stated that he is going out of town and would like to have his Temazepam refilled early. Please advise. Naysha Sholl,CMA

## 2018-04-18 NOTE — Telephone Encounter (Signed)
SPOKE TO PATIENT GAVE HIM ADVISE AS NOTED BELOW. Rhonda Cunningham,CMA  

## 2018-05-13 ENCOUNTER — Other Ambulatory Visit: Payer: Self-pay | Admitting: Family Medicine

## 2018-05-15 ENCOUNTER — Encounter: Payer: Self-pay | Admitting: Family Medicine

## 2018-05-16 MED ORDER — LIRAGLUTIDE 18 MG/3ML ~~LOC~~ SOPN: PEN_INJECTOR | SUBCUTANEOUS | 3 refills | Status: DC

## 2018-05-17 ENCOUNTER — Encounter: Payer: Self-pay | Admitting: Family Medicine

## 2018-05-17 ENCOUNTER — Telehealth: Payer: Self-pay | Admitting: Family Medicine

## 2018-05-17 NOTE — Telephone Encounter (Signed)
Received a letter from insurance that insurance will only allow 15 capsules a month of the Restoril and even with a PA we would not be able to get a whole month approved. Insurance does not give me an option to appeal. This is a plan exclusion.

## 2018-05-17 NOTE — Telephone Encounter (Signed)
I have sent a message to the patient and he is aware and will use Goodrx.

## 2018-05-17 NOTE — Telephone Encounter (Signed)
That is frustrating.  However this medication is generic and not expensive.  Using a good Rx coupon a 30-day supply of this medication at [facility] should cost $10.35. You can download the good Rx application on your phone or go to the website and print a coupon.  Https://www.goodrx.com/[email or web address]_override=temazepam&form=capsule&dosage=30mg &quantity=30  Let me know if you need any help or have any questions or concerns.

## 2018-06-08 ENCOUNTER — Other Ambulatory Visit: Payer: Self-pay | Admitting: Family Medicine

## 2018-06-08 DIAGNOSIS — E785 Hyperlipidemia, unspecified: Secondary | ICD-10-CM

## 2018-06-11 ENCOUNTER — Other Ambulatory Visit: Payer: Self-pay | Admitting: Physician Assistant

## 2018-06-11 ENCOUNTER — Other Ambulatory Visit: Payer: Self-pay | Admitting: Family Medicine

## 2018-06-13 NOTE — Telephone Encounter (Signed)
To PCP

## 2018-07-10 ENCOUNTER — Other Ambulatory Visit: Payer: Self-pay | Admitting: Family Medicine

## 2018-07-18 ENCOUNTER — Other Ambulatory Visit: Payer: Self-pay | Admitting: Family Medicine

## 2018-08-05 ENCOUNTER — Encounter: Payer: Self-pay | Admitting: Family Medicine

## 2018-08-05 ENCOUNTER — Ambulatory Visit (INDEPENDENT_AMBULATORY_CARE_PROVIDER_SITE_OTHER): Payer: Medicare Other | Admitting: Family Medicine

## 2018-08-05 VITALS — BP 117/58 | HR 69 | Ht 75.0 in | Wt 234.0 lb

## 2018-08-05 DIAGNOSIS — E785 Hyperlipidemia, unspecified: Secondary | ICD-10-CM | POA: Diagnosis not present

## 2018-08-05 DIAGNOSIS — I10 Essential (primary) hypertension: Secondary | ICD-10-CM | POA: Diagnosis not present

## 2018-08-05 DIAGNOSIS — E1159 Type 2 diabetes mellitus with other circulatory complications: Secondary | ICD-10-CM

## 2018-08-05 DIAGNOSIS — Z789 Other specified health status: Secondary | ICD-10-CM

## 2018-08-05 DIAGNOSIS — E1142 Type 2 diabetes mellitus with diabetic polyneuropathy: Secondary | ICD-10-CM

## 2018-08-05 LAB — POCT HEMOGLOBIN: Hemoglobin: 5.9 g/dL — AB (ref 11–14.6)

## 2018-08-05 MED ORDER — BENAZEPRIL HCL 20 MG PO TABS: 20.0000 mg | ORAL_TABLET | Freq: Every day | ORAL | 3 refills | Status: DC

## 2018-08-05 NOTE — Patient Instructions (Addendum)
Thank you for coming in today. STOP Benazepril/HCTZ.  Start Benazepril by itself.  Continue lower calorie diet.   Recheck with me in 5 months.  Continue exercises      Get lab today.

## 2018-08-05 NOTE — Progress Notes (Signed)
Luis Fernandez is a 73 y.o. male who presents to [place]: Primary Care Sports Medicine today for  diabetes, hypertension, hyperlipidemia.   Diabetes: [name] takes medications listed below and notes that his blood sugars are typically well controlled.  He denies any hyper or hypoglycemic episodes.  Hypertension: Taking benazepril and hydrochlorothiazide 20/12.5.  No chest pain palpitations or shortness of breath.  He does note some occasional lightheadedness or dizziness.  He notes his blood pressures have been better controlled with weight loss.  Hyperlipidemia: Taking fenofibrate, and pravastatin.  LDL was 83 in June when last checked.  Patient tolerates medication well with no issues.  Overweight: [name] has worked on a reduced calorie diet and exercise and has managed to lose some weight.  He is happy with how things are going.  He is doing a bit of intermittent fasting.  Additionally he is wondering if he needs a MMR vaccine booster.  He thinks he had measles and [place] measles as a child but is not sure about mumps.   ROS as above:  Exam:  BP (!) 117/58   Pulse 69   Ht '6\' 3"'  (1.905 m)   Wt 234 lb (106.1 kg)   BMI 29.25 kg/m  Wt Readings from Last 5 Encounters:  08/05/18 234 lb (106.1 kg)  04/05/18 243 lb 9.6 oz (110.5 kg)  01/03/18 242 lb (109.8 kg)  09/27/17 241 lb (109.3 kg)  09/02/17 245 lb (111.1 kg)    Gen: Well NAD HEENT: EOMI,  MMM Lungs: Normal work of breathing. CTABL Heart: RRR no MRG Abd: NABS, Soft. Nondistended, Nontender Exts: Brisk capillary refill, warm and well perfused.   Lab and Radiology Results Results for orders placed or performed in visit on 08/05/18 (from the past 72 hour(s))  POCT hemoglobin     Status: Abnormal   Collection Time: 08/05/18  9:33 AM  Result Value Ref Range   Hemoglobin 5.9 (A) 11 - 14.6 g/dL   No results found.    Assessment  and Plan: 73 y.o. male with  Diabetes: Doing well continue current regimen.  Recheck with me in about 5 months.  Return for nurse Medicare wellness exam sooner.  Hypertension doing well.  Patient has well-controlled blood pressure but is a bit symptomatic with hypotensive symptoms.  Plan to stop the hydrochlorothiazide component and just continue benazepril by itself.  With further weight loss may continue to decrease benazepril.  Hyperlipidemia doing well.  Weight loss will help.  Overweight: Patient is no longer obese.  This is fantastic.  Continue exercise and healthy diet.  MMR titer: Plan to check MMR titer given uncertain history of vaccine status or immunity.  Patient will schedule for nurse Medicare wellness visit in the near future.  PDMP reviewed during this encounter. Orders Placed This Encounter  Procedures  . Measles/Mumps/Rubella Immunity  . POCT hemoglobin   Meds ordered this encounter  Medications  . benazepril (LOTENSIN) 20 MG tablet    Sig: Take 1 tablet (20 mg total) by mouth daily.    Dispense:  90 tablet    Refill:  3    Replaces Benazepril/HCTZ     Historical information moved to improve visibility of documentation.  Past Medical History:  Diagnosis Date  . Diabetes mellitus without complication ([place])   . Hypertension   . Steroid-induced adrenal suppression ([place]) 01/21/2016   On medical reccords    Past Surgical History:  Procedure Laterality Date  . CARPAL TUNNEL RELEASE    .  CATARACT EXTRACTION    . NOSE SURGERY    . TOTAL KNEE ARTHROPLASTY    . UMBILICAL HERNIA REPAIR     Social History   Tobacco Use  . Smoking status: Former Research scientist (life sciences)  . Smokeless tobacco: Never Used  Substance Use Topics  . Alcohol use: Yes    Alcohol/week: 0.0 standard drinks   family history includes Depression in his brother; Heart disease in his mother; Hypertension in his brother and mother.  Medications: Current Outpatient Medications  Medication Sig Dispense  Refill  . azelastine (ASTELIN) 0.1 % nasal spray SPRAY ONE SPRAY IN EACH NOSTRIL TWICE DAILY 30 mL 9  . BD PEN NEEDLE NANO U/F 32G X 4 MM MISC Inject into skin once daily. Diagnosis DM: E11.42 100 each PRN  . FARXIGA 10 MG TABS tablet TAKE ONE TABLET BY MOUTH DAILY 90 tablet 1  . fenofibrate (TRICOR) 145 MG tablet TAKE ONE TABLET BY MOUTH DAILY 90 tablet 2  . Glucosamine 500 MG CAPS Take by mouth.    Marland Kitchen glucose blood (ONE TOUCH ULTRA TEST) test strip USE TO CHECK BLOOD SUGAR THREE TIMES A DAY 300 each PRN  . ibuprofen (ADVIL,MOTRIN) 800 MG tablet Take 1 tablet (800 mg total) by mouth every 8 (eight) hours as needed.    . liraglutide (VICTOZA) 18 MG/3ML SOPN DIAL AND INJECT SUBCUTANEOUSLY 1.8MG DAILY 27 mL 3  . LORazepam (ATIVAN) 0.5 MG tablet TAKE ONE TABLET BY MOUTH DAILY AS NEEDED 30 tablet 5  . MELATONIN PO Take by mouth.    . metFORMIN (GLUCOPHAGE-XR) 500 MG 24 hr tablet TAKE TWO TABLETS BY MOUTH TWICE A DAY 360 tablet 2  . methocarbamol (ROBAXIN) 500 MG tablet Take 1 tablet (500 mg total) by mouth at bedtime as needed for muscle spasms. 30 tablet 0  . Multiple Vitamins-Minerals (MULTIVITAMIN PO) Take by mouth.    . Omega-3 Fatty Acids (FISH OIL PO) Take by mouth.    Glory Rosebush DELICA LANCETS 33L MISC USE TO CHECK BLOOD SUGAR THREE TIMES A DAY 300 each PRN  . pravastatin (PRAVACHOL) 80 MG tablet TAKE ONE TABLET BY MOUTH DAILY 90 tablet 0  . temazepam (RESTORIL) 30 MG capsule Take 1 capsule (30 mg total) by mouth at bedtime as needed. for sleep 30 capsule 5  . benazepril (LOTENSIN) 20 MG tablet Take 1 tablet (20 mg total) by mouth daily. 90 tablet 3   No current facility-administered medications for this visit.    No Known Allergies   Discussed warning signs or symptoms. Please see discharge instructions. Patient expresses understanding.

## 2018-08-08 LAB — MEASLES/MUMPS/RUBELLA IMMUNITY
Mumps IgG: 55 AU/mL
Rubella: 12.2 index
Rubeola IgG: >300 AU/mL

## 2018-09-06 ENCOUNTER — Other Ambulatory Visit: Payer: Self-pay | Admitting: Family Medicine

## 2018-09-06 DIAGNOSIS — E785 Hyperlipidemia, unspecified: Secondary | ICD-10-CM

## 2018-09-12 ENCOUNTER — Ambulatory Visit (INDEPENDENT_AMBULATORY_CARE_PROVIDER_SITE_OTHER): Payer: Medicare Other | Admitting: *Deleted

## 2018-09-12 VITALS — BP 119/64 | HR 75 | Ht 75.0 in | Wt 239.0 lb

## 2018-09-12 DIAGNOSIS — Z0000 Encounter for general adult medical examination without abnormal findings: Secondary | ICD-10-CM

## 2018-09-12 NOTE — Progress Notes (Signed)
Subjective:   Luis Fernandez is a 73 y.o. male who presents for Medicare Annual/Subsequent preventive examination.  Review of Systems:  No ROS.  Medicare Wellness Visit. Additional risk factors are reflected in the social history.  Cardiac Risk Factors include: advanced age (>6men, >73 women);diabetes mellitus;dyslipidemia;hypertension;male gender  Sleep patterns: Getting 7-8 hours of sleep a night. Wakes up occasionally to go to the bathroom. Wakes up feeling rested. Home Safety/Smoke Alarms: Feels safe in home. Smoke alarms in place.  Living environment; Lives with wife in a 1 story home. Stairs going into the house have handrails. Shower is a walk in shower and grab rails are in place. Seat Belt Safety/Bike Helmet: Wears seat belt.     Male:   CCS-   cologuard done in 2018- noprmal per patient  PSA- utd Lab Results  Component Value Date   PSA 1.0 01/03/2018   PSA 1.06 06/13/2015   PSA 1.06 03/07/2015        Objective:    Vitals: There were no vitals taken for this visit.  There is no height or weight on file to calculate BMI.  Advanced Directives 09/12/2018 09/30/2017 12/17/2015  Does Patient Have a Medical Advance Directive? Yes Yes Yes  Type of Estate agent of [place];Living will Healthcare Power of [place];Living will Healthcare Power of [place];Living will;Out of facility DNR (pink MOST or yellow form)  Does patient want to make changes to medical advance directive? Yes (MAU/Ambulatory/Procedural Areas - Information given) - -  Copy of Healthcare Power of Attorney in Chart? No - copy requested Yes No - copy requested    Tobacco Social History   Tobacco Use  Smoking Status Former Smoker  Smokeless Tobacco Never Used     Counseling given: Not Answered   Clinical Intake:  Pre-visit preparation completed: Yes  Pain : No/denies pain     Nutritional Risks: None Diabetes: Yes CBG done?: No(blood sugar 116 yesterday evening) Did  pt. bring in CBG monitor from home?: No  How often do you need to have someone help you when you read instructions, pamphlets, or other written materials from your doctor or pharmacy?: 1 - Never What is the last grade level you completed in school?: 19  Interpreter Needed?: No  Information entered by :: Carolin Sicks, LPN  Past Medical History:  Diagnosis Date  . Diabetes mellitus without complication (HCC)   . Hypertension   . 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 CARPAL TUNNEL RELEASE    . CATARACT EXTRACTION    . NOSE SURGERY    . TOTAL KNEE ARTHROPLASTY    . UMBILICAL HERNIA REPAIR     Family History  Problem Relation Age of Onset  . Heart disease Mother   . Hypertension Mother   . Hypertension Brother   . Depression Brother   . Parkinson's disease Father    Social History   Socioeconomic History  . Marital status: Married    Spouse name: Johnny Bridge  . Number of children: 1  . Years of education: 30  . Highest education level: Master's degree (e.g., MA, MS, MEng, MEd, MSW, MBA)  Occupational History  . Occupation: Clinical research associate    Comment: retired  Engineer, production  . Financial resource strain: Not hard at all  . Food insecurity:    Worry: Never true    Inability: Never true  . Transportation needs:    Medical: No  Non-medical: No  Tobacco Use  . Smoking status: Former Games developer  . Smokeless tobacco: Never Used  Substance and Sexual Activity  . Alcohol use: Yes    Alcohol/week: 1.0 standard drinks    Types: 1 Cans of beer per week    Comment: occasionally  . Drug use: No  . Sexual activity: Yes  Lifestyle  . Physical activity:    Days per week: 7 days    Minutes per session: 60 min  . Stress: Not at all  Relationships  . Social connections:    Talks on phone: More than three times a week    Gets together: Once a week    Attends religious service: Never    Active member  of club or organization: No    Attends meetings of clubs or organizations: Never    Relationship status: Married  Other Topics Concern  . Not on file  Social History Narrative   Walks his dog for 1 hour every day. Goes to the gym as well and strength training and aerobic exercise.. Caffeine use daily    Outpatient Encounter Medications as of 09/12/2018  Medication Sig  . azelastine (ASTELIN) 0.1 % nasal spray SPRAY ONE SPRAY IN EACH NOSTRIL TWICE DAILY  . BD PEN NEEDLE NANO U/F 32G X 4 MM MISC Inject into skin once daily. Diagnosis DM: E11.42  . benazepril (LOTENSIN) 20 MG tablet Take 1 tablet (20 mg total) by mouth daily.  Marland Kitchen FARXIGA 10 MG TABS tablet TAKE ONE TABLET BY MOUTH DAILY  . fenofibrate (TRICOR) 145 MG tablet TAKE ONE TABLET BY MOUTH DAILY  . Glucosamine 500 MG CAPS Take by mouth.  Marland Kitchen glucose blood (ONE TOUCH ULTRA TEST) test strip USE TO CHECK BLOOD SUGAR THREE TIMES A DAY  . ibuprofen (ADVIL,MOTRIN) 800 MG tablet Take 1 tablet (800 mg total) by mouth every 8 (eight) hours as needed.  . liraglutide (VICTOZA) 18 MG/3ML SOPN DIAL AND INJECT SUBCUTANEOUSLY 1.8MG  DAILY  . LORazepam (ATIVAN) 0.5 MG tablet TAKE ONE TABLET BY MOUTH DAILY AS NEEDED  . MELATONIN PO Take by mouth.  . metFORMIN (GLUCOPHAGE-XR) 500 MG 24 hr tablet TAKE TWO TABLETS BY MOUTH TWICE A DAY  . Multiple Vitamins-Minerals (MULTIVITAMIN PO) Take by mouth.  . Omega-3 Fatty Acids (FISH OIL PO) Take by mouth.  [name] DELICA LANCETS 33G MISC USE TO CHECK BLOOD SUGAR THREE TIMES A DAY  . pravastatin (PRAVACHOL) 80 MG tablet TAKE ONE TABLET BY MOUTH DAILY  . temazepam (RESTORIL) 30 MG capsule Take 1 capsule (30 mg total) by mouth at bedtime as needed. for sleep  . [DISCONTINUED] methocarbamol (ROBAXIN) 500 MG tablet Take 1 tablet (500 mg total) by mouth at bedtime as needed for muscle spasms.   No facility-administered encounter medications on file as of 09/12/2018.     Activities of Daily Living In your present  state of health, do you have any difficulty performing the following activities: 09/12/2018  Hearing? Y  Comment has earing aids in both ears  Vision? N  Difficulty concentrating or making decisions? N  Walking or climbing stairs? N  Dressing or bathing? N  Doing errands, shopping? N  Preparing Food and eating ? N  Using the Toilet? N  In the past six months, have you accidently leaked urine? N  Do you have problems with loss of bowel control? N  Managing your Medications? N  Managing your Finances? N  Housekeeping or managing your Housekeeping? N  Some recent data might be  hidden    Patient Care Team: Rodolph Bongorey, Evan S, MD as PCP - General (Family Medicine)   Assessment:   This is a routine wellness examination for [place].Physical assessment deferred to PCP. Pt checks blood sugars 3 times a day at home.   Exercise Activities and Dietary recommendations Current Exercise Habits: Home exercise routine, Type of exercise: walking;strength training/weights, Time (Minutes): 60, Frequency (Times/Week): 7, Weekly Exercise (Minutes/Week): 420, Intensity: Mild Diet Eating a healthy diet with vegetables, fruits, dairy and proteins. Drinks 3-4 quarts of water daily Breakfast: bagels and sausage, Cereal with bananas and blueberries Lunch: soup Dinner: egg casserole or will go out to eat.      Goals    . Weight (lb) < 200 lb (90.7 kg)     Patient reports he would like to loose 30 lbs       Fall Risk Fall Risk  09/12/2018 08/05/2018 03/04/2018 01/03/2018 12/29/2016  Falls in the past year? 1 0 No No No  Comment - - Emmi Telephone Survey: data to providers prior to load - -  Number falls in past yr: 0 0 - - -  Injury with Fall? 0 0 - - -  Follow up Falls prevention discussed - - - -   Is the patient's home free of loose throw rugs in walkways, pet beds, electrical cords, etc?   yes      Grab bars in the bathroom? yes      Handrails on the stairs?   yes      Adequate lighting?    yes   Depression Screen PHQ 2/9 Scores 09/12/2018 01/03/2018 05/26/2017 12/29/2016  PHQ - 2 Score 0 0 0 0    Cognitive Function     6CIT Screen 09/12/2018 12/29/2016  What Year? 0 points 0 points  What month? 0 points 0 points  What time? 0 points 0 points  Count back from 20 0 points 0 points  Months in reverse 0 points 0 points  Repeat phrase 2 points 4 points  Total Score 2 4    Immunization History  Administered Date(s) Administered  . Influenza, High Dose Seasonal PF 04/05/2018  . Influenza-Unspecified 05/12/2016, 05/12/2017  . Pneumococcal Conjugate-13 06/22/2014  . Pneumococcal Polysaccharide-23 02/20/2010  . Pneumococcal-Unspecified 02/20/2010  . Tdap 03/04/2009  . Zoster 01/01/2002  . Zoster Recombinat (Shingrix) 11/01/2016, 03/01/2017    Screening Tests Health Maintenance  Topic Date Due  . HEMOGLOBIN A1C  10/04/2018  . FOOT EXAM  01/04/2019  . TETANUS/TDAP  03/05/2019  . OPHTHALMOLOGY EXAM  03/16/2019  . Fecal DNA (Cologuard)  01/06/2020  . INFLUENZA VACCINE  Completed  . Hepatitis C Screening  Completed  . PNA vac Low Risk Adult  Completed        Plan:  Please schedule an office visit with yourt PCP.  Mr. Read DriversShearer , Thank you for taking time to come for your Medicare Wellness Visit. I appreciate your ongoing commitment to your health goals. Please review the following plan we discussed and let me know if I can assist you in the future.   Please schedule your next medicare wellness visit with me in 1 yr.  Bring a copy of your living will and/or healthcare power of attorney to your next office visit.   These are the goals we discussed: Goals    . Weight (lb) < 200 lb (90.7 kg)     Patient reports he would like to loose 30 lbs       This is a  list of the screening recommended for you and due dates:  Health Maintenance  Topic Date Due  . Hemoglobin A1C  10/04/2018  . Complete foot exam   01/04/2019  . Tetanus Vaccine  03/05/2019  . Eye exam for  diabetics  03/16/2019  . Cologuard (Stool DNA test)  01/06/2020  . Flu Shot  Completed  .  Hepatitis C: One time screening is recommended by Center for Disease Control  (CDC) for  adults born from [birth year] through 1966.   Completed  . Pneumonia vaccines  Completed     I have personally reviewed and noted the following in the patient's chart:   . Medical and social history . Use of alcohol, tobacco or illicit drugs  . Current medications and supplements . Functional ability and status . Nutritional status . Physical activity . Advanced directives . List of other physicians . Hospitalizations, surgeries, and ER visits in previous 12 months . Vitals . Screenings to include cognitive, depression, and falls . Referrals and appointments  In addition, I have reviewed and discussed with patient certain preventive protocols, quality metrics, and best practice recommendations. A written personalized care plan for preventive services as well as general preventive health recommendations were provided to patient.     Normand SloopKimberly A Ramani Riva, LPN  4/09/81192/05/2019

## 2018-09-12 NOTE — Patient Instructions (Signed)
Luis Fernandez , Thank you for taking time to come for your Medicare Wellness Visit. I appreciate your ongoing commitment to your health goals. Please review the following plan we discussed and let me know if I can assist you in the future.   Please schedule your next medicare wellness visit with me in 1 yr.  Bring a copy of your living will and/or healthcare power of attorney to your next office visit. These are the goals we discussed: Goals    . Weight (lb) < 200 lb (90.7 kg)     Patient reports he would like to loose 30 lbs

## 2018-10-04 ENCOUNTER — Other Ambulatory Visit: Payer: Self-pay | Admitting: Family Medicine

## 2018-10-18 ENCOUNTER — Other Ambulatory Visit: Payer: Self-pay | Admitting: Family Medicine

## 2018-11-06 ENCOUNTER — Other Ambulatory Visit: Payer: Self-pay | Admitting: Family Medicine

## 2018-11-17 ENCOUNTER — Other Ambulatory Visit: Payer: Self-pay | Admitting: Family Medicine

## 2018-11-17 DIAGNOSIS — E1142 Type 2 diabetes mellitus with diabetic polyneuropathy: Secondary | ICD-10-CM

## 2018-11-23 ENCOUNTER — Telehealth: Payer: Medicare Other | Admitting: Family

## 2018-11-23 DIAGNOSIS — J019 Acute sinusitis, unspecified: Secondary | ICD-10-CM | POA: Diagnosis not present

## 2018-11-23 DIAGNOSIS — B9689 Other specified bacterial agents as the cause of diseases classified elsewhere: Secondary | ICD-10-CM

## 2018-11-23 MED ORDER — AMOXICILLIN-POT CLAVULANATE 875-125 MG PO TABS: 1.0000 | ORAL_TABLET | Freq: Two times a day (BID) | ORAL | 0 refills | Status: DC

## 2018-11-23 NOTE — Progress Notes (Signed)
We are sorry that you are not feeling well.  Here is how we plan to help!  Based on what you have shared with me it looks like you have sinusitis.  Sinusitis is inflammation and infection in the sinus cavities of the head.   Based on your presentation I believe you most likely have Acute Bacterial Sinusitis.  This is an infection caused by bacteria and is treated with antibiotics. I have prescribed Augmentin 875mg /125mg  one tablet twice daily with food, for 7 days. This will cover an ear infection as well. A motion sickness medication over the counter may help with the vertigo.   You may use an oral decongestant such as Mucinex D or if you have glaucoma or high blood pressure use plain Mucinex. Saline nasal spray help and can safely be used as often as needed for congestion.  If you develop worsening sinus pain, fever or notice severe headache and vision changes, or if symptoms are not better after completion of antibiotic, please schedule an appointment with a health care provider.    Sinus infections are not as easily transmitted as other respiratory infection, however we still recommend that you avoid close contact with loved ones, especially the very young and elderly.  Remember to wash your hands thoroughly throughout the day as this is the number one way to prevent the spread of infection!  Home Care:  Only take medications as instructed by your medical team.  Complete the entire course of an antibiotic.  Do not take these medications with alcohol.  A steam or ultrasonic humidifier can help congestion.  You can place a towel over your head and breathe in the steam from hot water coming from a faucet.  Avoid close contacts especially the very young and the elderly.  Cover your mouth when you cough or sneeze.  Always remember to wash your hands.  Get Help Right Away If:  You develop worsening fever or sinus pain.  You develop a severe head ache or visual changes.  Your symptoms  persist after you have completed your treatment plan.  Make sure you  Understand these instructions.  Will watch your condition.  Will get help right away if you are not doing well or get worse.  Your e-visit answers were reviewed by a board certified advanced clinical practitioner to complete your personal care plan.  Depending on the condition, your plan could have included both over the counter or prescription medications.  If there is a problem please reply  once you have received a response from your provider.  Your safety is important to [place].  If you have drug allergies check your prescription carefully.    You can use MyChart to ask questions about today's visit, request a non-urgent call back, or ask for a work or school excuse for 24 hours related to this e-Visit. If it has been greater than 24 hours you will need to follow up with your provider, or enter a new e-Visit to address those concerns.  You will get an e-mail in the next two days asking about your experience.  I hope that your e-visit has been valuable and will speed your recovery. Thank you for using e-visits.  Greater than 5 minutes, yet less than 10 minutes of time have been spent researching, coordinating, and implementing care for this patient today.  Thank you for the details you included in the comment boxes. Those details are very helpful in determining the best course of treatment for you and  help us to provide the best care.

## 2018-11-29 ENCOUNTER — Other Ambulatory Visit: Payer: Self-pay | Admitting: Family Medicine

## 2018-11-29 ENCOUNTER — Telehealth: Payer: Medicare Other | Admitting: Nurse Practitioner

## 2018-11-29 DIAGNOSIS — B9689 Other specified bacterial agents as the cause of diseases classified elsewhere: Secondary | ICD-10-CM

## 2018-11-29 DIAGNOSIS — J019 Acute sinusitis, unspecified: Secondary | ICD-10-CM

## 2018-11-29 DIAGNOSIS — H9209 Otalgia, unspecified ear: Secondary | ICD-10-CM

## 2018-11-29 NOTE — Progress Notes (Signed)
Based on what you shared with me it looks like you have think you are in need of more antibiotics. You  should be evaluated in a face to face office visit. If the augmen tin you were rx did not help then you need to be seen to be evaluated.   NOTE: If you entered your credit card information for this eVisit, you will not be charged. You may see a "hold" on your card for the $30 but that hold will drop off and you will not have a charge processed.  If you are having a true medical emergency please call 911.  If you need an urgent face to face visit, [place] has four urgent care centers for your convenience.  If you need care fast and have a high deductible or no insurance consider:   [email or web address] to reserve your spot online an avoid wait times  [facility] [address], [address] [place], [place] [address] 8 am to 8 pm Monday-Friday 10 am to 4 pm Saturday-Sunday *Across the street from [employer]  [address] [place], [address] 8 am to 5 pm Monday-Friday * In the [facility] on the [facility]   The following sites will take your  insurance:  . [facility]  [phone] Get Driving Directions Find a Provider at this Location  [address] [place], [place] [address] . 10 am to 8 pm Monday-Friday . 12 pm to 8 pm Saturday-Sunday   . [facility] at [facility]  [phone] Get Driving Directions Find a Provider at this Location  [address], [address] [place], [place] [address] . 8 am to 8 pm Monday-Friday . 9 am to 6 pm Saturday . 11 am to 6 pm Sunday   . [facility] at [facility]  [phone] Get Driving Directions  [address] [place], [place] [address] . 8 am to 8 pm Monday-Friday . 8 am to 4 pm Saturday-Sunday   Your e-visit answers were reviewed by a board certified advanced clinical practitioner to  complete your personal care plan.  5-10 minutes spent reviewing and documenting in chart.

## 2018-11-30 ENCOUNTER — Encounter: Payer: Self-pay | Admitting: *Deleted

## 2018-11-30 ENCOUNTER — Emergency Department (INDEPENDENT_AMBULATORY_CARE_PROVIDER_SITE_OTHER)
Admission: EM | Admit: 2018-11-30 | Discharge: 2018-11-30 | Disposition: A | Discharge status: Home / Self Care | Payer: Medicare Other | Source: Home / Self Care | Attending: Family Medicine | Admitting: Family Medicine

## 2018-11-30 ENCOUNTER — Other Ambulatory Visit: Payer: Self-pay

## 2018-11-30 DIAGNOSIS — J069 Acute upper respiratory infection, unspecified: Secondary | ICD-10-CM

## 2018-11-30 DIAGNOSIS — J0101 Acute recurrent maxillary sinusitis: Secondary | ICD-10-CM | POA: Diagnosis not present

## 2018-11-30 DIAGNOSIS — H8110 Benign paroxysmal vertigo, unspecified ear: Secondary | ICD-10-CM | POA: Diagnosis not present

## 2018-11-30 MED ORDER — CEPHALEXIN 500 MG PO CAPS: ORAL_CAPSULE | ORAL | 0 refills | Status: DC

## 2018-11-30 NOTE — Discharge Instructions (Addendum)
Continue Pseudoephedrine (30mg , one or two every 4 to 6 hours) for sinus congestion.  Get adequate rest.  Stay well hydrated. May use Afrin nasal spray (or generic oxymetazoline) each morning for about 5 days and then discontinue.  Also recommend using saline nasal spray several times daily and saline nasal irrigation (AYR is a common brand).  Use Flonase nasal spray each morning after using Afrin nasal spray and saline nasal irrigation. Try warm salt water gargles for sore throat.  Stop all antihistamines for now, and other non-prescription cough/cold preparations.

## 2018-11-30 NOTE — Telephone Encounter (Signed)
Requesting RF on Lorazepam   Last RX sent 06/14/18 for #30 with 5 RF  Last OV 08-05-18 and was advised to follow up in 5 months Future appt scheduled for 01/04/19  RX pended, please review and send if appropriate

## 2018-11-30 NOTE — ED Triage Notes (Signed)
Pt c/o LT ear ache and vertigo x 1 wk.  He has completed his augmentin. Denies fever or travel.

## 2018-11-30 NOTE — ED Provider Notes (Signed)
[facility]    CSN: [id number] Arrival date & time: 11/30/18  0804     History   Chief Complaint Chief Complaint  Patient presents with  . Otalgia  . Dizziness    HPI Luis Fernandez is a 73 y.o. male.   Patient reports that he had a cold-like illness one week ago, with sore throat, soreness in his neck, fullness in his ears, and facial pressure.  He was prescribed Augmentin for sinusitis through an E-visit, which he has now completed.  He subsequently developed mild vertigo and tinnitus that persists.  He denies fevers, chills, and sweats.  He has a history of seasonal rhinitis.  He believes that he still has sinusitis and has always had a good response in the past to Keflex.  The history is provided by the patient.    Past Medical History:  Diagnosis Date  . Diabetes mellitus without complication (HCC)   . Hypertension   . 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 Lumbar strain 09/02/2017  . Paresthesia 06/23/2016  . OSA (obstructive sleep apnea) 06/23/2016  . B12 deficiency 01/21/2016  . 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 Hyperlipidemia LDL goal <70 12/17/2015  . Hypertension associated with diabetes (HCC) 12/17/2015  . Overweight (BMI 25.0-29.9) 12/17/2015  . 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 Insomnia 12/17/2015    Past Surgical History:  Procedure Laterality Date  . CARPAL TUNNEL RELEASE    . CATARACT EXTRACTION    . NOSE SURGERY    . TOTAL KNEE ARTHROPLASTY    . UMBILICAL HERNIA REPAIR         Home Medications    Prior to Admission medications   Medication Sig Start Date End Date Taking? Authorizing Provider  azelastine (ASTELIN) 0.1 % nasal spray SPRAY ONE SPRAY IN EACH NOSTRIL TWICE DAILY 07/18/18   Rodolph Bong, MD  BD PEN NEEDLE NANO U/F 32G X 4 MM MISC Inject into skin once daily. Diagnosis DM:  E11.42 10/15/16   Rodolph Bong, MD  benazepril (LOTENSIN) 20 MG tablet Take 1 tablet (20 mg total) by mouth daily. 08/05/18   Rodolph Bong, MD  cephALEXin [facility]) 500 MG capsule Take one cap PO every 8 hours 11/30/18   Lattie Haw, MD  FARXIGA 10 MG TABS tablet TAKE ONE TABLET BY MOUTH DAILY 06/13/18   Rodolph Bong, MD  fenofibrate (TRICOR) 145 MG tablet TAKE ONE TABLET BY MOUTH DAILY 10/18/18   Rodolph Bong, MD  Glucosamine 500 MG CAPS Take by mouth.    [provider]  glucose blood (ONE TOUCH ULTRA TEST) test strip USE TO CHECK FOR BLOOD SUGAR THREE TIMES A DAY 11/18/18   Rodolph Bong, MD  ibuprofen (ADVIL,MOTRIN) 600 MG tablet TAKE ONE TABLET BY MOUTH EVERY 8 HOURS AS NEEDED 11/07/18   Rodolph Bong, MD  ibuprofen (ADVIL,MOTRIN) 800 MG tablet Take 1 tablet (800 mg total) by mouth every 8 (eight) hours as needed. 04/05/18   Rodolph Bong, MD  liraglutide (VICTOZA) 18 MG/3ML SOPN DIAL AND INJECT SUBCUTANEOUSLY 1.8MG  DAILY 05/16/18   Rodolph Bong, MD  LORazepam (ATIVAN) 0.5 MG tablet TAKE ONE TABLET BY MOUTH DAILY AS NEEDED 11/30/18   Rodolph Bong, MD  MELATONIN PO Take by mouth.    [provider]  metFORMIN (GLUCOPHAGE-XR) 500 MG 24 hr tablet TAKE  TWO TABLETS BY MOUTH TWICE A DAY 07/11/18   Rodolph Bong, MD  Multiple Vitamins-Minerals (MULTIVITAMIN PO) Take by mouth.    [provider]  Omega-3 Fatty Acids (FISH OIL PO) Take by mouth.    [provider]  [place] DELICA LANCETS 33G MISC USE TO CHECK BLOOD SUGAR THREE TIMES A DAY 10/28/17   Rodolph Bong, MD  pravastatin (PRAVACHOL) 80 MG tablet TAKE ONE TABLET BY MOUTH DAILY 09/06/18   Rodolph Bong, MD  temazepam (RESTORIL) 30 MG capsule TAKE ONE CAPSULE BY MOUTH AT BEDTIME AS NEEDED FOR SLEEP 10/04/18   Rodolph Bong, MD    Family History Family History  Problem Relation Age of Onset  . Heart disease Mother   . Hypertension Mother   . Hypertension Brother   . Depression Brother   . Parkinson's disease  Father     Social History Social History   Tobacco Use  . Smoking status: Former Games developer  . Smokeless tobacco: Never Used  Substance Use Topics  . Alcohol use: Yes    Alcohol/week: 1.0 standard drinks    Types: 1 Cans of beer per week    Comment: occasionally  . Drug use: No     Allergies   Patient has no known allergies.   Review of Systems Review of Systems + sore throat, improved No cough No pleuritic pain No wheezing + nasal congestion + post-nasal drainage + sinus pain/pressure No itchy/red eyes ? Left Earache + dizziness No hemoptysis No SOB No fever/chills No nausea No vomiting No abdominal pain No diarrhea No urinary symptoms No skin rash + fatigue No myalgias No headache Used OTC meds without relief   Physical Exam Triage Vital Signs ED Triage Vitals [11/30/18 0828]  Enc Vitals Group     BP 134/77     Pulse Rate 79     Resp 18     Temp 98.4 F (36.9 C)     Temp Source Oral     SpO2 98 %     Weight 231 lb (104.8 kg)     Height  (1.905 m)     Head Circumference      Peak Flow      Pain Score 0     Pain Loc      Pain Edu?      Excl. in GC?    No data found.  Updated Vital Signs BP 134/77 (BP Location: Right Arm)   Pulse 79   Temp 98.4 F (36.9 C) (Oral)   Resp 18   Ht  (1.905 m)   Wt 104.8 kg   SpO2 98%   BMI 28.87 kg/m   Visual Acuity Right Eye Distance:   Left Eye Distance:   Bilateral Distance:    Right Eye Near:   Left Eye Near:    Bilateral Near:     Physical Exam Nursing notes and Vital Signs reviewed. Appearance:  Patient appears stated age, and in no acute distress Eyes:  Pupils are equal, round, and reactive to light and accomodation.  Extraocular movement is intact.  Conjunctivae are not inflamed.  Fundi benign.  Minimal nystagmus noted.  Ears:  Canals normal. Right canal partly occluded by cerumen and unable to adequately visualize right tympanic membrane.  Left tympanic membrane normal. Nose:   Congested turbinates.  Maxillary sinus tenderness is present.  Pharynx:  Normal Neck:  Supple.  Enlarged posterior/lateral nodes are palpated bilaterally, tender to palpation on the left.  Lungs:  Clear to auscultation.  Breath sounds are equal.  Moving air well. Heart:  Regular rate and rhythm without murmurs, rubs, or gallops.  Abdomen:  Nontender without masses or hepatosplenomegaly.  Bowel sounds are present.  No CVA or flank tenderness.  Extremities:  No edema.  Skin:  No rash present.    UC Treatments / Results  Labs (all labs ordered are listed, but only abnormal results are displayed) Labs Reviewed -  Tympanometry:  Right ear tympanogram normal; Left ear tympanogram normal   EKG None  Radiology No results found.  Procedures Procedures (including critical care time)  Medications Ordered in UC Medications - No data to display  Initial Impression / Assessment and Plan / UC Course  I have reviewed the triage vital signs and the nursing notes.  Pertinent labs & imaging results that were available during my care of the patient were reviewed by me and considered in my medical decision making (see chart for details).    Begin Keflex 500mg  TID for 10 days.  If not improved after 10 days, consider sinus x-rays. Followup with Family Doctor if not improved in 10 days.   Final Clinical Impressions(s) / UC Diagnoses   Final diagnoses:  Viral URI  Acute recurrent maxillary sinusitis  Benign paroxysmal positional vertigo, unspecified laterality     Discharge Instructions     Continue Pseudoephedrine (30mg , one or two every 4 to 6 hours) for sinus congestion.  Get adequate rest.  Stay well hydrated. May use Afrin nasal spray (or generic oxymetazoline) each morning for about 5 days and then discontinue.  Also recommend using saline nasal spray several times daily and saline nasal irrigation (AYR is a common brand).  Use Flonase nasal spray each morning after using Afrin nasal  spray and saline nasal irrigation. Try warm salt water gargles for sore throat.  Stop all antihistamines for now, and other non-prescription cough/cold preparations.       ED Prescriptions    Medication Sig Dispense Auth. Provider   cephALEXin (KEFLEX) 500 MG capsule Take one cap PO every 8 hours 30 capsule Lattie HawBeese, Keiran Gaffey A, MD         Lattie HawBeese, Nekayla Heider A, MD 12/06/18 1058

## 2018-12-05 ENCOUNTER — Other Ambulatory Visit: Payer: Self-pay

## 2018-12-05 ENCOUNTER — Ambulatory Visit (INDEPENDENT_AMBULATORY_CARE_PROVIDER_SITE_OTHER): Payer: Medicare Other | Admitting: Family Medicine

## 2018-12-05 ENCOUNTER — Other Ambulatory Visit: Payer: Self-pay | Admitting: Family Medicine

## 2018-12-05 ENCOUNTER — Ambulatory Visit: Payer: Medicare Other | Attending: Family Medicine | Admitting: Rehabilitative and Restorative Service Providers"

## 2018-12-05 ENCOUNTER — Encounter: Payer: Self-pay | Admitting: Family Medicine

## 2018-12-05 VITALS — BP 151/83 | HR 81 | Temp 98.1°F | Wt 231.0 lb

## 2018-12-05 DIAGNOSIS — H8112 Benign paroxysmal vertigo, left ear: Secondary | ICD-10-CM

## 2018-12-05 DIAGNOSIS — E1142 Type 2 diabetes mellitus with diabetic polyneuropathy: Secondary | ICD-10-CM

## 2018-12-05 DIAGNOSIS — R2689 Other abnormalities of gait and mobility: Secondary | ICD-10-CM

## 2018-12-05 DIAGNOSIS — R42 Dizziness and giddiness: Secondary | ICD-10-CM | POA: Diagnosis not present

## 2018-12-05 DIAGNOSIS — R2681 Unsteadiness on feet: Secondary | ICD-10-CM | POA: Diagnosis not present

## 2018-12-05 DIAGNOSIS — E785 Hyperlipidemia, unspecified: Secondary | ICD-10-CM

## 2018-12-05 DIAGNOSIS — I10 Essential (primary) hypertension: Secondary | ICD-10-CM

## 2018-12-05 LAB — POCT GLYCOSYLATED HEMOGLOBIN (HGB A1C): Hemoglobin A1C: 5.9 % — AB (ref 4.0–5.6)

## 2018-12-05 MED ORDER — MECLIZINE HCL 25 MG PO TABS: 25.0000 mg | ORAL_TABLET | Freq: Three times a day (TID) | ORAL | 3 refills | Status: DC | PRN

## 2018-12-05 NOTE — Patient Instructions (Addendum)
Thank you for coming in today. I think this is vertigo likely BPPV. Labyrinthitis is also a possibility.   You should hear about physical therapy. We will try locally. I do have several backup locations in mind.   I think the neurorehab location will be better suited.    Benign Positional Vertigo Vertigo is the feeling that you or your surroundings are moving when they are not. Benign positional vertigo is the most common form of vertigo. This is usually a harmless condition (benign). This condition is positional. This means that symptoms are triggered by certain movements and positions. This condition can be dangerous if it occurs while you are doing something that could cause harm to you or others. This includes activities such as driving or operating machinery. What are the causes? In many cases, the cause of this condition is not known. It may be caused by a disturbance in an area of the inner ear that helps your brain to sense movement and balance. This disturbance can be caused by:  Viral infection (labyrinthitis).  Head injury.  Repetitive motion, such as jumping, dancing, or running. What increases the risk? You are more likely to develop this condition if:  You are a woman.  You are 31 years of age or older. What are the signs or symptoms? Symptoms of this condition usually happen when you move your head or your eyes in different directions. Symptoms may start suddenly, and usually last for less than a minute. They include:  Loss of balance and falling.  Feeling like you are spinning or moving.  Feeling like your surroundings are spinning or moving.  Nausea and vomiting.  Blurred vision.  Dizziness.  Involuntary eye movement (nystagmus). Symptoms can be mild and cause only minor problems, or they can be severe and interfere with daily life. Episodes of benign positional vertigo may return (recur) over time. Symptoms may improve over time. How is this  diagnosed? This condition may be diagnosed based on:  Your medical history.  Physical exam of the head, neck, and ears.  Tests, such as: ? MRI. ? CT scan. ? Eye movement tests. Your health care provider may ask you to change positions quickly while he or she watches you for symptoms of benign positional vertigo, such as nystagmus. Eye movement may be tested with a variety of exams that are designed to evaluate or stimulate vertigo. ? An electroencephalogram (EEG). This records electrical activity in your brain. ? Hearing tests. You may be referred to a health care provider who specializes in ear, nose, and throat (ENT) problems (otolaryngologist) or a provider who specializes in disorders of the nervous system (neurologist). How is this treated?  This condition may be treated in a session in which your health care provider moves your head in specific positions to adjust your inner ear back to normal. Treatment for this condition may take several sessions. Surgery may be needed in severe cases, but this is rare. In some cases, benign positional vertigo may resolve on its own in 2-4 weeks. Follow these instructions at home: Safety  Move slowly. Avoid sudden body or head movements or certain positions, as told by your health care provider.  Avoid driving until your health care provider says it is safe for you to do so.  Avoid operating heavy machinery until your health care provider says it is safe for you to do so.  Avoid doing any tasks that would be dangerous to you or others if vertigo occurs.  If you  have trouble walking or keeping your balance, try using a cane for stability. If you feel dizzy or unstable, sit down right away.  Return to your normal activities as told by your health care provider. Ask your health care provider what activities are safe for you. General instructions  Take over-the-counter and prescription medicines only as told by your health care  provider.  Drink enough fluid to keep your urine pale yellow.  Keep all follow-up visits as told by your health care provider. This is important. Contact a health care provider if:  You have a fever.  Your condition gets worse or you develop new symptoms.  Your family or friends notice any behavioral changes.  You have nausea or vomiting that gets worse.  You have numbness or a "pins and needles" sensation. Get help right away if you:  Have difficulty speaking or moving.  Are always dizzy.  Faint.  Develop severe headaches.  Have weakness in your legs or arms.  Have changes in your hearing or vision.  Develop a stiff neck.  Develop sensitivity to light. Summary  Vertigo is the feeling that you or your surroundings are moving when they are not. Benign positional vertigo is the most common form of vertigo.  The cause of this condition is not known. It may be caused by a disturbance in an area of the inner ear that helps your brain to sense movement and balance.  Symptoms include loss of balance and falling, feeling that you or your surroundings are moving, nausea and vomiting, and blurred vision.  This condition can be diagnosed based on symptoms, physical exam, and other tests, such as MRI, CT scan, eye movement tests, and hearing tests.  Follow safety instructions as told by your health care provider. You will also be told when to contact your health care provider in case of problems. This information is not intended to replace advice given to you by your health care provider. Make sure you discuss any questions you have with your health care provider. Document Released: 04/27/2006 Document Revised: 12/29/2017 Document Reviewed: 12/29/2017 Elsevier Interactive Patient Education  2019 [employer].  How to Perform the Epley Maneuver The Epley maneuver is an exercise that relieves symptoms of vertigo. Vertigo is the feeling that you or your surroundings are moving when  they are not. When you feel vertigo, you may feel like the room is spinning and have trouble walking. Dizziness is a little different than vertigo. When you are dizzy, you may feel unsteady or light-headed. You can do this maneuver at home whenever you have symptoms of vertigo. You can do it up to 3 times a day until your symptoms go away. Even though the Epley maneuver may relieve your vertigo for a few weeks, it is possible that your symptoms will return. This maneuver relieves vertigo, but it does not relieve dizziness. What are the risks? If it is done correctly, the Epley maneuver is considered safe. Sometimes it can lead to dizziness or nausea that goes away after a short time. If you develop other symptoms, such as changes in vision, weakness, or numbness, stop doing the maneuver and call your health care provider. How to perform the Epley maneuver 1. Sit on the edge of a bed or table with your back straight and your legs extended or hanging over the edge of the bed or table. 2. Turn your head halfway toward the affected ear or side. 3. Lie backward quickly with your head turned until you are  lying flat on your back. You may want to position a pillow under your shoulders. 4. Hold this position for 30 seconds. You may experience an attack of vertigo. This is normal. 5. Turn your head to the opposite direction until your unaffected ear is facing the floor. 6. Hold this position for 30 seconds. You may experience an attack of vertigo. This is normal. Hold this position until the vertigo stops. 7. Turn your whole body to the same side as your head. Hold for another 30 seconds. 8. Sit back up. You can repeat this exercise up to 3 times a day. Follow these instructions at home:  After doing the Epley maneuver, you can return to your normal activities.  Ask your health care provider if there is anything you should do at home to prevent vertigo. He or she may recommend that you: ? Keep your head  raised (elevated) with two or more pillows while you sleep. ? Do not sleep on the side of your affected ear. ? Get up slowly from bed. ? Avoid sudden movements during the day. ? Avoid extreme head movement, like looking up or bending over. Contact a health care provider if:  Your vertigo gets worse.  You have other symptoms, including: ? Nausea. ? Vomiting. ? Headache. Get help right away if:  You have vision changes.  You have a severe or worsening headache or neck pain.  You cannot stop vomiting.  You have new numbness or weakness in any part of your body. Summary  Vertigo is the feeling that you or your surroundings are moving when they are not.  The Epley maneuver is an exercise that relieves symptoms of vertigo.  If the Epley maneuver is done correctly, it is considered safe. You can do it up to 3 times a day. This information is not intended to replace advice given to you by your health care provider. Make sure you discuss any questions you have with your health care provider. Document Released: 07/25/2013 Document Revised: 06/09/2016 Document Reviewed: 06/09/2016 Elsevier Interactive Patient Education  2019 [employer]

## 2018-12-05 NOTE — Progress Notes (Signed)
Luis Fernandez is a 73 y.o. male who presents to [name] today for vertigo.  Patient is a 2-week history of vertigo.  This is also associated with onset of sinus pain and pressure.  He was seen for an ED visit on April 22 where he was prescribed Augmentin and followed up in urgent care on April 29 and was prescribed Keflex.  He also has been using over-the-counter Dramamine which is helped some.  Additionally he was prescribed lorazepam for his severe vertigo which is makes him sleepy.  He notes his sinus pain and pressure is significantly improved but he continues to have very bothersome vertigo.  He notes vertigo is typically felt is an intense room spinning sensation.  His vertigo typically occurs when he changes position especially sitting up from a laying down position.  He notes his vertigo episodes last 30 to 60 seconds.  He has fallen several times because of his impaired balance with vertigo recently.  He is concerned he may have labyrinthitis.  He notes slight decreased hearing in notes no change in tinnitus.  Hypertension: Patient notes his takes blood pressure medications listed below.  He thinks his blood pressures been elevated today because of Sudafed.  No chest pain palpitations shortness of breath.  Diabetes: Taking medications listed below.  No polyuria or polydipsia.   ROS as above:  Exam:  BP (!) 151/83   Pulse 81   Temp 98.1 F (36.7 C) (Oral)   Wt 231 lb (104.8 kg)   BMI 28.87 kg/m  Wt Readings from Last 5 Encounters:  12/05/18 231 lb (104.8 kg)  11/30/18 231 lb (104.8 kg)  09/12/18 239 lb (108.4 kg)  08/05/18 234 lb (106.1 kg)  04/05/18 243 lb 9.6 oz (110.5 kg)    Gen: Well NAD HEENT: EOMI,  MMM ear canals normal-appearing bilaterally. Lungs: Normal work of breathing. CTABL Heart: RRR no MRG Abd: NABS, Soft. Nondistended, Nontender  Exts: Brisk capillary refill, warm and well perfused.  Neuro alert and oriented normal coordination.  Slight impaired balance. Significantly positive left-sided Dix-Hallpike test.  Patient had significant nystagmus and vertigo with left-sided Dix-Hallpike.  Lab and Radiology Results Results for orders placed or performed in visit on 12/05/18 (from the past 72 hour(s))  POCT HgB A1C     Status: Abnormal   Collection Time: 12/05/18 10:59 AM  Result Value Ref Range   Hemoglobin A1C 5.9 (A) 4.0 - 5.6 %   HbA1c POC (<> result, manual entry)     HbA1c, POC (prediabetic range)     HbA1c, POC (controlled diabetic range)     No results found.      Assessment and Plan: 73 y.o. male with vertigo.  Associated with episode of sinusitis.  Sinusitis is improving with antibiotics but vertigo still present.  Vertigo very likely BPPV based on significantly positive left-sided Dix-Hallpike test.  Labyrinth-itis is a possibility.  Mnire's disease is a very remote possibility.  Plan to treat with referral to vestibular therapy.  We will additionally try using meclizine instead of Dramamine.  Recheck if not improving.  Hypertension: Blood pressure elevated today.  Likely due to illness.  Watchful waiting home blood pressure log.  Diabetes: Doing well.  A1c controlled continue current regimen.  PDMP not reviewed this encounter. Orders Placed This Encounter  Procedures  . Ambulatory referral to Physical Therapy    Referral Priority:   Urgent    Referral Type:  Physical Medicine    Referral Reason:   Specialty Services Required    Requested Specialty:   Physical Therapy  . Ambulatory referral to Physical Therapy    Referral Priority:   Routine    Referral Type:   Physical Medicine    Referral Reason:   Specialty Services Required    Requested Specialty:   Physical Therapy    Number of Visits Requested:   1  . POCT HgB A1C   Meds ordered this encounter  Medications  . meclizine (ANTIVERT) 25 MG  tablet    Sig: Take 1 tablet (25 mg total) by mouth 3 (three) times daily as needed for dizziness or nausea.    Dispense:  30 tablet    Refill:  3     Historical information moved to improve visibility of documentation.  Past Medical History:  Diagnosis Date  . Diabetes mellitus without complication (HCC)   . Hypertension   . 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 CARPAL TUNNEL RELEASE    . CATARACT EXTRACTION    . NOSE SURGERY    . TOTAL KNEE ARTHROPLASTY    . UMBILICAL HERNIA REPAIR     Social History   Tobacco Use  . Smoking status: Former Games developermoker  . Smokeless tobacco: Never Used  Substance Use Topics  . Alcohol use: Yes    Alcohol/week: 1.0 standard drinks    Types: 1 Cans of beer per week    Comment: occasionally   family history includes Depression in his brother; Heart disease in his mother; Hypertension in his brother and mother; Parkinson's disease in his father.  Medications: Current Outpatient Medications  Medication Sig Dispense Refill  . BD PEN NEEDLE NANO U/F 32G X 4 MM MISC Inject into skin once daily. Diagnosis DM: E11.42 100 each PRN  . benazepril (LOTENSIN) 20 MG tablet Take 1 tablet (20 mg total) by mouth daily. 90 tablet 3  . cephALEXin (KEFLEX) 500 MG capsule Take one cap PO every 8 hours 30 capsule 0  . FARXIGA 10 MG TABS tablet TAKE ONE TABLET BY MOUTH DAILY 90 tablet 1  . fenofibrate (TRICOR) 145 MG tablet TAKE ONE TABLET BY MOUTH DAILY 90 tablet 1  . Glucosamine 500 MG CAPS Take by mouth.    Marland Kitchen. glucose blood (ONE TOUCH ULTRA TEST) test strip USE TO CHECK FOR BLOOD SUGAR THREE TIMES A DAY 100 each PRN  . ibuprofen (ADVIL,MOTRIN) 600 MG tablet TAKE ONE TABLET BY MOUTH EVERY 8 HOURS AS NEEDED 270 tablet 9  . ibuprofen (ADVIL,MOTRIN) 800 MG tablet Take 1 tablet (800 mg total) by mouth every 8 (eight) hours as needed.    . liraglutide (VICTOZA) 18 MG/3ML SOPN  DIAL AND INJECT SUBCUTANEOUSLY 1.8MG  DAILY 27 mL 3  . LORazepam (ATIVAN) 0.5 MG tablet TAKE ONE TABLET BY MOUTH DAILY AS NEEDED 30 tablet 4  . MELATONIN PO Take by mouth.    . metFORMIN (GLUCOPHAGE-XR) 500 MG 24 hr tablet TAKE TWO TABLETS BY MOUTH TWICE A DAY 360 tablet 2  . Multiple Vitamins-Minerals (MULTIVITAMIN PO) Take by mouth.    . Omega-3 Fatty Acids (FISH OIL PO) Take by mouth.    [name]. ONETOUCH DELICA LANCETS 33G MISC USE TO CHECK BLOOD SUGAR THREE TIMES A DAY 300 each PRN  . temazepam (RESTORIL) 30 MG capsule TAKE ONE CAPSULE BY MOUTH AT BEDTIME AS NEEDED FOR SLEEP 30 capsule 5  .  azelastine (ASTELIN) 0.1 % nasal spray SPRAY ONE SPRAY IN EACH NOSTRIL TWICE DAILY (Patient not taking: Reported on 12/05/2018) 30 mL 9  . meclizine (ANTIVERT) 25 MG tablet Take 1 tablet (25 mg total) by mouth 3 (three) times daily as needed for dizziness or nausea. 30 tablet 3  . pravastatin (PRAVACHOL) 80 MG tablet TAKE ONE TABLET BY MOUTH DAILY 90 tablet 0   No current facility-administered medications for this visit.    No Known Allergies   Discussed warning signs or symptoms. Please see discharge instructions. Patient expresses understanding.

## 2018-12-06 ENCOUNTER — Encounter: Payer: Self-pay | Admitting: Rehabilitative and Restorative Service Providers"

## 2018-12-06 NOTE — Therapy (Signed)
[facility] [address] [place], [place], [address] Phone: [phone]   Fax:  [phone]  Physical Therapy Evaluation  Patient Details  Name: Luis Fernandez MRN: [id number] Date of Birth: 09-04-1945 Referring Provider (PT): [name], MD   Encounter Date: 12/05/2018  PT End of Session - 12/06/18 1213    Visit Number  1    Number of Visits  4    Date for PT Re-Evaluation  01/05/19    Authorization Type  medicare/ 10th visit progress note    PT Start Time  1350    PT Stop Time  1430    PT Time Calculation (min)  40 min       Past Medical History:  Diagnosis Date  . Diabetes mellitus without complication (HCC)   . Hypertension   . 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 CARPAL TUNNEL RELEASE    . CATARACT EXTRACTION    . NOSE SURGERY    . TOTAL KNEE ARTHROPLASTY    . UMBILICAL HERNIA REPAIR      There were no vitals filed for this visit.   Subjective Assessment - 12/05/18 1456    Subjective  Woke up on 11/23/2018 and had room spinning vertigo that lasts for seconds up to a minute when he gets up.  He has been seen by telehealth visit, urgent care, and then today at [facility] care.  He is taking meclizine (got presciption today) and took it once today at noon.  He has a h/o vertigo after an ear/sinus infection that was 20+ years ago.    He experienced 2 falls since vertigo began (one outdoors and one indoors).    Pertinent History  diabetes, HTN    Patient Stated Goals  Get rid of the dizziness.      Currently in Pain?  Yes   soreness from fall and pressure in his temples and frontal region      CLINIC OPERATION CHANGES: [facility] is operating at a low capacity due to COVID-19.  The patient was brought into the clinic for evaluation and/or treatment following universal masking by staff,  social distancing, and <10 people in the clinic.  The patient's COVID risk of complications score is 6 (moderate risk).  Due to acuity of vertigo and recent falls, felt the benefit of bringing into the clinic outweighed the risk.    [facility] PT Assessment - 12/05/18 1453      Assessment   Medical Diagnosis  BPPV    Referring Provider (PT)  [name], MD    Onset Date/Surgical Date  11/23/18    Prior Therapy  none      Precautions   Precautions  Fall      Restrictions   Weight Bearing Restrictions  No      Balance Screen   Has the patient fallen in the past 6 months  Yes    How many times?  2; since onset of vertigo    Has the patient had a decrease in activity level because of a fear of falling?   Yes    Is the patient reluctant to leave their home because of a fear of falling?   No      Home Nurse, mental health  Private residence    Home Access  Stairs to enter    Entrance Stairs-Number of Steps  2  Prior Function   Level of Independence  Independent    Vocation  Retired           Vestibular Assessment - 12/05/18 1500      Vestibular Assessment   General Observation  No baseline dizziness at rest; feels lightheadedness.      Symptom Behavior   Subjective history of current problem  Had an episode of dizziness 20+ years ago associated with sinusitis    Type of Dizziness   Spinning    Frequency of Dizziness  daily    Duration of Dizziness  seconds up to a minute    Symptom Nature  Positional    Aggravating Factors  Rolling to left    Relieving Factors  Head stationary      Oculomotor Exam   Oculomotor Alignment  Normal    Ocular ROM  WFLs    Spontaneous  Absent    Gaze-induced   Absent    Smooth Pursuits  Intact      Vestibulo-Ocular Reflex   VOR 1 Head Only (x 1 viewing)  slow pace provokes dizziness      Positional Testing   Dix-Hallpike  Dix-Hallpike Right;Dix-Hallpike Left    Horizontal Canal Testing  Horizontal Canal Right;Horizontal  Canal Left   did not initially do b/c of severity with L dix hallpike     Dix-Hallpike Right   Dix-Hallpike Right Duration  none    Dix-Hallpike Right Symptoms  No nystagmus      Dix-Hallpike Left   Dix-Hallpike Left Duration  25 seconds    Dix-Hallpike Left Symptoms  No nystagmus      Horizontal Canal Left   Horizontal Canal Left Duration  *suspected multi-canal due to imbalance and difficulty with head turns in horiozntal plane, but proceeded to treat L epley's immediately from L dix hallpike position b/c of  severity of symptoms.          Objective measurements completed on examination: See above findings.       Vestibular Treatment/Exercise - 12/06/18 1218      Vestibular Treatment/Exercise   Vestibular Treatment Provided  Canalith Repositioning    Canalith Repositioning  Epley Manuever Left;Canal Roll Left       EPLEY MANUEVER LEFT   Number of Reps   1    Overall Response   Symptoms Worsened     RESPONSE DETAILS LEFT  *Due to severity of symptoms with L dix hallpike (large amplitude, severe vertigo x 25 seconds) continued into treatment for L epley's without assessing horizontal canals.    After 1st rep epley's brought patient into L dix hallpike position and noted a geotropic horizontal nystagmus.  Therefore, treated with L horiozntal roll.      Canal Roll Left   Number of Reps   1    Response Details   Patient experienced nausea and vomitting after L horizontal canolith repositioning.  PT accompanied patient to care to ensure steady and safe with walking.            PT Education - 12/06/18 1212    Education Details  nature of BPPV; home safety today due to not feeling well after treatment (rest, move slowly, take a few seconds after rising to walk).    Person(s) Educated  Patient    Methods  Explanation    Comprehension  Verbalized understanding          PT Long Term Goals - 12/06/18 1213      PT LONG TERM GOAL #1  Title  The patient will be indep  with HEP for self mgmt of BPPV as indicated.    Time  4    Period  Weeks    Target Date  01/05/19      PT LONG TERM GOAL #2   Title  The patient will have negative positional testing to indicate resolution of BPPV.    Time  4    Period  Weeks    Target Date  01/05/19      PT LONG TERM GOAL #3   Title  The patient will report no vertigo during daily tasks.    Time  4    Period  Weeks    Target Date  01/05/19      PT LONG TERM GOAL #4   Title  Further balance assessment once vertigo cleared and HEP to address deficits if indicated.    Time  4    Period  Weeks    Target Date  01/05/19             Plan - 12/06/18 1214    Clinical Impression Statement  The patient is a 73 yo male presenting to OP physical therapy with severe L posterior canalithiasis, L horizontal canalithiasis and nausea/vomitting due to vertigo.  The patient has also experienced falls and imbalance related to symptoms.  PT to bring back into clinic in 2 days to check on patient and retreat vertigo as positional testing indicates.  I do not think we cleared both canals today.     Personal Factors and Comorbidities  Age;Comorbidity 1;Comorbidity 2;Other    Comorbidities  diabetes, hypertension, falls    Examination-Activity Limitations  Bed Mobility;Bend;Locomotion Level    Stability/Clinical Decision Making  Evolving/Moderate complexity    Clinical Decision Making  Moderate    Rehab Potential  Good    PT Frequency  1x / week   + eval   PT Duration  4 weeks    PT Treatment/Interventions  ADLs/Self Care Home Management;Canalith Repostioning;Therapeutic activities;Therapeutic exercise;Neuromuscular re-education;Balance training;Gait training;Functional mobility training;Patient/family education;Vestibular    PT Next Visit Plan  check BPPV, let patient rest in between repetitions, ? consider taking mask off due to his n/v and allowing air duirng maneuvers?    Consulted and Agree with Plan of Care  Patient        Patient will benefit from skilled therapeutic intervention in order to improve the following deficits and impairments:  Abnormal gait, Dizziness, Decreased balance  Visit Diagnosis: Other abnormalities of gait and mobility  Unsteadiness on feet  BPPV (benign paroxysmal positional vertigo), left     Problem List Patient Active Problem List   Diagnosis Date Noted  . Lumbar strain 09/02/2017  . Paresthesia 06/23/2016  . OSA (obstructive sleep apnea) 06/23/2016  . B12 deficiency 01/21/2016  . 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 Hyperlipidemia LDL goal <70 12/17/2015  . Hypertension associated with diabetes (HCC) 12/17/2015  . Overweight (BMI 25.0-29.9) 12/17/2015  . 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 Insomnia 12/17/2015    Jillian Warth, PT 12/06/2018, 12:31 PM  [place] [facility] [address] [place], [place], [address] Phone: [phone]   Fax:  [phone]  Name: Luis Fernandez MRN: [id number] Date of Birth: May 22, 1946

## 2018-12-07 ENCOUNTER — Ambulatory Visit: Payer: Medicare Other | Admitting: Rehabilitative and Restorative Service Providers"

## 2018-12-07 ENCOUNTER — Other Ambulatory Visit: Payer: Self-pay

## 2018-12-07 DIAGNOSIS — R2689 Other abnormalities of gait and mobility: Secondary | ICD-10-CM

## 2018-12-07 DIAGNOSIS — R2681 Unsteadiness on feet: Secondary | ICD-10-CM

## 2018-12-07 DIAGNOSIS — H8112 Benign paroxysmal vertigo, left ear: Secondary | ICD-10-CM | POA: Diagnosis not present

## 2018-12-07 NOTE — Therapy (Signed)
[facility] [address] [place], [place], [address] Phone: [phone]   Fax:  [phone]  Physical Therapy Treatment  Patient Details  Name: Luis Fernandez MRN: [id number] Date of Birth: 09/18/1945 Referring Provider (PT): [name], MD   Encounter Date: 12/07/2018  PT End of Session - 12/07/18 1222    Visit Number  2    Number of Visits  4    Date for PT Re-Evaluation  01/05/19    Authorization Type  medicare/ 10th visit progress note    PT Start Time  1146    PT Stop Time  1216    PT Time Calculation (min)  30 min    Activity Tolerance  Patient tolerated treatment well    Behavior During Therapy  WFL for tasks assessed/performed       Past Medical History:  Diagnosis Date  . Diabetes mellitus without complication (HCC)   . Hypertension   . 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 CARPAL TUNNEL RELEASE    . CATARACT EXTRACTION    . NOSE SURGERY    . TOTAL KNEE ARTHROPLASTY    . UMBILICAL HERNIA REPAIR      There were no vitals filed for this visit.  Subjective Assessment - 12/07/18 1147    Subjective  Since last visit he has improved significantly.  He is not noticing a true dizziness/vertigo any more, but is getting occasional lightheadedness that he associates with having a sinus infection.  He feels like allergies, sinuses are the cause of the problem.   He did have ear pain in the past couple of weeks, but that has resolved.    His balance feels improved since Monday.  He took Meclizine this morning due to n/v at last session.    Pertinent History  diabetes, HTN    Patient Stated Goals  Get rid of the dizziness.      Currently in Pain?  No/denies        CLINIC OPERATION CHANGES: [facility] is operating at a low capacity due to COVID-19.  The patient was brought into the  clinic for evaluation and/or treatment following universal masking by staff, social distancing, and <10 people in the clinic.  The patient's COVID risk of complications score is 6.       Vestibular Assessment - 12/07/18 1151      Vestibular Assessment   General Observation  The patient walks into clinic with wide base of support.      Positional Testing   Dix-Hallpike  Dix-Hallpike Right;Dix-Hallpike Left    Horizontal Canal Testing  Horizontal Canal Right;Horizontal Canal Left      Dix-Hallpike Right   Dix-Hallpike Right Duration  none    Dix-Hallpike Right Symptoms  No nystagmus      Dix-Hallpike Left   Dix-Hallpike Left Duration  none    Dix-Hallpike Left Symptoms  No nystagmus      Horizontal Canal Right   Horizontal Canal Right Duration  none    Horizontal Canal Right Symptoms  Normal      Horizontal Canal Left   Horizontal Canal Left Duration  none    Horizontal Canal Left Symptoms  Normal               [facility] Adult PT Treatment/Exercise - 12/07/18 1201      Neuro Re-ed    Neuro Re-ed Details     Feet together + eyes closed, feet apart on foam with eyes open + head turns horizontal x 10 and vertical x 10, foam with feet apart + eyes closed.      Vestibular Treatment/Exercise - 12/07/18 1223      Vestibular Treatment/Exercise   Vestibular Treatment Provided  Gaze    Gaze Exercises  X1 Viewing Horizontal      X1 Viewing Horizontal   Foot Position  seated then progressed to standing    Comments  30 seconds with cues on maintaining visual fixation                 PT Long Term Goals - 12/07/18 1222      PT LONG TERM GOAL #1   Title  The patient will be indep with HEP for self mgmt of BPPV as indicated.    Time  4    Period  Weeks      PT LONG TERM GOAL #2   Title  The patient will have negative positional testing to indicate resolution of BPPV.    Baseline  Met on 12/07/2018    Time  4    Period  Weeks    Status  Achieved      PT LONG  TERM GOAL #3   Title  The patient will report no vertigo during daily tasks.    Time  4    Period  Weeks      PT LONG TERM GOAL #4   Title  Further balance assessment once vertigo cleared and HEP to address deficits if indicated.    Time  4    Period  Weeks            Plan - 12/07/18 1223    Clinical Impression Statement  The patient was negative today for positional vertigo.  He continues with lightheadedness at times.  He has difficulty performing gaze adaptation exercises.  PT added for HEP and educated in balance HEP as eyes closed with narrow base of support leads to increased sway.    PT Treatment/Interventions  ADLs/Self Care Home Management;Canalith Repostioning;Therapeutic activities;Therapeutic exercise;Neuromuscular re-education;Balance training;Gait training;Functional mobility training;Patient/family education;Vestibular    PT Next Visit Plan  Check HEP for balance, check gaze x 1 viewing, reassess for BPPV if indicated    Consulted and Agree with Plan of Care  Patient       Patient will benefit from skilled therapeutic intervention in order to improve the following deficits and impairments:  Abnormal gait, Dizziness, Decreased balance  Visit Diagnosis: Other abnormalities of gait and mobility  Unsteadiness on feet  BPPV (benign paroxysmal positional vertigo), left     Problem List Patient Active Problem List   Diagnosis Date Noted  . Lumbar strain 09/02/2017  . Paresthesia 06/23/2016  . OSA (obstructive sleep apnea) 06/23/2016  . B12 deficiency 01/21/2016  . 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 Hyperlipidemia LDL goal <70 12/17/2015  . Hypertension associated with diabetes (HCC) 12/17/2015  . Overweight (BMI 25.0-29.9) 12/17/2015  . 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 Insomnia 12/17/2015    WEAVER,CHRISTINA, PT 12/07/2018, 12:24 PM  [facility] [address] [place], [place], [address] Phone: [phone]   Fax:  [phone]  Name: Everard Mcdougle MRN: [id number] Date of Birth: 07/24/1946   

## 2018-12-07 NOTE — Patient Instructions (Signed)
Access Code: [email or web address]  URL: [email or web address]  Date: 12/07/2018  Prepared by: Margretta Ditty   Exercises Standing Gaze Stabilization with Head Rotation - 1 reps - 1 sets - 30 seconds- 60 seconds hold - 2-3x daily - 7x weekly Romberg Stance with Eyes Closed - 3 reps - 1 sets - 30 seconds hold - 1-2x daily - 7x weekly Standing on Foam Pad - 10 reps - 3 sets - 1x daily - 7x weekly

## 2018-12-10 ENCOUNTER — Other Ambulatory Visit: Payer: Self-pay | Admitting: Family Medicine

## 2018-12-16 ENCOUNTER — Ambulatory Visit: Payer: Medicare Other | Admitting: Physical Therapy

## 2018-12-16 ENCOUNTER — Other Ambulatory Visit: Payer: Self-pay

## 2018-12-16 ENCOUNTER — Encounter: Payer: Self-pay | Admitting: Physical Therapy

## 2018-12-16 DIAGNOSIS — R2681 Unsteadiness on feet: Secondary | ICD-10-CM

## 2018-12-16 DIAGNOSIS — R2689 Other abnormalities of gait and mobility: Secondary | ICD-10-CM

## 2018-12-16 DIAGNOSIS — H8112 Benign paroxysmal vertigo, left ear: Secondary | ICD-10-CM | POA: Diagnosis not present

## 2018-12-16 NOTE — Therapy (Signed)
[facility] [address] [place], [place], [address] Phone: [phone]   Fax:  [phone]  Physical Therapy Treatment  Patient Details  Name: Luis Fernandez MRN: [id number] Date of Birth: 09/23/1945 Referring Provider (Luis Fernandez): Lynne Leader, MD   Encounter Date: 12/16/2018   CLINIC OPERATION CHANGES: [facility] is operating at a low capacity due to COVID-19.  The patient was brought into the clinic for evaluation and/or treatment following universal masking by staff, social distancing, and <10 people in the clinic.  The patient's COVID risk of complications score is 6.   Luis Fernandez End of Session - 12/16/18 1610    Visit Number  3    Number of Visits  4    Date for Luis Fernandez Re-Evaluation  01/05/19    Authorization Type  medicare/ 10th visit progress note    Luis Fernandez Start Time  1315    Luis Fernandez Stop Time  1355    Luis Fernandez Time Calculation (min)  40 min    Activity Tolerance  Patient tolerated treatment well    Behavior During Therapy  WFL for tasks assessed/performed       Past Medical History:  Diagnosis Date  . Diabetes mellitus without complication ([place])   . Hypertension   . Steroid-induced adrenal suppression ([place]) 01/21/2016   On medical reccords     Past Surgical History:  Procedure Laterality Date  . CARPAL TUNNEL RELEASE    . CATARACT EXTRACTION    . NOSE SURGERY    . TOTAL KNEE ARTHROPLASTY    . UMBILICAL HERNIA REPAIR      There were no vitals filed for this visit.  Subjective Assessment - 12/16/18 1320    Subjective  Was feeling much better and not having any symptoms until a day or two ago.  Experiences symptoms first thing in the morning when lying supine.  Less symptomatic than first episode but is noticing more symptoms to the R now.  Exercises are going well.    Pertinent History  diabetes, HTN    Patient Stated Goals  Get rid of the dizziness.      Currently in Pain?  No/denies              Vestibular Assessment - 12/16/18 1323      Vestibular Assessment   General Observation  Still unsteady with gait      Positional Testing   Dix-Hallpike  Dix-Hallpike Right;Dix-Hallpike Left    Sidelying Test  Sidelying Right;Sidelying Left    Horizontal Canal Testing  Horizontal Canal Right;Horizontal Canal Left      Dix-Hallpike Right   Dix-Hallpike Right Duration  0    Dix-Hallpike Right Symptoms  No nystagmus      Dix-Hallpike Left   Dix-Hallpike Left Duration  0    Dix-Hallpike Left Symptoms  No nystagmus      Horizontal Canal Right   Horizontal Canal Right Duration  0    Horizontal Canal Right Symptoms  Normal      Horizontal Canal Left   Horizontal Canal Left Duration  0    Horizontal Canal Left Symptoms  Normal      Also performed deep head hanging test to assess anterior canal with no nystagmus or vertigo reported.         [facility] Adult Luis Fernandez Treatment/Exercise - 12/16/18 1400      Exercises   Exercises  Knee/Hip      Knee/Hip Exercises: Stretches   Quad Stretch  Right;Left;1 rep;30 seconds  Quad Stretch Limitations  in sidelying with belt to assess if Luis Fernandez would have same symptoms of dizziness.  At home when performing quad stretch in prone Luis Fernandez has been having dizziness during exercise in prone and when transitioning from prone > supine.  No dizziness today performing in sidelying.             Luis Fernandez Education - 12/16/18 1401    Education Details  how to perform hip flexor and quad stretch in sidelying instead of prone; motion sensitivity to specific movements and possible need for habituation; purpose of meclizine PRN and need to start challenging vestibular system instead of suppressing it.    Person(s) Educated  Patient    Methods  Explanation;Demonstration;Handout    Comprehension  Verbalized understanding;Returned demonstration          Luis Fernandez Long Term Goals - 12/16/18 1614      Luis Fernandez LONG TERM GOAL #1   Title  The patient will be  indep with HEP for self mgmt of BPPV as indicated.    Time  4    Period  Weeks    Status  On-going    Target Date  01/05/19      Luis Fernandez LONG TERM GOAL #2   Title  The patient will have negative positional testing to indicate resolution of BPPV.    Baseline  Met on 12/07/2018    Time  4    Period  Weeks    Status  Achieved      Luis Fernandez LONG TERM GOAL #3   Title  The patient will report no vertigo during daily tasks.    Time  4    Period  Weeks    Status  On-going    Target Date  01/05/19      Luis Fernandez LONG TERM GOAL #4   Title  Further balance assessment once vertigo cleared and HEP to address deficits if indicated.    Time  4    Period  Weeks    Status  On-going    Target Date  01/05/19            Plan - 12/16/18 1610    Clinical Impression Statement  Luis Fernandez continue to present with negative positional testing today for all peripheral canals bilaterally.  Unable to elicit the symptoms Luis Fernandez had been experiencing the past two mornings.  Reviewed how Luis Fernandez has been performing his morning LE stretches; Luis Fernandez has been transitioning to prone to perform quad and hip flexor stretch.  Reviewed with Luis Fernandez how to perform stretch in sidelying to minimize rolling from prone <> supine.  Luis Fernandez is likely experiencing some motion sensitivity and may benefit from habituation exercises in supine to allow Luis Fernandez to return to prone stretches.  Will continue to monitor and address as needed.      Luis Fernandez Treatment/Interventions  ADLs/Self Care Home Management;Canalith Repostioning;Therapeutic activities;Therapeutic exercise;Neuromuscular re-education;Balance training;Gait training;Functional mobility training;Patient/family education;Vestibular    Luis Fernandez Next Visit Plan  Has he continued to have dizziness in the morning with stretches?  Check HEP for balance, check gaze x 1 viewing, reassess for BPPV if indicated    Consulted and Agree with Plan of Care  Patient       Patient will benefit from skilled therapeutic intervention in order to  improve the following deficits and impairments:  Abnormal gait, Dizziness, Decreased balance  Visit Diagnosis: Other abnormalities of gait and mobility  Unsteadiness on feet  BPPV (benign paroxysmal positional vertigo), left     Problem List  Patient Active Problem List   Diagnosis Date Noted  . Lumbar strain 09/02/2017  . Paresthesia 06/23/2016  . OSA (obstructive sleep apnea) 06/23/2016  . B12 deficiency 01/21/2016  . Diabetes with neurologic complications ([place]) 05/39/7673  . Hyperlipidemia LDL goal <70 12/17/2015  . Hypertension associated with diabetes ([place]) 12/17/2015  . Overweight (BMI 25.0-29.9) 12/17/2015  . 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 Insomnia 12/17/2015    Luis Fernandez, Luis Fernandez, Luis Fernandez 12/16/18    4:17 PM    [facility] [address] [place], [place], [address] Phone: [phone]   Fax:  [phone]  Name: Luis Fernandez MRN: [id number] Date of Birth: 1945-08-27

## 2018-12-16 NOTE — Patient Instructions (Signed)
Quadriceps / Hip Flexor Stretch Side-Lying    On right side, bottom leg bent 90/90. Upper foot in end loop of strap. Bring strap behind back, hold with hands. Keeping leg level with hip, pull leg back by extending elbows. Hold _30__ seconds. Relax leg. Repeat _3__ times.  Copyright  VHI. All rights reserved.

## 2018-12-23 ENCOUNTER — Ambulatory Visit: Payer: Medicare Other | Admitting: Rehabilitative and Restorative Service Providers"

## 2018-12-23 ENCOUNTER — Other Ambulatory Visit: Payer: Self-pay

## 2018-12-23 ENCOUNTER — Encounter: Payer: Self-pay | Admitting: Rehabilitative and Restorative Service Providers"

## 2018-12-23 DIAGNOSIS — H8112 Benign paroxysmal vertigo, left ear: Secondary | ICD-10-CM | POA: Diagnosis not present

## 2018-12-23 DIAGNOSIS — R2681 Unsteadiness on feet: Secondary | ICD-10-CM

## 2018-12-23 DIAGNOSIS — R2689 Other abnormalities of gait and mobility: Secondary | ICD-10-CM

## 2018-12-23 NOTE — Therapy (Addendum)
[facility] [address] [place], [place], [address] Phone: [phone]   Fax:  [phone]  Physical Therapy Treatment  Patient Details  Name: Luis Fernandez MRN: [id number] Date of Birth: 07/31/1946 Referring Provider (PT): Lynne Leader, MD   Encounter Date: 12/23/2018  PT End of Session - 12/23/18 1043    Visit Number  4    Number of Visits  4    Date for PT Re-Evaluation  01/05/19    Authorization Type  medicare/ 10th visit progress note    PT Start Time  1025    PT Stop Time  1048    PT Time Calculation (min)  23 min    Activity Tolerance  Patient tolerated treatment well    Behavior During Therapy  [facility] for tasks assessed/performed       Past Medical History:  Diagnosis Date  . Diabetes mellitus without complication ([place])   . Hypertension   . 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 CARPAL TUNNEL RELEASE    . CATARACT EXTRACTION    . NOSE SURGERY    . TOTAL KNEE ARTHROPLASTY    . UMBILICAL HERNIA REPAIR      There were no vitals filed for this visit.  Subjective Assessment - 12/23/18 1024    Subjective  The patient reports he hasn't had any vertigo since last session.    Pertinent History  diabetes, HTN    Patient Stated Goals  Get rid of the dizziness.      Currently in Pain?  No/denies       CLINIC OPERATION CHANGES: [facility] is operating at a low capacity due to COVID-19.  The patient was brought into the clinic for evaluation and/or treatment following universal masking by staff, social distancing, and <10 people in the clinic.  The patient's COVID risk of complications score is 6.        Vestibular Assessment - 12/23/18 1051      Vestibular Assessment   General Observation  The patient notes vertigo resolved at this time.      Positional Testing   Dix-Hallpike   Dix-Hallpike Right;Dix-Hallpike Left    Horizontal Canal Testing  Horizontal Canal Right;Horizontal Canal Left      Dix-Hallpike Right   Dix-Hallpike Right Duration  none    Dix-Hallpike Right Symptoms  No nystagmus      Dix-Hallpike Left   Dix-Hallpike Left Duration  none    Dix-Hallpike Left Symptoms  No nystagmus      Sidelying Right   Sidelying Right Duration  none    Sidelying Right Symptoms  No nystagmus      Sidelying Left   Sidelying Left Duration  none    Sidelying Left Symptoms  No nystagmus               [facility] Adult PT Treatment/Exercise - 12/23/18 1052      Neuro Re-ed    Neuro Re-ed Details   Reviewed corner balance HEP of compliant standing + head motion, then compliant standing + eyes closed with cues on visual imagery x 15 seconds needing support from nearby wall for safety.      Vestibular Treatment/Exercise - 12/23/18 1051      Vestibular Treatment/Exercise   Vestibular Treatment Provided  Gaze    Gaze Exercises  X1 Viewing Horizontal      X1 Viewing Horizontal  Foot Position  with feet apart and then feet together with increased sway    Comments  30 seconds with cues on increasing speed of movement.            PT Education - 12/23/18 1049    Education Details  Reviewed all prior HEP and discussed continuing to challenge balance and with daily exercise.    Person(s) Educated  Patient    Methods  Explanation;Demonstration;Handout    Comprehension  Returned demonstration;Verbalized understanding          PT Long Term Goals - 12/23/18 1030      PT LONG TERM GOAL #1   Title  The patient will be indep with HEP for self mgmt of BPPV as indicated.    Time  4    Period  Weeks    Status  Achieved      PT LONG TERM GOAL #2   Title  The patient will have negative positional testing to indicate resolution of BPPV.    Baseline  Met on 12/07/2018    Time  4    Period  Weeks    Status  Achieved      PT LONG TERM GOAL #3   Title  The  patient will report no vertigo during daily tasks.    Time  4    Period  Weeks    Status  Achieved      PT LONG TERM GOAL #4   Title  Further balance assessment once vertigo cleared and HEP to address deficits if indicated.    Time  4    Period  Weeks    Status  Achieved            Plan - 12/23/18 1050    Clinical Impression Statement  The patient has met LTGs with no vertigo at this time.  He does continue with imbalance worse on compliant surfaces, in multi-sensory challenging environments, and during head motion.  PT recommends continued participation in HEP to manage balance.    PT Treatment/Interventions  ADLs/Self Care Home Management;Canalith Repostioning;Therapeutic activities;Therapeutic exercise;Neuromuscular re-education;Balance training;Gait training;Functional mobility training;Patient/family education;Vestibular    PT Next Visit Plan  On hold at this time-- met goals, but will wait 6 weeks before d/c to ensure vertigo remains resolved.    Consulted and Agree with Plan of Care  Patient       Patient will benefit from skilled therapeutic intervention in order to improve the following deficits and impairments:  Abnormal gait, Dizziness, Decreased balance  Visit Diagnosis: Other abnormalities of gait and mobility  Unsteadiness on feet     Problem List Patient Active Problem List   Diagnosis Date Noted  . Lumbar strain 09/02/2017  . Paresthesia 06/23/2016  . OSA (obstructive sleep apnea) 06/23/2016  . B12 deficiency 01/21/2016  . 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 Hyperlipidemia LDL goal <70 12/17/2015  . Hypertension associated with diabetes ([place]) 12/17/2015  . Overweight (BMI 25.0-29.9) 12/17/2015  . 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 Insomnia 12/17/2015   PHYSICAL THERAPY DISCHARGE SUMMARY  Visits from Start of Care: 4  Current functional level related to goals / functional outcomes: See above *patient did  not return to clinic   Remaining deficits: See above   Education / Equipment: Home program, nature of BPPV.  Plan: Patient agrees to discharge.  Patient goals were partially met. Patient is being discharged due to not returning since the last visit.  ?????  Thank you for the referral of this patient. Rudell Cobb, MPT  [place], PT 12/23/2018, 10:53 AM  [facility] [address] [place], [place], [address] Phone: [phone]   Fax:  [phone]  Name: Rexford Prevo MRN: [id number] Date of Birth: 1945/10/22

## 2018-12-27 ENCOUNTER — Ambulatory Visit: Payer: Medicare Other | Admitting: Rehabilitative and Restorative Service Providers"

## 2019-01-04 ENCOUNTER — Encounter: Payer: Self-pay | Admitting: Family Medicine

## 2019-01-04 ENCOUNTER — Ambulatory Visit (INDEPENDENT_AMBULATORY_CARE_PROVIDER_SITE_OTHER): Payer: Medicare Other | Admitting: Family Medicine

## 2019-01-04 VITALS — BP 132/76 | HR 71 | Temp 97.9°F | Wt 230.0 lb

## 2019-01-04 DIAGNOSIS — I152 Hypertension secondary to endocrine disorders: Secondary | ICD-10-CM

## 2019-01-04 DIAGNOSIS — N400 Enlarged prostate without lower urinary tract symptoms: Secondary | ICD-10-CM

## 2019-01-04 DIAGNOSIS — Z125 Encounter for screening for malignant neoplasm of prostate: Secondary | ICD-10-CM

## 2019-01-04 DIAGNOSIS — I10 Essential (primary) hypertension: Secondary | ICD-10-CM

## 2019-01-04 DIAGNOSIS — E785 Hyperlipidemia, unspecified: Secondary | ICD-10-CM | POA: Diagnosis not present

## 2019-01-04 DIAGNOSIS — E1142 Type 2 diabetes mellitus with diabetic polyneuropathy: Secondary | ICD-10-CM | POA: Diagnosis not present

## 2019-01-04 DIAGNOSIS — E1159 Type 2 diabetes mellitus with other circulatory complications: Secondary | ICD-10-CM

## 2019-01-04 DIAGNOSIS — E538 Deficiency of other specified B group vitamins: Secondary | ICD-10-CM

## 2019-01-04 MED ORDER — GLUCOSE BLOOD VI STRP: ORAL_STRIP | 99 refills | Status: DC

## 2019-01-04 NOTE — Patient Instructions (Signed)
Thank you for coming in today. Ok to add flonase and use astelin as needed.  Reasonable to also take claritin or allegra or zyrtec generic.

## 2019-01-04 NOTE — Progress Notes (Signed)
Karl BalesLawrence Choung is a 73 y.o. male who [name] today for follow-up hypertension diabetes and vertigo.   Patient was seen a month ago for vertigo thought to be BPPV type.  Had a significantly positive left-sided Dix-Hallpike test.  He was referred to vestibular physical therapy.  He had several visits most recently May 22.  That time the symptoms had resolved.  Diabetes: A1c last checked 1 month ago was 5.9.  Patient currently takes metformin Victoza and [place]Farxiga.  He notes Victoza is quite expensive but he tolerates it well.  He thinks Trulicity might be cheaper with his insurance.  Hypertension: Doing well with medications listed below.  No chest pain palpitation shortness of breath lightheadedness or dizziness aside from vertigo as noted above. ROS as above:  Exam:  BP 132/76   Pulse 71   Temp 97.9 F (36.6 C) (Oral)   Wt 230 lb (104.3 kg)   BMI 28.75 kg/m  Wt Readings from Last 5 Encounters:  01/04/19 230 lb (104.3 kg)  12/05/18 231 lb (104.8 kg)  11/30/18 231 lb (104.8 kg)  09/12/18 239 lb (108.4 kg)  08/05/18 234 lb (106.1 kg)    Gen: Well NAD HEENT: EOMI,  MMM Lungs: Normal work of breathing. CTABL Heart: RRR no MRG Abd: NABS, Soft. Nondistended, Nontender Exts: Brisk capillary refill, warm and well perfused.   Lab and Radiology Results No results found for this or any previous visit (from the past 72 hour(s)). No results found.    Assessment and Plan: 73 y.o. male with  Vertigo significant improvement with physical therapy watchful waiting.  Diabetes: Well-controlled continue current regimen.  Patient will investigate cost and will switch to Trulicity or Ozempic or by durian if cheaper than Victoza.  Otherwise we will continue current regimen.  Diabetic foot exam performed today.  Hypertension: Blood pressure controlled.   Continue current regimen.  Will obtain basic fasting labs listed below to follow-up hypertension and hyperlipidemia.  Also will recheck PSA as part of prostate cancer screening and BPH.  PDMP not reviewed this encounter. Orders Placed This Encounter  Procedures  . CBC  . COMPLETE METABOLIC PANEL WITH GFR  . Lipid Panel w/reflex Direct LDL  . PSA   Meds ordered this encounter  Medications  . glucose blood (ONE TOUCH ULTRA TEST) test strip    Sig: USE TO CHECK FOR BLOOD SUGAR THREE TIMES A DAY    Dispense:  300 each    Refill:  PRN     Historical information moved to improve visibility of documentation.  Past Medical History:  Diagnosis Date  . Diabetes mellitus without complication ([facility])   . Hypertension   . 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 CARPAL TUNNEL RELEASE    . CATARACT EXTRACTION    . NOSE SURGERY    . TOTAL KNEE ARTHROPLASTY    . UMBILICAL HERNIA REPAIR     Social History   Tobacco Use  . Smoking status: Former Games developermoker  . Smokeless tobacco: Never Used  Substance Use Topics  . Alcohol use: Yes    Alcohol/week: 1.0 standard drinks    Types: 1 Cans of beer per week    Comment: occasionally   family history includes Depression in his brother; Heart disease in his mother; Hypertension in his brother and mother; Parkinson's disease in his father.  Medications: Current Outpatient Medications  Medication Sig Dispense Refill  . azelastine (ASTELIN) 0.1 % nasal spray SPRAY ONE SPRAY IN EACH NOSTRIL TWICE DAILY 30 mL 9  . BD PEN NEEDLE NANO U/F 32G X 4 MM MISC Inject into skin once daily. Diagnosis DM: E11.42 100 each PRN  . benazepril (LOTENSIN) 20 MG tablet Take 1 tablet (20 mg total) by mouth daily. 90 tablet 3  . FARXIGA 10 MG TABS tablet TAKE ONE TABLET BY MOUTH DAILY 90 tablet 0  . fenofibrate (TRICOR) 145 MG tablet TAKE ONE TABLET BY MOUTH DAILY 90 tablet 1  .  Glucosamine 500 MG CAPS Take by mouth.    Marland Kitchen glucose blood (ONE TOUCH ULTRA TEST) test strip USE TO CHECK FOR BLOOD SUGAR THREE TIMES A DAY 300 each PRN  . ibuprofen (ADVIL,MOTRIN) 600 MG tablet TAKE ONE TABLET BY MOUTH EVERY 8 HOURS AS NEEDED 270 tablet 9  . liraglutide (VICTOZA) 18 MG/3ML SOPN DIAL AND INJECT SUBCUTANEOUSLY 1.8MG  DAILY 27 mL 3  . LORazepam (ATIVAN) 0.5 MG tablet TAKE ONE TABLET BY MOUTH DAILY AS NEEDED 30 tablet 4  . MELATONIN PO Take by mouth.    . metFORMIN (GLUCOPHAGE-XR) 500 MG 24 hr tablet TAKE TWO TABLETS BY MOUTH TWICE A DAY 360 tablet 2  . Multiple Vitamins-Minerals (MULTIVITAMIN PO) Take by mouth.    . Omega-3 Fatty Acids (FISH OIL PO) Take by mouth.    [name] DELICA LANCETS 33G MISC USE TO CHECK BLOOD SUGAR THREE TIMES A DAY 300 each PRN  . pravastatin (PRAVACHOL) 80 MG tablet TAKE ONE TABLET BY MOUTH DAILY 90 tablet 0  . temazepam (RESTORIL) 30 MG capsule TAKE ONE CAPSULE BY MOUTH AT BEDTIME AS NEEDED FOR SLEEP 30 capsule 5   No current facility-administered medications for this visit.    No Known Allergies   Discussed warning signs or symptoms. Please see discharge instructions. Patient expresses understanding.

## 2019-01-05 LAB — COMPLETE METABOLIC PANEL WITH GFR
AG Ratio: 2.2 (calc) (ref 1.0–2.5)
ALT: 27 U/L (ref 9–46)
AST: 31 U/L (ref 10–35)
Albumin: 4.7 g/dL (ref 3.6–5.1)
Alkaline phosphatase (APISO): 41 U/L (ref 35–144)
BUN/Creatinine Ratio: 29 (calc) — ABNORMAL HIGH (ref 6–22)
BUN: 27 mg/dL — ABNORMAL HIGH (ref 7–25)
CO2: 25 mmol/L (ref 20–32)
Calcium: 10.3 mg/dL (ref 8.6–10.3)
Chloride: 107 mmol/L (ref 98–110)
Creat: 0.93 mg/dL (ref 0.70–1.18)
GFR, Est African American: 94 mL/min/{1.73_m2} (ref >=60)
GFR, Est Non African American: 81 mL/min/{1.73_m2} (ref >=60)
Globulin: 2.1 g/dL (calc) (ref 1.9–3.7)
Glucose, Bld: 115 mg/dL — ABNORMAL HIGH (ref 65–99)
Potassium: 4.6 mmol/L (ref 3.5–5.3)
Sodium: 140 mmol/L (ref 135–146)
Total Bilirubin: 0.6 mg/dL (ref 0.2–1.2)
Total Protein: 6.8 g/dL (ref 6.1–8.1)

## 2019-01-05 LAB — CBC
HCT: 45.8 % (ref 38.5–50.0)
Hemoglobin: 16.2 g/dL (ref 13.2–17.1)
MCH: 33.6 pg — ABNORMAL HIGH (ref 27.0–33.0)
MCHC: 35.4 g/dL (ref 32.0–36.0)
MCV: 95 fL (ref 80.0–100.0)
MPV: 9.3 fL (ref 7.5–12.5)
Platelets: 287 10*3/uL (ref 140–400)
RBC: 4.82 10*6/uL (ref 4.20–5.80)
RDW: 12.2 % (ref 11.0–15.0)
WBC: 4.5 10*3/uL (ref 3.8–10.8)

## 2019-01-05 LAB — LIPID PANEL W/REFLEX DIRECT LDL
Cholesterol: 139 mg/dL (ref <200)
HDL: 37 mg/dL — ABNORMAL LOW (ref >=40)
LDL Cholesterol (Calc): 78 mg/dL (calc)
Non-HDL Cholesterol (Calc): 102 mg/dL (calc) (ref <130)
Total CHOL/HDL Ratio: 3.8 (calc) (ref <5.0)
Triglycerides: 140 mg/dL (ref <150)

## 2019-01-05 LAB — PSA: PSA: 1 ng/mL (ref <=4.0)

## 2019-01-18 ENCOUNTER — Other Ambulatory Visit: Payer: Self-pay | Admitting: Family Medicine

## 2019-01-18 DIAGNOSIS — E1142 Type 2 diabetes mellitus with diabetic polyneuropathy: Secondary | ICD-10-CM

## 2019-02-11 ENCOUNTER — Other Ambulatory Visit: Payer: Self-pay | Admitting: Family Medicine

## 2019-02-11 DIAGNOSIS — E1142 Type 2 diabetes mellitus with diabetic polyneuropathy: Secondary | ICD-10-CM

## 2019-02-14 ENCOUNTER — Encounter: Payer: Self-pay | Admitting: Family Medicine

## 2019-03-05 ENCOUNTER — Other Ambulatory Visit: Payer: Self-pay | Admitting: Family Medicine

## 2019-03-05 DIAGNOSIS — E785 Hyperlipidemia, unspecified: Secondary | ICD-10-CM

## 2019-03-09 ENCOUNTER — Encounter: Payer: Self-pay | Admitting: Family Medicine

## 2019-03-09 MED ORDER — TRULICITY 1.5 MG/0.5ML ~~LOC~~ SOAJ: 1.0000 "pen " | SUBCUTANEOUS | 4 refills | Status: DC

## 2019-03-16 ENCOUNTER — Other Ambulatory Visit: Payer: Self-pay | Admitting: Family Medicine

## 2019-03-28 ENCOUNTER — Other Ambulatory Visit: Payer: Self-pay | Admitting: Family Medicine

## 2019-04-05 ENCOUNTER — Other Ambulatory Visit: Payer: Self-pay | Admitting: Family Medicine

## 2019-04-17 ENCOUNTER — Telehealth: Payer: Self-pay

## 2019-04-17 NOTE — Telephone Encounter (Signed)
Pt going out of town on Sunday and will need Temazepam refilled early.   Can Kristopher Oppenheim have the OK to RF early on Saturday? Last date it was filled was 03/28/19, so it will be 5 days early.

## 2019-04-18 NOTE — Telephone Encounter (Signed)
Please notify Luis Fernandez okay to refill early due to travel.

## 2019-04-18 NOTE — Telephone Encounter (Signed)
I called Sonia Side at [facility] and gave him the ok to refill early per PCP recommendation. He did not have any questions.

## 2019-04-19 ENCOUNTER — Encounter: Payer: Self-pay | Admitting: Family Medicine

## 2019-04-19 ENCOUNTER — Ambulatory Visit (INDEPENDENT_AMBULATORY_CARE_PROVIDER_SITE_OTHER): Payer: Medicare Other | Admitting: Family Medicine

## 2019-04-19 DIAGNOSIS — Z23 Encounter for immunization: Secondary | ICD-10-CM

## 2019-04-23 ENCOUNTER — Encounter: Payer: Self-pay | Admitting: Rehabilitative and Restorative Service Providers"

## 2019-05-04 ENCOUNTER — Telehealth: Payer: Self-pay | Admitting: Family Medicine

## 2019-05-04 ENCOUNTER — Other Ambulatory Visit: Payer: Self-pay | Admitting: Family Medicine

## 2019-05-04 ENCOUNTER — Ambulatory Visit (INDEPENDENT_AMBULATORY_CARE_PROVIDER_SITE_OTHER): Payer: Medicare Other | Admitting: Family Medicine

## 2019-05-04 DIAGNOSIS — J0111 Acute recurrent frontal sinusitis: Secondary | ICD-10-CM

## 2019-05-04 MED ORDER — CEPHALEXIN 500 MG PO CAPS: 500.0000 mg | ORAL_CAPSULE | Freq: Three times a day (TID) | ORAL | 1 refills | Status: DC

## 2019-05-04 NOTE — Telephone Encounter (Signed)
Patient wanted to know if he can get cephALEXin (KEFLEX) 500 MG capsule [[id number]] ENDED called in. States that that he has been feeling like this for a week, sinus issues. Would like to get this started. If it can be called it please call it in to  [facility] [place], [place] but on the website is [place]. Phone number to that pharmacy is: [phone]. Please call if it can be called in.

## 2019-05-04 NOTE — Progress Notes (Signed)
Virtual Visit  via Video Note  I connected with      Luis Fernandez by a video enabled telemedicine application and verified that I am speaking with the correct person using two identifiers.   I discussed the limitations of evaluation and management by telemedicine and the availability of in person appointments. The patient expressed understanding and agreed to proceed.  History of Present Illness: Luis Fernandez is a 73 y.o. male who would like to discuss sinusitis.  Patient has a one-week history of sinus pain and pressure associated with nasal discharge.  He tried over-the-counter medications which have not helped.  His symptoms are consistent with previous episodes of sinus infection.  He has had good results in the past with Keflex 500 mg 3 times daily for 10 days and would like to start that if possible.  He has a follow-up with me scheduled on Tuesday of next week for follow-up diabetes and other health concerns.     Observations/Objective: There were no vitals taken for this visit. Wt Readings from Last 5 Encounters:  01/04/19 230 lb (104.3 kg)  12/05/18 231 lb (104.8 kg)  11/30/18 231 lb (104.8 kg)  09/12/18 239 lb (108.4 kg)  08/05/18 234 lb (106.1 kg)   Exam: Appearance nontoxic no acute distress Normal Speech.  No tachypnea or wheezing.  Able to complete sentences.  Lab and Radiology Results No results found for this or any previous visit (from the past 72 hour(s)). No results found.   Assessment and Plan: 73 y.o. male with sinusitis.  Failing conservative management with some second sickening symptoms.  Plan to proceed with treatment with Keflex as below.  Recheck next week as scheduled.  Return sooner if needed.  PDMP not reviewed this encounter. No orders of the defined types were placed in this encounter.  Meds ordered this encounter  Medications  . cephALEXin (KEFLEX) 500 MG capsule    Sig: Take 1 capsule (500 mg total) by mouth 3 (three) times daily.     Dispense:  30 capsule    Refill:  1    Follow Up Instructions:    I discussed the assessment and treatment plan with the patient. The patient was provided an opportunity to ask questions and all were answered. The patient agreed with the plan and demonstrated an understanding of the instructions.   The patient was advised to call back or seek an in-person evaluation if the symptoms worsen or if the condition fails to improve as anticipated.  Time: 10 minutes of intraservice time, with >15 minutes of total time during today's visit.      Historical information moved to improve visibility of documentation.  Past Medical History:  Diagnosis Date  . Diabetes mellitus without complication ([facility])   . Hypertension   . 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 CARPAL TUNNEL RELEASE    . CATARACT EXTRACTION    . NOSE SURGERY    . TOTAL KNEE ARTHROPLASTY    . UMBILICAL HERNIA REPAIR     Social History   Tobacco Use  . Smoking status: Former Games developer  . Smokeless tobacco: Never Used  Substance Use Topics  . Alcohol use: Yes    Alcohol/week: 1.0 standard drinks    Types: 1 Cans of beer per week    Comment: occasionally   family history includes Depression in his brother; Heart disease in his mother; Hypertension in his  brother and mother; Parkinson's disease in his father.  Medications: Current Outpatient Medications  Medication Sig Dispense Refill  . azelastine (ASTELIN) 0.1 % nasal spray SPRAY ONE SPRAY IN EACH NOSTRIL TWICE DAILY 30 mL 9  . BD PEN NEEDLE NANO U/F 32G X 4 MM MISC INJECT INTO THE SKIN ONCE DAILY 100 each PRN  . benazepril (LOTENSIN) 20 MG tablet Take 1 tablet (20 mg total) by mouth daily. 90 tablet 3  . cephALEXin (KEFLEX) 500 MG capsule Take 1 capsule (500 mg total) by mouth 3 (three) times daily. 30 capsule 1  . Dulaglutide (TRULICITY) 1.5 NT/6.1WE SOPN Inject 1 pen  into the skin once a week. 12 pen 4  . FARXIGA 10 MG TABS tablet TAKE ONE TABLET BY MOUTH DAILY 90 tablet 0  . fenofibrate (TRICOR) 145 MG tablet TAKE ONE TABLET BY MOUTH DAILY 90 tablet 1  . Glucosamine 500 MG CAPS Take by mouth.    Marland Kitchen glucose blood (ONE TOUCH ULTRA TEST) test strip USE TO CHECK FOR BLOOD SUGAR THREE TIMES A DAY 300 each PRN  . ibuprofen (ADVIL,MOTRIN) 600 MG tablet TAKE ONE TABLET BY MOUTH EVERY 8 HOURS AS NEEDED 270 tablet 9  . Lancets (ONETOUCH DELICA PLUS [id number]) MISC USE TO CHECK BLOOD SUGAR THREE TIMES A DAY 300 each PRN  . LORazepam (ATIVAN) 0.5 MG tablet TAKE ONE TABLET BY MOUTH DAILY AS NEEDED 30 tablet 4  . MELATONIN PO Take by mouth.    . metFORMIN (GLUCOPHAGE-XR) 500 MG 24 hr tablet TAKE TWO TABLETS BY MOUTH TWICE A DAY 360 tablet 1  . Multiple Vitamins-Minerals (MULTIVITAMIN PO) Take by mouth.    . Omega-3 Fatty Acids (FISH OIL PO) Take by mouth.    . pravastatin (PRAVACHOL) 80 MG tablet TAKE ONE TABLET BY MOUTH DAILY 90 tablet 2  . temazepam (RESTORIL) 30 MG capsule TAKE ONE CAPSULE BY MOUTH EVERY NIGHT AT BEDTIME AS NEEDED FOR SLEEP 30 capsule 5   No current facility-administered medications for this visit.    No Known Allergies

## 2019-05-05 NOTE — Telephone Encounter (Signed)
Kristopher Oppenheim requesting med refills for lorazepam and fenofibrate.

## 2019-05-08 ENCOUNTER — Telehealth: Payer: Self-pay | Admitting: Family Medicine

## 2019-05-08 NOTE — Telephone Encounter (Signed)
Patient called, saying he is still having sinus infection issues that is causing Vertigo, had a Virtual with you recently about this, coming in tomorrow for an In Office appointment that he wants to keep. Patient is wanting a referral to Rudell Cobb for this Vertigo issue. Please Advise.

## 2019-05-09 ENCOUNTER — Other Ambulatory Visit: Payer: Self-pay

## 2019-05-09 ENCOUNTER — Ambulatory Visit (INDEPENDENT_AMBULATORY_CARE_PROVIDER_SITE_OTHER): Payer: Medicare Other | Admitting: Family Medicine

## 2019-05-09 VITALS — BP 136/81 | HR 72 | Temp 98.2°F | Wt 232.0 lb

## 2019-05-09 DIAGNOSIS — E1159 Type 2 diabetes mellitus with other circulatory complications: Secondary | ICD-10-CM | POA: Diagnosis not present

## 2019-05-09 DIAGNOSIS — E1142 Type 2 diabetes mellitus with diabetic polyneuropathy: Secondary | ICD-10-CM

## 2019-05-09 DIAGNOSIS — I10 Essential (primary) hypertension: Secondary | ICD-10-CM | POA: Diagnosis not present

## 2019-05-09 DIAGNOSIS — G4700 Insomnia, unspecified: Secondary | ICD-10-CM

## 2019-05-09 DIAGNOSIS — E785 Hyperlipidemia, unspecified: Secondary | ICD-10-CM | POA: Diagnosis not present

## 2019-05-09 DIAGNOSIS — E663 Overweight: Secondary | ICD-10-CM

## 2019-05-09 DIAGNOSIS — I152 Hypertension secondary to endocrine disorders: Secondary | ICD-10-CM

## 2019-05-09 LAB — POCT GLYCOSYLATED HEMOGLOBIN (HGB A1C): HbA1c, POC (prediabetic range): 6.2 % (ref 5.7–6.4)

## 2019-05-09 MED ORDER — FARXIGA 10 MG PO TABS: 10.0000 mg | ORAL_TABLET | Freq: Every day | ORAL | 3 refills | Status: DC

## 2019-05-09 MED ORDER — CEPHALEXIN 500 MG PO CAPS: 500.0000 mg | ORAL_CAPSULE | Freq: Three times a day (TID) | ORAL | 1 refills | Status: DC

## 2019-05-09 MED ORDER — TETANUS-DIPHTH-ACELL PERTUSSIS 5-2-15.5 LF-MCG/0.5 IM SUSP: 0.5000 mL | Freq: Once | INTRAMUSCULAR | 0 refills | Status: AC

## 2019-05-09 NOTE — Patient Instructions (Addendum)
Thank you for coming in today. Continue current medicine.  Recheck with Dr Zigmund Daniel in 6 months.  Reschedule eye exam soon.  Use keflex prior to dental procedure to protect your knee.  You should be able to get you Tdap vaccine at the pharmacy.

## 2019-05-09 NOTE — Telephone Encounter (Signed)
Will see in clinic today.

## 2019-05-09 NOTE — Progress Notes (Signed)
Luis Fernandez is a 73 y.o. male who presents to [name]: Primary Care Sports Medicine today for follow-up diabetes hypertension hyperlipidemia and recent sinus infection.  Diabetes: Currently managed with Trulicity, Farxiga, and metformin.  Blood sugar typically well controlled.  No polyuria or polydipsia.  Patient tolerates regimen well.  He tries to eat a careful diet to keep the carbs low and the weight off.  Patient had to reschedule his diabetic eye exam recently but has it scheduled for the end of this month.  Hypertension: Managed with benazepril.  Patient tolerates regimen well with no issues.  No lightheadedness or dizziness.  Patient also has hyperlipidemia managed with high-dose pravastatin and fenofibrate.  Tolerates regimen well.  LDL was checked about 4 months ago and was 78.  He feels well.  He recently was treated for a sinus infection with Keflex which he is tolerated well in the past.  He notes this is resolved completely.  He had some vertigo yesterday but that resolved without any treatment further needed.  He does have a history of insomnia which he uses lorazepam and sometimes temazepam.  He notes this works well to control symptoms.  He has been on this regimen for quite some time with no issues.  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  He will take Keflex prophylactically prior to dental procedure to avoid infection.  ROS as above:  Exam:  BP 136/81   Pulse 72   Temp 98.2 F (36.8 C) (Oral)   Wt 232 lb (105.2 kg)   BMI 29.00 kg/m  Wt Readings from Last 5 Encounters:  05/09/19 232 lb (105.2 kg)  01/04/19 230 lb (104.3 kg)  12/05/18 231 lb (104.8 kg)  11/30/18 231 lb (104.8 kg)  09/12/18 239 lb (108.4 kg)    Gen: Well NAD HEENT: EOMI,  MMM Lungs: Normal work of breathing. CTABL Heart: RRR no MRG Abd: NABS, Soft. Nondistended, Nontender Exts: Brisk  capillary refill, warm and well perfused.   Lab and Radiology Results Results for orders placed or performed in visit on 05/09/19 (from the past 72 hour(s))  POCT HgB A1C     Status: None   Collection Time: 05/09/19 10:50 AM  Result Value Ref Range   Hemoglobin A1C     HbA1c POC (<> result, manual entry)     HbA1c, POC (prediabetic range) 6.2 5.7 - 6.4 %   HbA1c, POC (controlled diabetic range)     No results found.    Assessment and Plan: 73 y.o. male with  Diabetes: Well-controlled continue current regimen recheck 6 months with new PCP.  Hypertension also well controlled metabolic panel checked recently.  Continue current regimen.  Hyperlipidemia doing well continue current regimen.  Insomnia continue current regimen.  Patient due for Tdap vaccine.  Will prescribe to pharmacy.  Recommend repeat diabetic eye exam soon.  Recheck 6 months.  PDMP not reviewed this encounter. Orders Placed This Encounter  Procedures  . POCT HgB A1C   Meds ordered this encounter  Medications  . dapagliflozin propanediol (FARXIGA) 10 MG TABS tablet    Sig: Take 10 mg by mouth daily.    Dispense:  90 tablet    Refill:  3  . cephALEXin (KEFLEX) 500 MG capsule    Sig: Take 1 capsule (500 mg total) by mouth 3 (three) times daily.    Dispense:  30 capsule    Refill:  1  . Tdap (ADACEL) 12-02-13.5 LF-MCG/0.5 injection  Sig: Inject 0.5 mLs into the muscle once for 1 dose.    Dispense:  0.5 mL    Refill:  0     Historical information moved to improve visibility of documentation.  Past Medical History:  Diagnosis Date  . Diabetes mellitus without complication ([facility])   . Hypertension   . 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 CARPAL TUNNEL RELEASE    . CATARACT EXTRACTION    . NOSE SURGERY    . TOTAL KNEE ARTHROPLASTY    . UMBILICAL HERNIA REPAIR     Social History   Tobacco Use  .  Smoking status: Former Research scientist (life sciences)  . Smokeless tobacco: Never Used  Substance Use Topics  . Alcohol use: Yes    Alcohol/week: 1.0 standard drinks    Types: 1 Cans of beer per week    Comment: occasionally   family history includes Depression in his brother; Heart disease in his mother; Hypertension in his brother and mother; Parkinson's disease in his father.  Medications: Current Outpatient Medications  Medication Sig Dispense Refill  . benazepril (LOTENSIN) 20 MG tablet Take 1 tablet (20 mg total) by mouth daily. 90 tablet 3  . cephALEXin (KEFLEX) 500 MG capsule Take 1 capsule (500 mg total) by mouth 3 (three) times daily. 30 capsule 1  . [START ON 06/15/2019] dapagliflozin propanediol (FARXIGA) 10 MG TABS tablet Take 10 mg by mouth daily. 90 tablet 3  . Dulaglutide (TRULICITY) 1.5 HE/1.7EY SOPN Inject 1 pen into the skin once a week. 12 pen 4  . fenofibrate (TRICOR) 145 MG tablet TAKE ONE TABLET BY MOUTH DAILY 90 tablet 0  . Glucosamine 500 MG CAPS Take by mouth.    Marland Kitchen glucose blood (ONE TOUCH ULTRA TEST) test strip USE TO CHECK FOR BLOOD SUGAR THREE TIMES A DAY 300 each PRN  . ibuprofen (ADVIL,MOTRIN) 600 MG tablet TAKE ONE TABLET BY MOUTH EVERY 8 HOURS AS NEEDED 270 tablet 9  . Lancets (ONETOUCH DELICA PLUS [id number]) MISC USE TO CHECK BLOOD SUGAR THREE TIMES A DAY 300 each PRN  . LORazepam (ATIVAN) 0.5 MG tablet TAKE ONE TABLET BY MOUTH DAILY AS NEEDED 30 tablet 5  . MELATONIN PO Take by mouth.    . metFORMIN (GLUCOPHAGE-XR) 500 MG 24 hr tablet TAKE TWO TABLETS BY MOUTH TWICE A DAY 360 tablet 1  . Multiple Vitamins-Minerals (MULTIVITAMIN PO) Take by mouth.    . Omega-3 Fatty Acids (FISH OIL PO) Take by mouth.    . pravastatin (PRAVACHOL) 80 MG tablet TAKE ONE TABLET BY MOUTH DAILY 90 tablet 2  . Tdap (ADACEL) 12-02-13.5 LF-MCG/0.5 injection Inject 0.5 mLs into the muscle once for 1 dose. 0.5 mL 0  . temazepam (RESTORIL) 30 MG capsule TAKE ONE CAPSULE BY MOUTH EVERY NIGHT AT BEDTIME AS  NEEDED FOR SLEEP 30 capsule 5   No current facility-administered medications for this visit.    No Known Allergies   Discussed warning signs or symptoms. Please see discharge instructions. Patient expresses understanding.

## 2019-05-18 ENCOUNTER — Encounter: Payer: Self-pay | Admitting: Family Medicine

## 2019-05-23 MED ORDER — BENAZEPRIL HCL 20 MG PO TABS: 20.0000 mg | ORAL_TABLET | Freq: Every day | ORAL | 3 refills | Status: DC

## 2019-05-23 MED ORDER — FARXIGA 10 MG PO TABS: 10.0000 mg | ORAL_TABLET | Freq: Every day | ORAL | 3 refills | Status: DC

## 2019-05-23 MED ORDER — METFORMIN HCL ER 500 MG PO TB24: 1000.0000 mg | ORAL_TABLET | Freq: Two times a day (BID) | ORAL | 3 refills | Status: DC

## 2019-06-02 LAB — HM DIABETES EYE EXAM

## 2019-08-02 ENCOUNTER — Other Ambulatory Visit: Payer: Self-pay | Admitting: Family Medicine

## 2019-09-08 ENCOUNTER — Other Ambulatory Visit: Payer: Self-pay | Admitting: Family Medicine

## 2019-09-08 ENCOUNTER — Telehealth: Payer: Self-pay | Admitting: Family Medicine

## 2019-09-08 MED ORDER — CEPHALEXIN 500 MG PO CAPS: ORAL_CAPSULE | ORAL | 1 refills | Status: DC

## 2019-09-08 NOTE — Telephone Encounter (Signed)
I called the patient and he did not have any questions.

## 2019-09-08 NOTE — Telephone Encounter (Signed)
Patient called and reports that he has an upcoming dental procedure and has been told in the past that he will have to take Keflex. He wants to know if you would be willing to send this to the [facility]. Please advise.

## 2019-09-08 NOTE — Telephone Encounter (Signed)
Rx for cephalexin renewed.

## 2019-09-18 ENCOUNTER — Ambulatory Visit: Payer: Medicare Other

## 2019-09-24 ENCOUNTER — Other Ambulatory Visit: Payer: Self-pay | Admitting: Family Medicine

## 2019-09-27 ENCOUNTER — Other Ambulatory Visit: Payer: Self-pay

## 2019-09-27 ENCOUNTER — Encounter: Payer: Self-pay | Admitting: Family Medicine

## 2019-09-27 ENCOUNTER — Ambulatory Visit (INDEPENDENT_AMBULATORY_CARE_PROVIDER_SITE_OTHER): Payer: Medicare Other | Admitting: Family Medicine

## 2019-09-27 DIAGNOSIS — E1159 Type 2 diabetes mellitus with other circulatory complications: Secondary | ICD-10-CM | POA: Diagnosis not present

## 2019-09-27 DIAGNOSIS — I10 Essential (primary) hypertension: Secondary | ICD-10-CM

## 2019-09-27 DIAGNOSIS — E785 Hyperlipidemia, unspecified: Secondary | ICD-10-CM | POA: Diagnosis not present

## 2019-09-27 DIAGNOSIS — G4700 Insomnia, unspecified: Secondary | ICD-10-CM | POA: Diagnosis not present

## 2019-09-27 DIAGNOSIS — I152 Hypertension secondary to endocrine disorders: Secondary | ICD-10-CM

## 2019-09-27 DIAGNOSIS — E1142 Type 2 diabetes mellitus with diabetic polyneuropathy: Secondary | ICD-10-CM | POA: Diagnosis not present

## 2019-09-27 MED ORDER — FENOFIBRATE 145 MG PO TABS: 145.0000 mg | ORAL_TABLET | Freq: Every day | ORAL | 1 refills | Status: DC

## 2019-09-27 MED ORDER — TEMAZEPAM 30 MG PO CAPS: ORAL_CAPSULE | ORAL | 5 refills | Status: DC

## 2019-09-27 NOTE — Assessment & Plan Note (Signed)
Lab Results  Component Value Date   LDLCALC 78 01/04/2019  Tolerating pravastatin and fenofibrate well, continue at current dose.  Fenofibrate renewed.

## 2019-09-27 NOTE — Assessment & Plan Note (Signed)
Stable with current dose of temazepam.   Refill ordered.

## 2019-09-27 NOTE — Progress Notes (Signed)
Luis Fernandez - 74 y.o. male MRN [id number]  Date of birth: 06/02/1946  Subjective Chief Complaint  Patient presents with  . Hyperlipidemia  . Insomnia    HPI Luis Fernandez is 74 y.o. male with history of HTN, mixed HLD, T2DM with neuropathy, and insomnia here today for routine follow up.   -HTN:  BP has been well controlled with benazepril.  He reports he is doing well with current dose.  He denies chest pain, shortness of breath, palpitations, headache or vision changes.   -HLD:  Mixed hyperlipidemia managed with pravastatin and fenofibrate.  He is doing well with current medications.  He needs refill of fenofibrate.    -T2DM:  Current mgmt with metformin, trulicity and farxiga.  He is doing well with current medications.  A1c 05/2019 was 6.2%.  He denies symptoms of hypoglycemia.  Weight is up some since last visit.   -Insomnia:  Well controlled with temazepam.  Denies side effects including morning fatigue or oversedation.   ROS:  A comprehensive ROS was completed and negative except as noted per HPI  No Known Allergies  Past Medical History:  Diagnosis Date  . Diabetes mellitus without complication ([facility])   . Hypertension   . 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 CARPAL TUNNEL RELEASE    . CATARACT EXTRACTION    . NOSE SURGERY    . TOTAL KNEE ARTHROPLASTY    . UMBILICAL HERNIA REPAIR      Social History   Socioeconomic History  . Marital status: Married    Spouse name: Johnny Bridge  . Number of children: 1  . Years of education: 70  . Highest education level: Master's degree (e.g., MA, MS, MEng, MEd, MSW, MBA)  Occupational History  . Occupation: Clinical research associate    Comment: retired  Tobacco Use  . Smoking status: Former Games developer  . Smokeless tobacco: Never Used  Substance and Sexual Activity  . Alcohol use: Yes    Alcohol/week: 1.0 standard drinks    Types: 1 Cans of beer  per week    Comment: occasionally  . Drug use: No  . Sexual activity: Yes  Other Topics Concern  . Not on file  Social History Narrative   Walks his dog for 1 hour every day. Goes to the gym as well and strength training and aerobic exercise.. Caffeine use daily   Social Determinants of Health   Financial Resource Strain:   . Difficulty of Paying Living Expenses: Not on file  Food Insecurity:   . Worried About Programme researcher, broadcasting/film/video in the Last Year: Not on file  . Ran Out of Food in the Last Year: Not on file  Transportation Needs:   . Lack of Transportation (Medical): Not on file  . Lack of Transportation (Non-Medical): Not on file  Physical Activity:   . Days of Exercise per Week: Not on file  . Minutes of Exercise per Session: Not on file  Stress:   . Feeling of Stress : Not on file  Social Connections:   . Frequency of Communication with Friends and Family: Not on file  . Frequency of Social Gatherings with Friends and Family: Not on file  . Attends Religious Services: Not on file  . Active Member of Clubs or Organizations: Not on file  . Attends Banker Meetings: Not on file  . Marital Status: Not on file    Family History  Problem Relation Age of Onset  . Heart disease Mother   . Hypertension Mother   . Hypertension Brother   . Depression Brother   . Parkinson's disease Father     Health Maintenance  Topic Date Due  . OPHTHALMOLOGY EXAM  03/16/2019  . TETANUS/TDAP  09/26/2020 (Originally 03/05/2019)  . HEMOGLOBIN A1C  11/07/2019  . FOOT EXAM  01/04/2020  . Fecal DNA (Cologuard)  01/06/2020  . INFLUENZA VACCINE  Completed  . Hepatitis C Screening  Completed  . PNA vac Low Risk Adult  Completed     ----------------------------------------------------------------------------------------------------------------------------------------------------------------------------------------------------------------- Physical Exam BP (!) 143/84   Pulse 85    Temp 98.2 F (36.8 C) (Oral)   Ht 6\' 3"  (1.905 m)   Wt 242 lb (109.8 kg)   BMI 30.25 kg/m   Physical Exam Constitutional:      Appearance: Normal appearance.  HENT:     Head: Normocephalic and atraumatic.  Eyes:     General: No scleral icterus. Cardiovascular:     Rate and Rhythm: Normal rate and regular rhythm.  Pulmonary:     Effort: Pulmonary effort is normal.     Breath sounds: Normal breath sounds.  Musculoskeletal:     Cervical back: Neck supple.  Skin:    General: Skin is warm and dry.  Neurological:     General: No focal deficit present.     Mental Status: He is alert.  Psychiatric:        Mood and Affect: Mood normal.        Behavior: Behavior normal.     ------------------------------------------------------------------------------------------------------------------------------------------------------------------------------------------------------------------- Assessment and Plan  Hypertension associated with diabetes ([place]) Blood pressure is at goal at for age and co-morbidities.  I recommend he continue benazepril at current dose.  In addition they were instructed to follow a low sodium diet with regular exercise to help to maintain adequate control of blood pressure.    Diabetes with neurologic complications ([place]) Most recent A1c of  Lab Results  Component Value Date   HGBA1C 6.2 05/09/2019   indicates diabetes is well controlled.  He will continue current medications.  Counseled on healthy, low carb diet and recommend frequent activity to help with maintaining good control of blood sugars.      Hyperlipidemia LDL goal <70 Lab Results  Component Value Date   LDLCALC 78 01/04/2019  Tolerating pravastatin and fenofibrate well, continue at current dose.  Fenofibrate renewed.   Insomnia Stable with current dose of temazepam.   Refill ordered.    Meds ordered this encounter  Medications  . fenofibrate (TRICOR) 145 MG tablet    Sig: Take 1  tablet (145 mg total) by mouth daily.    Dispense:  90 tablet    Refill:  1  . temazepam (RESTORIL) 30 MG capsule    Sig: TAKE ONE CAPSULE BY MOUTH EVERY NIGHT AT BEDTIME AS NEEDED FOR SLEEP    Dispense:  30 capsule    Refill:  5    Return in about 14 weeks (around 01/03/2020) for DM.    This visit occurred during the SARS-CoV-2 public health emergency.  Safety protocols were in place, including screening questions prior to the visit, additional usage of staff PPE, and extensive cleaning of exam room while observing appropriate contact time as indicated for disinfecting solutions.

## 2019-09-27 NOTE — Patient Instructions (Signed)
Great to meet you today! Continue current medications.  See me again in about 3-4 months

## 2019-09-27 NOTE — Assessment & Plan Note (Signed)
Blood pressure is at goal at for age and co-morbidities.  I recommend he continue benazepril at current dose.  In addition they were instructed to follow a low sodium diet with regular exercise to help to maintain adequate control of blood pressure.

## 2019-09-27 NOTE — Assessment & Plan Note (Signed)
Most recent A1c of  Lab Results  Component Value Date   HGBA1C 6.2 05/09/2019   indicates diabetes is well controlled.  He will continue current medications.  Counseled on healthy, low carb diet and recommend frequent activity to help with maintaining good control of blood sugars.

## 2019-11-20 ENCOUNTER — Other Ambulatory Visit: Payer: Self-pay | Admitting: Family Medicine

## 2019-11-21 ENCOUNTER — Other Ambulatory Visit: Payer: Self-pay | Admitting: *Deleted

## 2019-11-23 ENCOUNTER — Other Ambulatory Visit: Payer: Self-pay | Admitting: Family Medicine

## 2019-11-26 ENCOUNTER — Other Ambulatory Visit: Payer: Self-pay | Admitting: Family Medicine

## 2019-11-26 DIAGNOSIS — E785 Hyperlipidemia, unspecified: Secondary | ICD-10-CM

## 2019-11-30 ENCOUNTER — Other Ambulatory Visit: Payer: Self-pay

## 2019-11-30 DIAGNOSIS — E785 Hyperlipidemia, unspecified: Secondary | ICD-10-CM

## 2019-11-30 MED ORDER — IBUPROFEN 600 MG PO TABS: 600.0000 mg | ORAL_TABLET | Freq: Three times a day (TID) | ORAL | 9 refills | Status: DC | PRN

## 2019-11-30 MED ORDER — PRAVASTATIN SODIUM 80 MG PO TABS: 80.0000 mg | ORAL_TABLET | Freq: Every day | ORAL | 1 refills | Status: DC

## 2019-12-06 ENCOUNTER — Telehealth: Payer: Self-pay

## 2019-12-06 NOTE — Telephone Encounter (Signed)
Luis Fernandez called and states for a couple of days he has had pressure in face and dizziness. He states he has had multiple sinus infections and these are the symptoms he has every time. I tried to schedule him for a virtual visit and he declined. I also advised him he could go to the urgent care and he declined. He asked if I would send the message to Dr Ashley Royalty because all he wants is an antibiotic. He reports he usually receives Keflex.

## 2019-12-06 NOTE — Telephone Encounter (Signed)
Recommend virtual visit for treatment/prescription management.  Cephalexin would not be appropriate first line therapy for sinus infection.

## 2019-12-07 ENCOUNTER — Encounter: Payer: Self-pay | Admitting: Family Medicine

## 2019-12-07 ENCOUNTER — Telehealth (INDEPENDENT_AMBULATORY_CARE_PROVIDER_SITE_OTHER): Payer: Medicare Other | Admitting: Family Medicine

## 2019-12-07 VITALS — Wt 227.0 lb

## 2019-12-07 DIAGNOSIS — J329 Chronic sinusitis, unspecified: Secondary | ICD-10-CM | POA: Diagnosis not present

## 2019-12-07 DIAGNOSIS — J0100 Acute maxillary sinusitis, unspecified: Secondary | ICD-10-CM | POA: Insufficient documentation

## 2019-12-07 DIAGNOSIS — J0101 Acute recurrent maxillary sinusitis: Secondary | ICD-10-CM | POA: Diagnosis not present

## 2019-12-07 MED ORDER — CEPHALEXIN 500 MG PO CAPS: 500.0000 mg | ORAL_CAPSULE | Freq: Three times a day (TID) | ORAL | 0 refills | Status: DC

## 2019-12-07 NOTE — Progress Notes (Signed)
Had 5 sinus infections in the past year.   Pain across forehead and sinuses.  Headache Neck Tension  Yesterday: positional vertigo  Thinks he needs a referral to ENT.  He states that in the past Dr. Denyse Amass gave him a 10 day round of Keflex to knock it out.

## 2019-12-07 NOTE — Assessment & Plan Note (Signed)
He is insistent that cephalexin is the only thing that works well for his sinus infection.  I agreed to prescribe but if not seeing improvement within a few days I asked that he let me know.   I will place referral to ENT per his request as well.  He is instructed to call back with new or worsening symptoms.

## 2019-12-07 NOTE — Telephone Encounter (Signed)
Patient has been scheduled

## 2019-12-07 NOTE — Progress Notes (Signed)
Luis Fernandez - 74 y.o. male MRN [id number]  Date of birth: 11-22-1945   This visit type was conducted due to national recommendations for restrictions regarding the COVID-19 Pandemic (e.g. social distancing).  This format is felt to be most appropriate for this patient at this time.  All issues noted in this document were discussed and addressed.  No physical exam was performed (except for noted visual exam findings with Video Visits).  I discussed the limitations of evaluation and management by telemedicine and the availability of in person appointments. The patient expressed understanding and agreed to proceed.  I connected with@ on 12/07/19 at 10:50 AM EDT by a video enabled telemedicine application and verified that I am speaking with the correct person using two identifiers.  Present at visit: Luetta Nutting, DO Venancio Poisson   Patient Location: [facility] [address] [place] [address]   Provider location:   [place]  No chief complaint on file.   HPI  Luis Fernandez is a 74 y.o. male who presents via audio/video conferencing for a telehealth visit today.  He has complaint today of sinusitis.  This is a recurrent issue for him, reports that he has had about 5 times over the past year.  Current symptoms include sinus pain, headache, vertigo and yellow/green mucus.  Dr. Georgina Snell has prescribed cephalexin for him previously which has worked well to resolve this.  He states he has tried several other antibiotics from urgent cares and cephalexin is the only thing thing that has worked for him.  He denies fever, chills, shortness of breath.  He does also request referral to ENT for recurrent episodes.     ROS:  A comprehensive ROS was completed and negative except as noted per HPI  Past Medical History:  Diagnosis Date  . Diabetes mellitus without complication ([place])   . Hypertension   . 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 CARPAL TUNNEL RELEASE    . CATARACT EXTRACTION    . NOSE SURGERY    . TOTAL KNEE ARTHROPLASTY    . UMBILICAL HERNIA REPAIR      Family History  Problem Relation Age of Onset  . Heart disease Mother   . Hypertension Mother   . Hypertension Brother   . Depression Brother   . Parkinson's disease Father     Social History   Socioeconomic History  . Marital status: Married    Spouse name: [name]  . Number of children: 1  . Years of education: 39  . Highest education level: Master's degree (e.g., MA, MS, MEng, MEd, MSW, MBA)  Occupational History  . Occupation: Chief Executive Officer    Comment: retired  Tobacco Use  . Smoking status: Former Research scientist (life sciences)  . Smokeless tobacco: Never Used  Substance and Sexual Activity  . Alcohol use: Yes    Alcohol/week: 1.0 standard drinks    Types: 1 Cans of beer per week    Comment: occasionally  . Drug use: No  . Sexual activity: Yes  Other Topics Concern  . Not on file  Social History Narrative   Walks his dog for 1 hour every day. Goes to the gym as well and strength training and aerobic exercise.. Caffeine use daily   Social Determinants of Health   Financial Resource Strain:   . Difficulty of Paying Living Expenses:   Food Insecurity:   . Worried About Charity fundraiser in the Last  Year:   . Ran Out of Food in the Last Year:   Transportation Needs:   . Freight forwarder (Medical):   Marland Kitchen Lack of Transportation (Non-Medical):   Physical Activity:   . Days of Exercise per Week:   . Minutes of Exercise per Session:   Stress:   . Feeling of Stress :   Social Connections:   . Frequency of Communication with Friends and Family:   . Frequency of Social Gatherings with Friends and Family:   . Attends Religious Services:   . Active Member of Clubs or Organizations:   . Attends Banker Meetings:   Marland Kitchen Marital Status:   Intimate Partner Violence:   . Fear of Current or Ex-Partner:   .  Emotionally Abused:   Marland Kitchen Physically Abused:   . Sexually Abused:      Current Outpatient Medications:  .  benazepril (LOTENSIN) 20 MG tablet, Take 1 tablet (20 mg total) by mouth daily., Disp: 90 tablet, Rfl: 3 .  dapagliflozin propanediol (FARXIGA) 10 MG TABS tablet, Take 10 mg by mouth daily., Disp: 90 tablet, Rfl: 3 .  Dulaglutide (TRULICITY) 1.5 MG/0.5ML SOPN, Inject 1 pen into the skin once a week., Disp: 12 pen, Rfl: 4 .  fenofibrate (TRICOR) 145 MG tablet, Take 1 tablet (145 mg total) by mouth daily., Disp: 90 tablet, Rfl: 1 .  Glucosamine 500 MG CAPS, Take by mouth., Disp: , Rfl:  .  glucose blood (ONE TOUCH ULTRA TEST) test strip, USE TO CHECK FOR BLOOD SUGAR THREE TIMES A DAY, Disp: 300 each, Rfl: PRN .  ibuprofen (ADVIL) 600 MG tablet, Take 1 tablet (600 mg total) by mouth every 8 (eight) hours as needed., Disp: 270 tablet, Rfl: 9 .  Lancets (ONETOUCH DELICA PLUS LANCET33G) MISC, USE TO CHECK BLOOD SUGAR THREE TIMES A DAY, Disp: 300 each, Rfl: PRN .  LORazepam (ATIVAN) 0.5 MG tablet, TAKE ONE TABLET BY MOUTH DAILY AS NEEDED, Disp: 30 tablet, Rfl: 4 .  MELATONIN PO, Take by mouth., Disp: , Rfl:  .  metFORMIN (GLUCOPHAGE-XR) 500 MG 24 hr tablet, Take 2 tablets (1,000 mg total) by mouth 2 (two) times daily., Disp: 360 tablet, Rfl: 3 .  Multiple Vitamins-Minerals (MULTIVITAMIN PO), Take by mouth., Disp: , Rfl:  .  Omega-3 Fatty Acids (FISH OIL PO), Take by mouth., Disp: , Rfl:  .  pravastatin (PRAVACHOL) 80 MG tablet, Take 1 tablet (80 mg total) by mouth daily., Disp: 30 tablet, Rfl: 1 .  temazepam (RESTORIL) 30 MG capsule, TAKE ONE CAPSULE BY MOUTH EVERY NIGHT AT BEDTIME AS NEEDED FOR SLEEP, Disp: 30 capsule, Rfl: 5 .  cephALEXin (KEFLEX) 500 MG capsule, Take 1 capsule (500 mg total) by mouth 3 (three) times daily for 14 days., Disp: 42 capsule, Rfl: 0  EXAM:  VITALS per patient if applicable: Wt 227 lb (103 kg)   BMI 28.37 kg/m   GENERAL: alert, oriented, appears well and in no  acute distress  HEENT: atraumatic, conjunttiva clear, no obvious abnormalities on inspection of external nose and ears  NECK: normal movements of the head and neck  LUNGS: on inspection no signs of respiratory distress, breathing rate appears normal, no obvious gross SOB, gasping or wheezing  CV: no obvious cyanosis  MS: moves all visible extremities without noticeable abnormality  PSYCH/NEURO: pleasant and cooperative, no obvious depression or anxiety, speech and thought processing grossly intact  ASSESSMENT AND PLAN:  Discussed the following assessment and plan:  Acute maxillary sinusitis He is  insistent that cephalexin is the only thing that works well for his sinus infection.  I agreed to prescribe but if not seeing improvement within a few days I asked that he let me know.   I will place referral to ENT per his request as well.  He is instructed to call back with new or worsening symptoms.   30 minutes spent including pre visit preparation, review of prior notes and labs, encounter with patient via video visit and same day documentation.     I discussed the assessment and treatment plan with the patient. The patient was provided an opportunity to ask questions and all were answered. The patient agreed with the plan and demonstrated an understanding of the instructions.   The patient was advised to call back or seek an in-person evaluation if the symptoms worsen or if the condition fails to improve as anticipated.    [name], DO

## 2019-12-19 ENCOUNTER — Other Ambulatory Visit (INDEPENDENT_AMBULATORY_CARE_PROVIDER_SITE_OTHER): Payer: Self-pay

## 2019-12-19 ENCOUNTER — Encounter (INDEPENDENT_AMBULATORY_CARE_PROVIDER_SITE_OTHER): Payer: Self-pay | Admitting: Otolaryngology

## 2019-12-19 ENCOUNTER — Other Ambulatory Visit: Payer: Self-pay

## 2019-12-19 ENCOUNTER — Ambulatory Visit (INDEPENDENT_AMBULATORY_CARE_PROVIDER_SITE_OTHER): Payer: Medicare Other | Admitting: Otolaryngology

## 2019-12-19 VITALS — Temp 97.2°F

## 2019-12-19 DIAGNOSIS — J310 Chronic rhinitis: Secondary | ICD-10-CM

## 2019-12-19 DIAGNOSIS — Z87898 Personal history of other specified conditions: Secondary | ICD-10-CM | POA: Diagnosis not present

## 2019-12-19 DIAGNOSIS — J329 Chronic sinusitis, unspecified: Secondary | ICD-10-CM

## 2019-12-19 NOTE — Progress Notes (Signed)
HPI: Luis Fernandez is a 74 y.o. male who presents is referred by Dr. Zigmund Daniel for evaluation of chronic sinus problems.  He states that he has had 5 sinus infections this past year.  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  He has seen physical therapy for this and has undergone the Epley maneuver in the past.  When he gets his sinus infections he describes pain pressure in his cheeks as well as headaches and some pain behind his head.  He responds best to cephalexin and is presently completing a course of cephalexin.  He has no fever associated with a sinus infections. He has had previous nasal surgery which sounds to be a septoplasty and turbinate reductions performed over 30 years ago he has had no sinus x-rays.. Patient uses Flonase regularly usually twice a day.  He also uses saline sinus rinse on a as needed basis.  Past Medical History:  Diagnosis Date  . Diabetes mellitus without complication ([place])   . Hypertension   . 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 CARPAL TUNNEL RELEASE    . CATARACT EXTRACTION    . NOSE SURGERY    . TOTAL KNEE ARTHROPLASTY    . UMBILICAL HERNIA REPAIR     Social History   Socioeconomic History  . Marital status: Married    Spouse name: [name]  . Number of children: 1  . Years of education: 84  . Highest education level: Master's degree (e.g., MA, MS, MEng, MEd, MSW, MBA)  Occupational History  . Occupation: Chief Executive Officer    Comment: retired  Tobacco Use  . Smoking status: Former Research scientist (life sciences)  . Smokeless tobacco: Never Used  Substance and Sexual Activity  . Alcohol use: Yes    Alcohol/week: 1.0 standard drinks    Types: 1 Cans of beer per week    Comment: occasionally  . Drug use: No  . Sexual activity: Yes  Other Topics Concern  . Not on file  Social History Narrative   Walks his dog for 1 hour every  day. Goes to the gym as well and strength training and aerobic exercise.. Caffeine use daily   Social Determinants of Health   Financial Resource Strain:   . Difficulty of Paying Living Expenses:   Food Insecurity:   . Worried About Charity fundraiser in the Last Year:   . Arboriculturist in the Last Year:   Transportation Needs:   . Film/video editor (Medical):   Marland Kitchen Lack of Transportation (Non-Medical):   Physical Activity:   . Days of Exercise per Week:   . Minutes of Exercise per Session:   Stress:   . Feeling of Stress :   Social Connections:   . Frequency of Communication with Friends and Family:   . Frequency of Social Gatherings with Friends and Family:   . Attends Religious Services:   . Active Member of Clubs or Organizations:   . Attends Archivist Meetings:   Marland Kitchen Marital Status:    Family History  Problem Relation Age of Onset  . Heart disease Mother   . Hypertension Mother   . Hypertension Brother   . Depression Brother   . Parkinson's disease Father    No Known Allergies Prior to Admission medications   Medication Sig Start Date End Date Taking? Authorizing Provider  benazepril (LOTENSIN) 20  MG tablet Take 1 tablet (20 mg total) by mouth daily. 05/23/19  Yes Rodolph Bong, MD  cephALEXin (KEFLEX) 500 MG capsule Take 1 capsule (500 mg total) by mouth 3 (three) times daily for 14 days. 12/07/19 12/21/19 Yes [name], DO  dapagliflozin propanediol (FARXIGA) 10 MG TABS tablet Take 10 mg by mouth daily. 06/15/19  Yes Rodolph Bong, MD  Dulaglutide (TRULICITY) 1.5 MG/0.5ML SOPN Inject 1 pen into the skin once a week. 03/09/19  Yes Rodolph Bong, MD  fenofibrate (TRICOR) 145 MG tablet Take 1 tablet (145 mg total) by mouth daily. 09/27/19  Yes [name], DO  Glucosamine 500 MG CAPS Take by mouth.   Yes [provider]  glucose blood (ONE TOUCH ULTRA TEST) test strip USE TO CHECK FOR BLOOD SUGAR THREE TIMES A DAY 01/04/19  Yes Rodolph Bong, MD   ibuprofen (ADVIL) 600 MG tablet Take 1 tablet (600 mg total) by mouth every 8 (eight) hours as needed. 11/30/19  Yes Ashley Royalty, Selena Batten, DO  Lancets (ONETOUCH DELICA PLUS LANCET33G) MISC USE TO CHECK BLOOD SUGAR THREE TIMES A DAY 02/13/19  Yes Rodolph Bong, MD  LORazepam (ATIVAN) 0.5 MG tablet TAKE ONE TABLET BY MOUTH DAILY AS NEEDED 11/23/19  Yes [name], DO  MELATONIN PO Take by mouth.   Yes [provider]  metFORMIN (GLUCOPHAGE-XR) 500 MG 24 hr tablet Take 2 tablets (1,000 mg total) by mouth 2 (two) times daily. 05/23/19  Yes Rodolph Bong, MD  Multiple Vitamins-Minerals (MULTIVITAMIN PO) Take by mouth.   Yes [provider]  Omega-3 Fatty Acids (FISH OIL PO) Take by mouth.   Yes [provider]  pravastatin (PRAVACHOL) 80 MG tablet Take 1 tablet (80 mg total) by mouth daily. 11/30/19  Yes Matthews, Cody, DO  temazepam (RESTORIL) 30 MG capsule TAKE ONE CAPSULE BY MOUTH EVERY NIGHT AT BEDTIME AS NEEDED FOR SLEEP 09/27/19  Yes [name], DO     Positive ROS: Otherwise negative  All other systems have been reviewed and were otherwise negative with the exception of those mentioned in the HPI and as above.  Physical Exam: Constitutional: Alert, well-appearing, no acute distress Ears: External ears without lesions or tenderness. Ear canals are clear bilaterally.  He has small ear canals and wears bilateral hearing aids.  Both TMs are clear.  He has no BPV today in the office. Nasal: External nose without lesions. Septum mildly deviated to the right with mild rhinitis..  Both millimeters regions were clear with no clinical signs of active infection presently. Oral: Lips and gums without lesions. Tongue and palate mucosa without lesions. Posterior oropharynx clear. Neck: No palpable adenopathy or masses Respiratory: Breathing comfortably  Skin: No facial/neck lesions or rash noted.  Procedures  Assessment: History of recurrent sinus infections. History of  BPV.  Plan: I gave patient information on BPPV.  I discussed with him that there is really no physiologic relationship between BPV and sinus infections. We will plan on scheduling a CT scan to better evaluate the sinuses to see if he has any chronic sinus problems or obstruction of the sinuses. In the meantime he will continue with regular use of his nasal steroid spray and saline rinses as needed which is the medical therapy for recurrent sinus problems. Patient will call us following the CT scan concerning results.   Narda Bonds, MD   CC:

## 2019-12-25 ENCOUNTER — Telehealth: Payer: Self-pay

## 2019-12-25 NOTE — Telephone Encounter (Signed)
Pharmacy called stating patient dropped Ativan in the sink.  Requesting early refill.   One-time refill granted by Dr. Ashley Royalty.

## 2020-01-03 ENCOUNTER — Ambulatory Visit
Admission: RE | Admit: 2020-01-03 | Discharge: 2020-01-03 | Disposition: A | Discharge status: Home / Self Care | Payer: Medicare Other | Source: Ambulatory Visit | Attending: Otolaryngology | Admitting: Otolaryngology

## 2020-01-03 ENCOUNTER — Ambulatory Visit: Payer: Medicare Other | Admitting: Family Medicine

## 2020-01-03 ENCOUNTER — Other Ambulatory Visit: Payer: Self-pay

## 2020-01-03 DIAGNOSIS — J329 Chronic sinusitis, unspecified: Secondary | ICD-10-CM

## 2020-01-07 ENCOUNTER — Other Ambulatory Visit: Payer: Self-pay | Admitting: Family Medicine

## 2020-01-10 ENCOUNTER — Other Ambulatory Visit: Payer: Self-pay

## 2020-01-10 ENCOUNTER — Encounter: Payer: Self-pay | Admitting: Family Medicine

## 2020-01-10 ENCOUNTER — Ambulatory Visit (INDEPENDENT_AMBULATORY_CARE_PROVIDER_SITE_OTHER): Payer: Medicare Other | Admitting: Family Medicine

## 2020-01-10 VITALS — BP 120/68 | HR 74 | Temp 98.1°F | Ht 75.0 in | Wt 233.0 lb

## 2020-01-10 DIAGNOSIS — G4700 Insomnia, unspecified: Secondary | ICD-10-CM | POA: Diagnosis not present

## 2020-01-10 DIAGNOSIS — E0840 Diabetes mellitus due to underlying condition with diabetic neuropathy, unspecified: Secondary | ICD-10-CM

## 2020-01-10 DIAGNOSIS — J329 Chronic sinusitis, unspecified: Secondary | ICD-10-CM | POA: Diagnosis not present

## 2020-01-10 DIAGNOSIS — E1159 Type 2 diabetes mellitus with other circulatory complications: Secondary | ICD-10-CM

## 2020-01-10 DIAGNOSIS — E785 Hyperlipidemia, unspecified: Secondary | ICD-10-CM

## 2020-01-10 DIAGNOSIS — I10 Essential (primary) hypertension: Secondary | ICD-10-CM | POA: Diagnosis not present

## 2020-01-10 DIAGNOSIS — Z1211 Encounter for screening for malignant neoplasm of colon: Secondary | ICD-10-CM | POA: Diagnosis not present

## 2020-01-10 DIAGNOSIS — I152 Hypertension secondary to endocrine disorders: Secondary | ICD-10-CM

## 2020-01-10 DIAGNOSIS — E538 Deficiency of other specified B group vitamins: Secondary | ICD-10-CM

## 2020-01-10 LAB — POCT UA - MICROALBUMIN
Albumin/Creatinine Ratio, Urine, POC: <30
Creatinine, POC: 100 mg/dL
Microalbumin Ur, POC: 10 mg/L

## 2020-01-10 LAB — POCT GLYCOSYLATED HEMOGLOBIN (HGB A1C): Hemoglobin A1C: 6 % — AB (ref 4.0–5.6)

## 2020-01-10 MED ORDER — MONTELUKAST SODIUM 10 MG PO TABS
10.0000 mg | ORAL_TABLET | Freq: Every day | ORAL | 2 refills | Status: DC
Start: 2020-01-10 — End: 2020-09-30

## 2020-01-10 MED ORDER — METFORMIN HCL ER 500 MG PO TB24: 1000.0000 mg | ORAL_TABLET | Freq: Two times a day (BID) | ORAL | 3 refills | Status: DC

## 2020-01-10 MED ORDER — FENOFIBRATE 145 MG PO TABS: 145.0000 mg | ORAL_TABLET | Freq: Every day | ORAL | 3 refills | Status: DC

## 2020-01-10 MED ORDER — TRULICITY 1.5 MG/0.5ML ~~LOC~~ SOAJ: 1.5000 mg | SUBCUTANEOUS | 3 refills | Status: DC

## 2020-01-10 MED ORDER — PRAVASTATIN SODIUM 80 MG PO TABS: 80.0000 mg | ORAL_TABLET | Freq: Every day | ORAL | 3 refills | Status: DC

## 2020-01-10 MED ORDER — TEMAZEPAM 30 MG PO CAPS: ORAL_CAPSULE | ORAL | 5 refills | Status: DC

## 2020-01-10 MED ORDER — LORAZEPAM 0.5 MG PO TABS: 0.5000 mg | ORAL_TABLET | Freq: Every day | ORAL | 4 refills | Status: DC | PRN

## 2020-01-10 NOTE — Assessment & Plan Note (Signed)
Recent CT scan normal.  Possible allergy component.  Continue flonase.  Will see if singulair is helpful for this.

## 2020-01-10 NOTE — Progress Notes (Signed)
Luis Fernandez - 74 y.o. male MRN [id number]  Date of birth: 03/04/1946  Subjective Chief Complaint  Patient presents with  . Diabetes    HPI Luis Fernandez is a 74 y.o. male with history of HTN, T2DM with neuropathy, HLD, insomnia and recurrent episodes of sinusitis here today for follow up.   -T2DM:  Current management with metformin, farxiga and trulicity.  Blood sugars at home have been well controlled.  He denies symptoms of hypoglycemia.   -HTN:  Treated with benazepril.  He is doing well with this without significant side effects.  He denies chest pain, shortness of breath, palpitations, headache or vision changes.   -HLD:  Tolerating pravastatin well.  Denies myalgias.   -Recurrent sinusitis:  Had has recurrent episodes of sinusitis since moving to this area.  He is using flonase daily along with saline rinses as needed.  He was seen by ENT recently and had normal CT sinuses. He does feel like allergies may be worse living here.   -Insomnia: Well managed with temazepam.  Denies over sedation with current dose.   ROS:  A comprehensive ROS was completed and negative except as noted per HPI  No Known Allergies  Past Medical History:  Diagnosis Date  . Diabetes mellitus without complication ([place])   . Hypertension   . 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 CARPAL TUNNEL RELEASE    . CATARACT EXTRACTION    . NOSE SURGERY    . TOTAL KNEE ARTHROPLASTY    . UMBILICAL HERNIA REPAIR      Social History   Socioeconomic History  . Marital status: Married    Spouse name: [name]  . Number of children: 1  . Years of education: 44  . Highest education level: Master's degree (e.g., MA, MS, MEng, MEd, MSW, MBA)  Occupational History  . Occupation: Chief Executive Officer    Comment: retired  Tobacco Use  . Smoking status: Former Research scientist (life sciences)  . Smokeless tobacco: Never Used  Substance and Sexual  Activity  . Alcohol use: Yes    Alcohol/week: 1.0 standard drinks    Types: 1 Cans of beer per week    Comment: occasionally  . Drug use: No  . Sexual activity: Yes  Other Topics Concern  . Not on file  Social History Narrative   Walks his dog for 1 hour every day. Goes to the gym as well and strength training and aerobic exercise.. Caffeine use daily   Social Determinants of Health   Financial Resource Strain:   . Difficulty of Paying Living Expenses:   Food Insecurity:   . Worried About Charity fundraiser in the Last Year:   . Arboriculturist in the Last Year:   Transportation Needs:   . Film/video editor (Medical):   Marland Kitchen Lack of Transportation (Non-Medical):   Physical Activity:   . Days of Exercise per Week:   . Minutes of Exercise per Session:   Stress:   . Feeling of Stress :   Social Connections:   . Frequency of Communication with Friends and Family:   . Frequency of Social Gatherings with Friends and Family:   . Attends Religious Services:   . Active Member of Clubs or Organizations:   . Attends Archivist Meetings:   Marland Kitchen Marital Status:     Family History  Problem Relation Age of Onset  . Heart disease Mother   .  Hypertension Mother   . Hypertension Brother   . Depression Brother   . Parkinson's disease Father     Health Maintenance  Topic Date Due  . OPHTHALMOLOGY EXAM  03/16/2019  . HEMOGLOBIN A1C  11/07/2019  . Fecal DNA (Cologuard)  01/06/2020  . FOOT EXAM  01/04/2020  . TETANUS/TDAP  09/26/2020 (Originally 03/05/2019)  . INFLUENZA VACCINE  03/03/2020  . COVID-19 Vaccine  Completed  . Hepatitis C Screening  Completed  . PNA vac Low Risk Adult  Completed     ----------------------------------------------------------------------------------------------------------------------------------------------------------------------------------------------------------------- Physical Exam BP 120/68   Pulse 74   Temp 98.1 F (36.7 C) (Oral)    Ht 6\' 3"  (1.905 m)   Wt 233 lb (105.7 kg)   SpO2 97% Comment: on RA  BMI 29.12 kg/m   Physical Exam Constitutional:      Appearance: Normal appearance.  HENT:     Head: Normocephalic and atraumatic.  Eyes:     General: No scleral icterus. Cardiovascular:     Rate and Rhythm: Normal rate and regular rhythm.  Pulmonary:     Effort: Pulmonary effort is normal.     Breath sounds: Normal breath sounds.  Musculoskeletal:     Cervical back: Neck supple.  Neurological:     General: No focal deficit present.     Mental Status: He is alert.  Psychiatric:        Mood and Affect: Mood normal.        Behavior: Behavior normal.     ------------------------------------------------------------------------------------------------------------------------------------------------------------------------------------------------------------------- Assessment and Plan  Hypertension associated with diabetes (HCC) Blood pressure is at goal at for age and co-morbidities.  I recommend he continue current medications.  In addition they were instructed to follow a low sodium diet with regular exercise to help to maintain adequate control of blood pressure.    Diabetes with neurologic complications (HCC) Most recent A1c of  Lab Results  Component Value Date   HGBA1C 6.0 (A) 01/10/2020   indicates diabetes is well controlled.  He will continue trulicity, metformin and farxiga.  Counseled on healthy, low carb diet and recommend frequent activity to help with maintaining good control of blood sugars.    Hyperlipidemia LDL goal <70 Tolerating pravastatin well. Update lipid panel.   B12 deficiency Recheck B12 levels  Insomnia Well controlled with temazepam.  No significant side effects related to this, will continue at present dose.   Recurrent sinusitis Recent CT scan normal.  Possible allergy component.  Continue flonase.  Will see if singulair is helpful for this.    Meds ordered this  encounter  Medications  . Dulaglutide (TRULICITY) 1.5 MG/0.5ML SOPN    Sig: Inject 0.5 mLs (1.5 mg total) into the skin once a week.    Dispense:  12 pen    Refill:  3  . fenofibrate (TRICOR) 145 MG tablet    Sig: Take 1 tablet (145 mg total) by mouth daily.    Dispense:  90 tablet    Refill:  3  . LORazepam (ATIVAN) 0.5 MG tablet    Sig: Take 1 tablet (0.5 mg total) by mouth daily as needed.    Dispense:  30 tablet    Refill:  4  . metFORMIN (GLUCOPHAGE-XR) 500 MG 24 hr tablet    Sig: Take 2 tablets (1,000 mg total) by mouth 2 (two) times daily.    Dispense:  360 tablet    Refill:  3  . pravastatin (PRAVACHOL) 80 MG tablet    Sig: Take 1 tablet (80  mg total) by mouth daily.    Dispense:  90 tablet    Refill:  3  . temazepam (RESTORIL) 30 MG capsule    Sig: TAKE ONE CAPSULE BY MOUTH EVERY NIGHT AT BEDTIME AS NEEDED FOR SLEEP    Dispense:  30 capsule    Refill:  5  . montelukast (SINGULAIR) 10 MG tablet    Sig: Take 1 tablet (10 mg total) by mouth at bedtime.    Dispense:  90 tablet    Refill:  2    Return in about 6 months (around 07/11/2020) for T2DM/HTN.    This visit occurred during the SARS-CoV-2 public health emergency.  Safety protocols were in place, including screening questions prior to the visit, additional usage of staff PPE, and extensive cleaning of exam room while observing appropriate contact time as indicated for disinfecting solutions.

## 2020-01-10 NOTE — Assessment & Plan Note (Signed)
Recheck B12 levels.   

## 2020-01-10 NOTE — Assessment & Plan Note (Signed)
Tolerating pravastatin well.  Update lipid panel.  

## 2020-01-10 NOTE — Assessment & Plan Note (Signed)
Well controlled with temazepam.  No significant side effects related to this, will continue at present dose.

## 2020-01-10 NOTE — Assessment & Plan Note (Signed)
Blood pressure is at goal at for age and co-morbidities.  I recommend he continue current medications.  In addition they were instructed to follow a low sodium diet with regular exercise to help to maintain adequate control of blood pressure.   

## 2020-01-10 NOTE — Patient Instructions (Addendum)
Great to see you today!  Hope you have a nice birthday! Please continue current medications I have added on singulair to help with allergies.  Have labs completed, we'll be in touch with results.  You should receive cologuard in the mail. I think we can space follow up out 6 months since blood sugars and BP remain well controlled.

## 2020-01-10 NOTE — Assessment & Plan Note (Signed)
Most recent A1c of  Lab Results  Component Value Date   HGBA1C 6.0 (A) 01/10/2020   indicates diabetes is well controlled.  He will continue trulicity, metformin and farxiga.  Counseled on healthy, low carb diet and recommend frequent activity to help with maintaining good control of blood sugars.

## 2020-01-11 ENCOUNTER — Ambulatory Visit (INDEPENDENT_AMBULATORY_CARE_PROVIDER_SITE_OTHER): Payer: Medicare Other | Admitting: Otolaryngology

## 2020-01-11 ENCOUNTER — Other Ambulatory Visit: Payer: Self-pay

## 2020-01-11 DIAGNOSIS — R42 Dizziness and giddiness: Secondary | ICD-10-CM

## 2020-01-11 LAB — LIPID PANEL
Cholesterol: 138 mg/dL (ref <200)
HDL: 33 mg/dL — ABNORMAL LOW (ref >=40)
LDL Cholesterol (Calc): 78 mg/dL (calc)
Non-HDL Cholesterol (Calc): 105 mg/dL (calc) (ref <130)
Total CHOL/HDL Ratio: 4.2 (calc) (ref <5.0)
Triglycerides: 172 mg/dL — ABNORMAL HIGH (ref <150)

## 2020-01-11 LAB — COMPLETE METABOLIC PANEL WITH GFR
AG Ratio: 2.2 (calc) (ref 1.0–2.5)
ALT: 28 U/L (ref 9–46)
AST: 27 U/L (ref 10–35)
Albumin: 4.7 g/dL (ref 3.6–5.1)
Alkaline phosphatase (APISO): 43 U/L (ref 35–144)
BUN: 24 mg/dL (ref 7–25)
CO2: 25 mmol/L (ref 20–32)
Calcium: 10.2 mg/dL (ref 8.6–10.3)
Chloride: 108 mmol/L (ref 98–110)
Creat: 0.9 mg/dL (ref 0.70–1.18)
GFR, Est African American: 97 mL/min/{1.73_m2} (ref >=60)
GFR, Est Non African American: 84 mL/min/{1.73_m2} (ref >=60)
Globulin: 2.1 g/dL (calc) (ref 1.9–3.7)
Glucose, Bld: 118 mg/dL — ABNORMAL HIGH (ref 65–99)
Potassium: 4.8 mmol/L (ref 3.5–5.3)
Sodium: 142 mmol/L (ref 135–146)
Total Bilirubin: 0.7 mg/dL (ref 0.2–1.2)
Total Protein: 6.8 g/dL (ref 6.1–8.1)

## 2020-01-11 LAB — CBC
HCT: 47.9 % (ref 38.5–50.0)
Hemoglobin: 16.5 g/dL (ref 13.2–17.1)
MCH: 33.2 pg — ABNORMAL HIGH (ref 27.0–33.0)
MCHC: 34.4 g/dL (ref 32.0–36.0)
MCV: 96.4 fL (ref 80.0–100.0)
MPV: 9.3 fL (ref 7.5–12.5)
Platelets: 269 10*3/uL (ref 140–400)
RBC: 4.97 10*6/uL (ref 4.20–5.80)
RDW: 12.3 % (ref 11.0–15.0)
WBC: 4.6 10*3/uL (ref 3.8–10.8)

## 2020-01-11 LAB — VITAMIN B12: Vitamin B-12: 435 pg/mL (ref 200–1100)

## 2020-01-11 MED ORDER — MECLIZINE HCL 25 MG PO TABS: 25.0000 mg | ORAL_TABLET | Freq: Three times a day (TID) | ORAL | 3 refills | Status: DC | PRN

## 2020-01-15 DIAGNOSIS — Z1212 Encounter for screening for malignant neoplasm of rectum: Secondary | ICD-10-CM | POA: Diagnosis not present

## 2020-01-15 DIAGNOSIS — Z1211 Encounter for screening for malignant neoplasm of colon: Secondary | ICD-10-CM | POA: Diagnosis not present

## 2020-01-24 ENCOUNTER — Telehealth: Payer: Self-pay | Admitting: Osteopathic Medicine

## 2020-01-24 DIAGNOSIS — R195 Other fecal abnormalities: Secondary | ICD-10-CM

## 2020-01-24 NOTE — Telephone Encounter (Signed)
Dr. Lyn Hollingshead covering for Dr. Ashley Royalty, Cologuard test was positive, I placed referral for patient to see gastroenterology for colonoscopy for diagnosis.

## 2020-01-25 ENCOUNTER — Encounter: Payer: Self-pay | Admitting: Gastroenterology

## 2020-01-25 NOTE — Telephone Encounter (Signed)
Patient advised of results and recommendations, verbalized understanding. No further inquiries during phone call.  

## 2020-01-31 ENCOUNTER — Encounter: Payer: Self-pay | Admitting: Family Medicine

## 2020-02-03 ENCOUNTER — Other Ambulatory Visit: Payer: Self-pay | Admitting: Family Medicine

## 2020-02-03 DIAGNOSIS — E785 Hyperlipidemia, unspecified: Secondary | ICD-10-CM

## 2020-02-22 ENCOUNTER — Other Ambulatory Visit: Payer: Self-pay | Admitting: Family Medicine

## 2020-02-22 DIAGNOSIS — E1142 Type 2 diabetes mellitus with diabetic polyneuropathy: Secondary | ICD-10-CM

## 2020-02-23 ENCOUNTER — Other Ambulatory Visit: Payer: Self-pay | Admitting: Family Medicine

## 2020-02-23 DIAGNOSIS — E1142 Type 2 diabetes mellitus with diabetic polyneuropathy: Secondary | ICD-10-CM

## 2020-02-26 ENCOUNTER — Other Ambulatory Visit: Payer: Self-pay

## 2020-02-26 DIAGNOSIS — E1142 Type 2 diabetes mellitus with diabetic polyneuropathy: Secondary | ICD-10-CM

## 2020-02-26 MED ORDER — GLUCOSE BLOOD VI STRP: ORAL_STRIP | 99 refills | Status: DC

## 2020-02-27 ENCOUNTER — Ambulatory Visit: Payer: Medicare Other | Admitting: Gastroenterology

## 2020-02-27 ENCOUNTER — Encounter: Payer: Self-pay | Admitting: Gastroenterology

## 2020-02-27 ENCOUNTER — Ambulatory Visit (INDEPENDENT_AMBULATORY_CARE_PROVIDER_SITE_OTHER): Payer: Medicare Other | Admitting: Gastroenterology

## 2020-02-27 VITALS — BP 106/70 | HR 77 | Ht 75.0 in | Wt 239.4 lb

## 2020-02-27 DIAGNOSIS — R195 Other fecal abnormalities: Secondary | ICD-10-CM | POA: Diagnosis not present

## 2020-02-27 MED ORDER — SUTAB 1479-225-188 MG PO TABS
1.0000 | ORAL_TABLET | ORAL | 0 refills | Status: DC
Start: 2020-02-27 — End: 2020-03-06

## 2020-02-27 NOTE — Progress Notes (Addendum)
History of Present Illness: This is a 74 year old male referred by [name], DO for the evaluation of a positive Cologuard.  He has no ongoing gastrointestinal complaints and underwent Cologuard testing which was positive.  He states he has had prior negative Cologuard.  He states he underwent colonoscopy in [place], [place] in 2011 and that it was normal. Denies weight loss, abdominal pain, constipation, diarrhea, change in stool caliber, melena, hematochezia, nausea, vomiting, dysphagia, reflux symptoms, chest pain.    No Known Allergies Outpatient Medications Prior to Visit  Medication Sig Dispense Refill  . benazepril (LOTENSIN) 20 MG tablet Take 1 tablet (20 mg total) by mouth daily. 90 tablet 3  . dapagliflozin propanediol (FARXIGA) 10 MG TABS tablet Take 10 mg by mouth daily. 90 tablet 3  . Dulaglutide (TRULICITY) 1.5 MG/0.5ML SOPN Inject 0.5 mLs (1.5 mg total) into the skin once a week. 12 pen 3  . fenofibrate (TRICOR) 145 MG tablet Take 1 tablet (145 mg total) by mouth daily. 90 tablet 3  . Glucosamine 500 MG CAPS Take by mouth.    Marland Kitchen glucose blood (ONE TOUCH ULTRA TEST) test strip USE TO CHECK FOR BLOOD SUGAR THREE TIMES A DAY 300 each PRN  . ibuprofen (ADVIL) 600 MG tablet Take 1 tablet (600 mg total) by mouth every 8 (eight) hours as needed. 270 tablet 9  . Lancets (ONETOUCH DELICA PLUS LANCET33G) MISC USE TO CHECK BLOOD SUGAR THREE TIMES A DAY 300 each PRN  . LORazepam (ATIVAN) 0.5 MG tablet Take 1 tablet (0.5 mg total) by mouth daily as needed. 30 tablet 4  . meclizine (ANTIVERT) 25 MG tablet Take 1 tablet (25 mg total) by mouth 3 (three) times daily as needed for dizziness or nausea. 30 tablet 3  . MELATONIN PO Take by mouth.    . metFORMIN (GLUCOPHAGE-XR) 500 MG 24 hr tablet Take 2 tablets (1,000 mg total) by mouth 2 (two) times daily. 360 tablet 3  . montelukast (SINGULAIR) 10 MG tablet Take 1 tablet (10 mg total) by mouth at bedtime. 90 tablet 2  . Multiple  Vitamins-Minerals (MULTIVITAMIN PO) Take by mouth.    . Omega-3 Fatty Acids (FISH OIL PO) Take by mouth.    . pravastatin (PRAVACHOL) 80 MG tablet TAKE ONE TABLET BY MOUTH DAILY 30 tablet 0  . temazepam (RESTORIL) 30 MG capsule TAKE ONE CAPSULE BY MOUTH EVERY NIGHT AT BEDTIME AS NEEDED FOR SLEEP 30 capsule 5   No facility-administered medications prior to visit.   Past Medical History:  Diagnosis Date  . Diabetes mellitus without complication ([facility])   . Hypertension   . 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 CARPAL TUNNEL RELEASE    . CATARACT EXTRACTION    . NOSE SURGERY    . TOTAL KNEE ARTHROPLASTY    . UMBILICAL HERNIA REPAIR     Social History   Socioeconomic History  . Marital status: Married    Spouse name: Johnny Bridge  . Number of children: 1  . Years of education: 73  . Highest education level: Master's degree (e.g., MA, MS, MEng, MEd, MSW, MBA)  Occupational History  . Occupation: Clinical research associate    Comment: retired  Tobacco Use  . Smoking status: Former Games developer  . Smokeless tobacco: Never Used  Vaping Use  . Vaping Use: Never used  Substance and Sexual Activity  . Alcohol use: Yes    Alcohol/week:  1.0 standard drink    Types: 1 Cans of beer per week    Comment: occasionally  . Drug use: No  . Sexual activity: Yes  Other Topics Concern  . Not on file  Social History Narrative   Walks his dog for 1 hour every day. Goes to the gym as well and strength training and aerobic exercise.. Caffeine use daily   Social Determinants of Health   Financial Resource Strain:   . Difficulty of Paying Living Expenses:   Food Insecurity:   . Worried About Programme researcher, broadcasting/film/video in the Last Year:   . Barista in the Last Year:   Transportation Needs:   . Freight forwarder (Medical):   Marland Kitchen Lack of Transportation (Non-Medical):   Physical Activity:   . Days of Exercise per Week:   .  Minutes of Exercise per Session:   Stress:   . Feeling of Stress :   Social Connections:   . Frequency of Communication with Friends and Family:   . Frequency of Social Gatherings with Friends and Family:   . Attends Religious Services:   . Active Member of Clubs or Organizations:   . Attends Banker Meetings:   Marland Kitchen Marital Status:    Family History  Problem Relation Age of Onset  . Heart disease Mother   . Hypertension Mother   . Hypertension Brother   . Depression Brother   . Parkinson's disease Father   . Colon cancer Neg Hx   . Stomach cancer Neg Hx   . Rectal cancer Neg Hx   . Pancreatic cancer Neg Hx   . Esophageal cancer Neg Hx      Review of Systems: Pertinent positive and negative review of systems were noted in the above HPI section. All other review of systems were otherwise negative.   Physical Exam: General: Well developed, well nourished, no acute distress Head: Normocephalic and atraumatic Eyes:  sclerae anicteric, EOMI Ears: Normal auditory acuity Mouth: Not examined, mask on during Covid-19 pandemic Neck: Supple, no masses or thyromegaly Lungs: Clear throughout to auscultation Heart: Regular rate and rhythm; no murmurs, rubs or bruits Abdomen: Soft, non tender and non distended. No masses, hepatosplenomegaly or hernias noted. Normal Bowel sounds Rectal: Deferred to colonoscopy Musculoskeletal: Symmetrical with no gross deformities  Skin: No lesions on visible extremities Pulses:  Normal pulses noted Extremities: No clubbing, cyanosis, edema or deformities noted Neurological: Alert oriented x 4, grossly nonfocal Cervical Nodes:  No significant cervical adenopathy Inguinal Nodes: No significant inguinal adenopathy Psychological:  Alert and cooperative. Normal mood and affect   Assessment and Recommendations:  1.  Positive Cologuard.  Rule out colorectal neoplasms.  Schedule colonoscopy. The risks (including bleeding, perforation, infection,  missed lesions, medication reactions and possible hospitalization or surgery if complications occur), benefits, and alternatives to colonoscopy with possible biopsy and possible polypectomy were discussed with the patient and they consent to proceed.      cc: [name], DO [address] [facility] [address] [place],  [place] [address]

## 2020-02-27 NOTE — Patient Instructions (Signed)
You have been scheduled for a colonoscopy. Please follow written instructions given to you at your visit today.  Please pick up your prep supplies at the pharmacy within the next 1-3 days. If you use inhalers (even only as needed), please bring them with you on the day of your procedure.  Thank you for choosing me and [facility].  Malcolm T. Stark, Jr., MD., FACG  

## 2020-03-06 ENCOUNTER — Encounter: Payer: Self-pay | Admitting: Gastroenterology

## 2020-03-06 ENCOUNTER — Ambulatory Visit (AMBULATORY_SURGERY_CENTER): Payer: Medicare Other | Admitting: Gastroenterology

## 2020-03-06 ENCOUNTER — Other Ambulatory Visit: Payer: Self-pay

## 2020-03-06 VITALS — BP 120/68 | HR 73 | Temp 98.4°F | Resp 19 | Ht 75.0 in | Wt 230.0 lb

## 2020-03-06 DIAGNOSIS — K640 First degree hemorrhoids: Secondary | ICD-10-CM

## 2020-03-06 DIAGNOSIS — R195 Other fecal abnormalities: Secondary | ICD-10-CM

## 2020-03-06 DIAGNOSIS — Z1211 Encounter for screening for malignant neoplasm of colon: Secondary | ICD-10-CM | POA: Diagnosis not present

## 2020-03-06 MED ORDER — SODIUM CHLORIDE 0.9 % IV SOLN: 500.0000 mL | Freq: Once | INTRAVENOUS | Status: DC

## 2020-03-06 NOTE — Progress Notes (Signed)
To Pacu, VSS. Report to RN.tb 

## 2020-03-06 NOTE — Progress Notes (Signed)
Cw vitals and SH IV 

## 2020-03-06 NOTE — Op Note (Signed)
[facility] Patient Name: Renee Beale Procedure Date: 03/06/2020 8:38 AM MRN: [id number] Endoscopist: Meryl Dare , MD Age: 74 Referring MD:  Date of Birth: 23-Apr-1946 Gender: Male Account #: [id number] Procedure:                Colonoscopy Indications:              Positive Cologuard test Medicines:                Monitored Anesthesia Care Procedure:                Pre-Anesthesia Assessment:                           - Prior to the procedure, a History and Physical                            was performed, and patient medications and                            allergies were reviewed. The patient's tolerance of                            previous anesthesia was also reviewed. The risks                            and benefits of the procedure and the sedation                            options and risks were discussed with the patient.                            All questions were answered, and informed consent                            was obtained. Prior Anticoagulants: The patient has                            taken no previous anticoagulant or antiplatelet                            agents. ASA Grade Assessment: III - A patient with                            severe systemic disease. After reviewing the risks                            and benefits, the patient was deemed in                            satisfactory condition to undergo the procedure.                           After obtaining informed consent, the colonoscope  was passed under direct vision. Throughout the                            procedure, the patient's blood pressure, pulse, and                            oxygen saturations were monitored continuously. The                            Colonoscope was introduced through the anus and                            advanced to the the cecum, identified by                            appendiceal orifice and ileocecal valve.  The                            ileocecal valve, appendiceal orifice, and rectum                            were photographed. The quality of the bowel                            preparation was adequate after extensive lavage and                            suction. The colonoscopy was performed without                            difficulty. The patient tolerated the procedure                            well. Scope In: 8:44:13 AM Scope Out: 9:03:43 AM Scope Withdrawal Time: 0 hours 16 minutes 14 seconds  Total Procedure Duration: 0 hours 19 minutes 30 seconds  Findings:                 The perianal and digital rectal examinations were                            normal.                           Internal hemorrhoids were found during                            retroflexion. The hemorrhoids were small and Grade                            I (internal hemorrhoids that do not prolapse).                           The exam was otherwise without abnormality on  direct and retroflexion views. Complications:            No immediate complications. Estimated blood loss:                            None. Estimated Blood Loss:     Estimated blood loss: none. Impression:               - Internal hemorrhoids.                           - The examination was otherwise normal on direct                            and retroflexion views.                           - No specimens collected. Recommendation:           - Patient has a contact number available for                            emergencies. The signs and symptoms of potential                            delayed complications were discussed with the                            patient. Return to normal activities tomorrow.                            Written discharge instructions were provided to the                            patient.                           - Resume previous diet.                           - Continue  present medications.                           - No repeat screening colonoscopy due to age. Meryl Dare, MD 03/06/2020 9:07:02 AM This report has been signed electronically.

## 2020-03-06 NOTE — Patient Instructions (Addendum)
Handouts given:  Hemorrhoids NO polyps  Resume previous diet  continue present medications No Need to repeat colonoscopy   YOU HAD AN ENDOSCOPIC PROCEDURE TODAY AT THE [facility]:   Refer to the procedure report that was given to you for any specific questions about what was found during the examination.  If the procedure report does not answer your questions, please call your gastroenterologist to clarify.  If you requested that your care partner not be given the details of your procedure findings, then the procedure report has been included in a sealed envelope for you to review at your convenience later.  YOU SHOULD EXPECT: Some feelings of bloating in the abdomen. Passage of more gas than usual.  Walking can help get rid of the air that was put into your GI tract during the procedure and reduce the bloating. If you had a lower endoscopy (such as a colonoscopy or flexible sigmoidoscopy) you may notice spotting of blood in your stool or on the toilet paper. If you underwent a bowel prep for your procedure, you may not have a normal bowel movement for a few days.  Please Note:  You might notice some irritation and congestion in your nose or some drainage.  This is from the oxygen used during your procedure.  There is no need for concern and it should clear up in a day or so.  SYMPTOMS TO REPORT IMMEDIATELY:   Following lower endoscopy (colonoscopy or flexible sigmoidoscopy):  Excessive amounts of blood in the stool  Significant tenderness or worsening of abdominal pains  Swelling of the abdomen that is new, acute  Fever of 100F or higher   For urgent or emergent issues, a gastroenterologist can be reached at any hour by calling [phone]. Do not use MyChart messaging for urgent concerns.    DIET:  We do recommend a small meal at first, but then you may proceed to your regular diet.  Drink plenty of fluids but you should avoid alcoholic beverages for 24  hours.  ACTIVITY:  You should plan to take it easy for the rest of today and you should NOT DRIVE or use heavy machinery until tomorrow (because of the sedation medicines used during the test).    FOLLOW UP: Our staff will call the number listed on your records 48-72 hours following your procedure to check on you and address any questions or concerns that you may have regarding the information given to you following your procedure. If we do not reach you, we will leave a message.  We will attempt to reach you two times.  During this call, we will ask if you have developed any symptoms of COVID 19. If you develop any symptoms (ie: fever, flu-like symptoms, shortness of breath, cough etc.) before then, please call [phone].  If you test positive for Covid 19 in the 2 weeks post procedure, please call and report this information to [place].    If any biopsies were taken you will be contacted by phone or by letter within the next 1-3 weeks.  Please call us at [phone] if you have not heard about the biopsies in 3 weeks.    SIGNATURES/CONFIDENTIALITY: You and/or your care partner have signed paperwork which will be entered into your electronic medical record.  These signatures attest to the fact that that the information above on your After Visit Summary has been reviewed and is understood.  Full responsibility of the confidentiality of this discharge information lies with you  and/or your care-partner. 

## 2020-03-08 ENCOUNTER — Telehealth: Payer: Self-pay | Admitting: *Deleted

## 2020-03-08 NOTE — Telephone Encounter (Signed)
Follow up call made. 

## 2020-03-08 NOTE — Telephone Encounter (Signed)
No answer-- unable to leave message.

## 2020-04-04 ENCOUNTER — Other Ambulatory Visit: Payer: Self-pay | Admitting: Family Medicine

## 2020-04-04 DIAGNOSIS — E1142 Type 2 diabetes mellitus with diabetic polyneuropathy: Secondary | ICD-10-CM

## 2020-04-11 ENCOUNTER — Other Ambulatory Visit: Payer: Self-pay | Admitting: Family Medicine

## 2020-04-14 DIAGNOSIS — Z23 Encounter for immunization: Secondary | ICD-10-CM | POA: Diagnosis not present

## 2020-04-25 ENCOUNTER — Other Ambulatory Visit: Payer: Self-pay | Admitting: Family Medicine

## 2020-05-06 ENCOUNTER — Encounter: Payer: Self-pay | Admitting: Family Medicine

## 2020-06-03 DIAGNOSIS — Z23 Encounter for immunization: Secondary | ICD-10-CM | POA: Diagnosis not present

## 2020-06-20 ENCOUNTER — Other Ambulatory Visit: Payer: Self-pay | Admitting: Family Medicine

## 2020-06-21 ENCOUNTER — Other Ambulatory Visit: Payer: Self-pay | Admitting: Family Medicine

## 2020-06-26 DIAGNOSIS — Z961 Presence of intraocular lens: Secondary | ICD-10-CM | POA: Diagnosis not present

## 2020-06-26 DIAGNOSIS — I10 Essential (primary) hypertension: Secondary | ICD-10-CM | POA: Diagnosis not present

## 2020-06-26 DIAGNOSIS — H35033 Hypertensive retinopathy, bilateral: Secondary | ICD-10-CM | POA: Diagnosis not present

## 2020-06-26 DIAGNOSIS — Z9849 Cataract extraction status, unspecified eye: Secondary | ICD-10-CM | POA: Diagnosis not present

## 2020-06-26 DIAGNOSIS — H5213 Myopia, bilateral: Secondary | ICD-10-CM | POA: Diagnosis not present

## 2020-06-26 DIAGNOSIS — H52223 Regular astigmatism, bilateral: Secondary | ICD-10-CM | POA: Diagnosis not present

## 2020-06-26 DIAGNOSIS — H524 Presbyopia: Secondary | ICD-10-CM | POA: Diagnosis not present

## 2020-06-26 DIAGNOSIS — E119 Type 2 diabetes mellitus without complications: Secondary | ICD-10-CM | POA: Diagnosis not present

## 2020-07-11 ENCOUNTER — Encounter: Payer: Self-pay | Admitting: Family Medicine

## 2020-07-11 ENCOUNTER — Other Ambulatory Visit: Payer: Self-pay

## 2020-07-11 ENCOUNTER — Ambulatory Visit (INDEPENDENT_AMBULATORY_CARE_PROVIDER_SITE_OTHER): Payer: Medicare Other | Admitting: Family Medicine

## 2020-07-11 DIAGNOSIS — E1159 Type 2 diabetes mellitus with other circulatory complications: Secondary | ICD-10-CM

## 2020-07-11 DIAGNOSIS — G4700 Insomnia, unspecified: Secondary | ICD-10-CM | POA: Diagnosis not present

## 2020-07-11 DIAGNOSIS — J329 Chronic sinusitis, unspecified: Secondary | ICD-10-CM

## 2020-07-11 DIAGNOSIS — I152 Hypertension secondary to endocrine disorders: Secondary | ICD-10-CM | POA: Diagnosis not present

## 2020-07-11 DIAGNOSIS — E785 Hyperlipidemia, unspecified: Secondary | ICD-10-CM

## 2020-07-11 DIAGNOSIS — E1142 Type 2 diabetes mellitus with diabetic polyneuropathy: Secondary | ICD-10-CM

## 2020-07-11 DIAGNOSIS — F411 Generalized anxiety disorder: Secondary | ICD-10-CM

## 2020-07-11 LAB — POCT GLYCOSYLATED HEMOGLOBIN (HGB A1C): Hemoglobin A1C: 6.1 % — AB (ref 4.0–5.6)

## 2020-07-11 MED ORDER — LORAZEPAM 0.5 MG PO TABS
0.5000 mg | ORAL_TABLET | Freq: Every day | ORAL | 4 refills | Status: DC | PRN
Start: 2020-07-11 — End: 2020-12-05

## 2020-07-11 MED ORDER — PRAVASTATIN SODIUM 80 MG PO TABS: 80.0000 mg | ORAL_TABLET | Freq: Every day | ORAL | 3 refills | Status: DC

## 2020-07-11 MED ORDER — TEMAZEPAM 30 MG PO CAPS: ORAL_CAPSULE | ORAL | 5 refills | Status: DC

## 2020-07-11 NOTE — Patient Instructions (Signed)
Great to see you today! Please continue current medications.  See me again in about 6 months.  

## 2020-07-11 NOTE — Assessment & Plan Note (Signed)
This is improved significantly with addition of Singulair.

## 2020-07-11 NOTE — Assessment & Plan Note (Signed)
He is tolerating pravastatin well, we will plan to have him continue this.  Medication renewed.

## 2020-07-11 NOTE — Assessment & Plan Note (Signed)
He has had some increase in anxiety recently due to loss of his family pet.  Offered condolences regarding this.  He will continue lorazepam as needed during the day.  He uses this sparingly.

## 2020-07-11 NOTE — Assessment & Plan Note (Signed)
He has type 2 diabetes with associated neuropathy.  His blood sugars remain well controlled with current A1c of 6.1%.  He is not having any side effects from medication.  We will plan to continue at current strength and dosing.

## 2020-07-11 NOTE — Assessment & Plan Note (Signed)
Temazepam is working well for his chronic insomnia.  No oversedation or morning grogginess.  We will plan to continue at current strength.

## 2020-07-11 NOTE — Progress Notes (Signed)
Luis Fernandez - 75 y.o. male MRN [id number]  Date of birth: 1946-04-01  Subjective Chief Complaint  Patient presents with  . Diabetes    HPI Luis Fernandez is a 74 y.o. male here today for follow-up of type 2 diabetes, hypertension and anxiety with insomnia.  His hypertension is currently managed with benazepril 20 mg.  This has worked well for him and his blood pressure has been well controlled historically with this medication.  He denies any side effects related to benazepril.  He has not had any chest pain, shortness of breath, palpitations, headache or vision changes.  Diabetes is currently managed with combination of Farxiga, Trulicity and Metformin.  Blood sugars have remained well controlled with these medications.  He has not noticed any significant side effects related to current medications.  He denies symptoms of hypoglycemia.  He does have some neuropathy related to his diabetes, this is not painful but does affect his gait to some degree.  He denies any falls.  His associated hyperlipidemia is treated with pravastatin, tolerating well.  He continues to take lorazepam during the day as needed for anxiety as well as temazepam at bedtime.  He did recently lose his family dog and has felt a little more anxious recently.  ROS:  A comprehensive ROS was completed and negative except as noted per HPI  No Known Allergies  Past Medical History:  Diagnosis Date  . Diabetes mellitus without complication (HCC)   . Hypertension   . 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 CARPAL TUNNEL RELEASE    . CATARACT EXTRACTION    . NOSE SURGERY    . TOTAL KNEE ARTHROPLASTY    . UMBILICAL HERNIA REPAIR      Social History   Socioeconomic History  . Marital status: Married    Spouse name: Johnny Bridge  . Number of children: 1  . Years of education: 18  . Highest education level: Master's degree  (e.g., MA, MS, MEng, MEd, MSW, MBA)  Occupational History  . Occupation: Clinical research associate    Comment: retired  Tobacco Use  . Smoking status: Former Games developer  . Smokeless tobacco: Never Used  Vaping Use  . Vaping Use: Never used  Substance and Sexual Activity  . Alcohol use: Yes    Alcohol/week: 1.0 standard drink    Types: 1 Cans of beer per week    Comment: occasionally  . Drug use: No  . Sexual activity: Yes  Other Topics Concern  . Not on file  Social History Narrative   Walks his dog for 1 hour every day. Goes to the gym as well and strength training and aerobic exercise.. Caffeine use daily   Social Determinants of Health   Financial Resource Strain: Not on file  Food Insecurity: Not on file  Transportation Needs: Not on file  Physical Activity: Not on file  Stress: Not on file  Social Connections: Not on file    Family History  Problem Relation Age of Onset  . Heart disease Mother   . Hypertension Mother   . Hypertension Brother   . Depression Brother   . Parkinson's disease Father   . Colon cancer Neg Hx   . Stomach cancer Neg Hx   . Rectal cancer Neg Hx   . Pancreatic cancer Neg Hx   . Esophageal cancer Neg Hx     Health Maintenance  Topic Date Due  . COVID-19  Vaccine (3 - Booster for Moderna series) 04/03/2020  . OPHTHALMOLOGY EXAM  06/01/2020  . TETANUS/TDAP  09/26/2020 (Originally 03/05/2019)  . FOOT EXAM  01/09/2021  . HEMOGLOBIN A1C  01/09/2021  . Fecal DNA (Cologuard)  01/16/2023  . INFLUENZA VACCINE  Completed  . Hepatitis C Screening  Completed  . PNA vac Low Risk Adult  Completed     ----------------------------------------------------------------------------------------------------------------------------------------------------------------------------------------------------------------- Physical Exam BP 126/75 (BP Location: Left Arm, Patient Position: Sitting, Cuff Size: Normal)   Pulse 71   Wt 234 lb 1.6 oz (106.2 kg)   SpO2 98%   BMI 29.26  kg/m   Physical Exam Constitutional:      Appearance: Normal appearance.  HENT:     Head: Normocephalic and atraumatic.  Eyes:     General: No scleral icterus. Cardiovascular:     Rate and Rhythm: Normal rate and regular rhythm.  Pulmonary:     Effort: Pulmonary effort is normal.     Breath sounds: Normal breath sounds.  Neurological:     General: No focal deficit present.     Mental Status: He is alert.  Psychiatric:        Mood and Affect: Mood normal.        Behavior: Behavior normal.     ------------------------------------------------------------------------------------------------------------------------------------------------------------------------------------------------------------------- Assessment and Plan  Hypertension associated with diabetes (HCC) There is blood pressure readings well controlled with benazepril.  I recommend that he continue on current strength 20 mg daily.  He has been need refills of this medication.  Plan to repeat some labs at his next follow-up.  Recurrent sinusitis This is improved significantly with addition of Singulair.  Diabetes with neurologic complications [facility]) He has type 2 diabetes with associated neuropathy.  His blood sugars remain well controlled with current A1c of 6.1%.  He is not having any side effects from medication.  We will plan to continue at current strength and dosing.  Hyperlipidemia LDL goal <70 He is tolerating pravastatin well, we will plan to have him continue this.  Medication renewed.  Insomnia Temazepam is working well for his chronic insomnia.  No oversedation or morning grogginess.  We will plan to continue at current strength.  GAD (generalized anxiety disorder) He has had some increase in anxiety recently due to loss of his family pet.  Offered condolences regarding this.  He will continue lorazepam as needed during the day.  He uses this sparingly.   Meds ordered this encounter  Medications  .  LORazepam (ATIVAN) 0.5 MG tablet    Sig: Take 1 tablet (0.5 mg total) by mouth daily as needed.    Dispense:  30 tablet    Refill:  4  . temazepam (RESTORIL) 30 MG capsule    Sig: TAKE ONE CAPSULE BY MOUTH EVERY NIGHT AT BEDTIME AS NEEDED FOR SLEEP    Dispense:  30 capsule    Refill:  5  . pravastatin (PRAVACHOL) 80 MG tablet    Sig: Take 1 tablet (80 mg total) by mouth daily.    Dispense:  90 tablet    Refill:  3    Return in about 6 months (around 01/09/2021) for HTN/T2DM.    This visit occurred during the SARS-CoV-2 public health emergency.  Safety protocols were in place, including screening questions prior to the visit, additional usage of staff PPE, and extensive cleaning of exam room while observing appropriate contact time as indicated for disinfecting solutions.

## 2020-07-11 NOTE — Assessment & Plan Note (Signed)
There is blood pressure readings well controlled with benazepril.  I recommend that he continue on current strength 20 mg daily.  He has been need refills of this medication.  Plan to repeat some labs at his next follow-up.

## 2020-07-12 MED ORDER — SCOPOLAMINE 1 MG/3DAYS TD PT72: 1.0000 | MEDICATED_PATCH | TRANSDERMAL | 0 refills | Status: DC

## 2020-07-12 NOTE — Addendum Note (Signed)
Addended by: Mammie Lorenzo on: 07/12/2020 07:51 AM   Modules accepted: Orders

## 2020-08-01 DIAGNOSIS — H26491 Other secondary cataract, right eye: Secondary | ICD-10-CM | POA: Diagnosis not present

## 2020-08-01 DIAGNOSIS — Z961 Presence of intraocular lens: Secondary | ICD-10-CM | POA: Diagnosis not present

## 2020-08-01 LAB — HM DIABETES EYE EXAM

## 2020-08-12 ENCOUNTER — Encounter: Payer: Self-pay | Admitting: Family Medicine

## 2020-08-16 DIAGNOSIS — H5989 Other postprocedural complications and disorders of eye and adnexa, not elsewhere classified: Secondary | ICD-10-CM | POA: Diagnosis not present

## 2020-08-25 IMAGING — CT CT MAXILLOFACIAL W/O CM
1 series · 16 of 30 positions shown, 20 images · non-contrast
Comparison: None.

CLINICAL DATA: Chronic or recurrent sinusitis.

EXAM:
CT MAXILLOFACIAL WITHOUT CONTRAST
TECHNIQUE: Multidetector CT images of the paranasal sinuses were obtained using
the standard protocol without intravenous contrast.

[Series 4: soft tissue · axial · 0.38mm/px · z∈[-132,-19]mm · 16 of 123 slices shown, 20 images]
[im 5/123  brain]
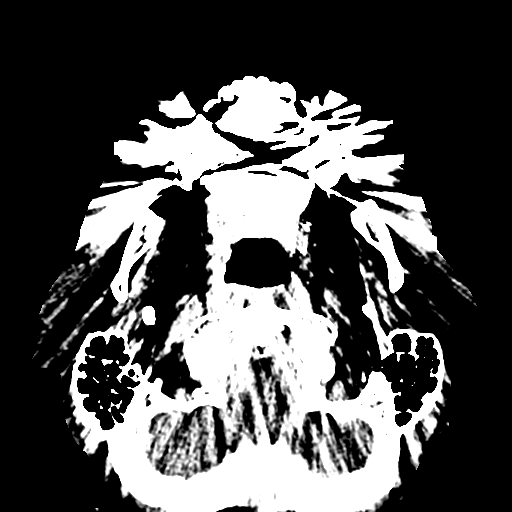
[im 5/123  bone]
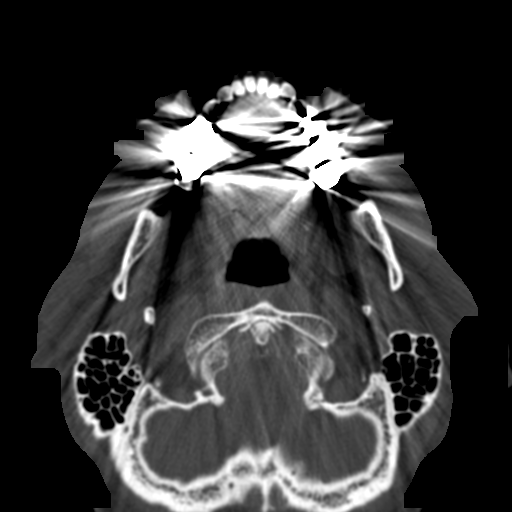
[im 13/123  bone]
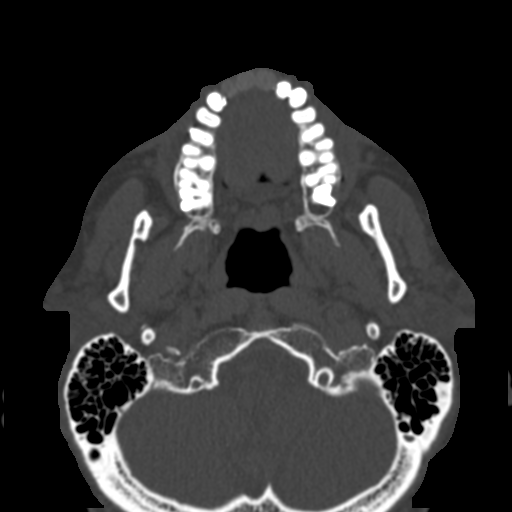
[im 22/123  bone]
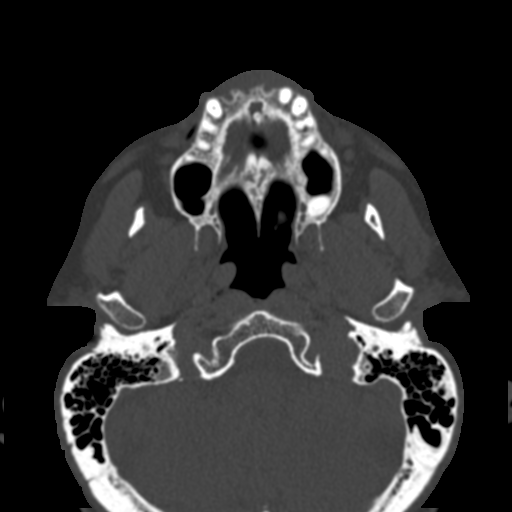
[im 30/123  bone]
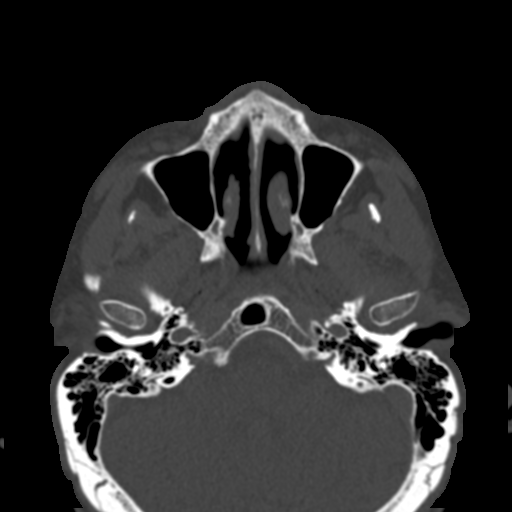
[im 34/123  brain]
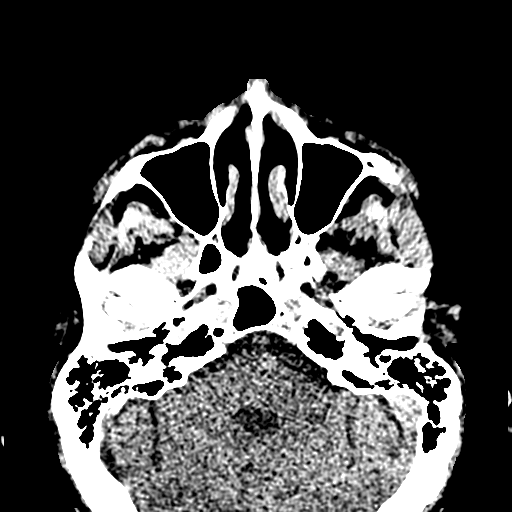
[im 34/123  bone]
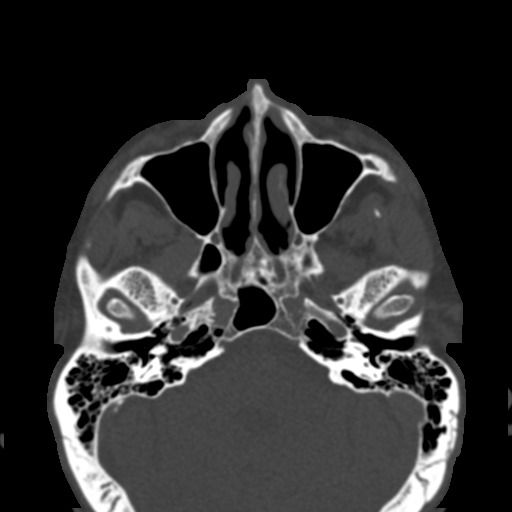
[im 43/123  bone]
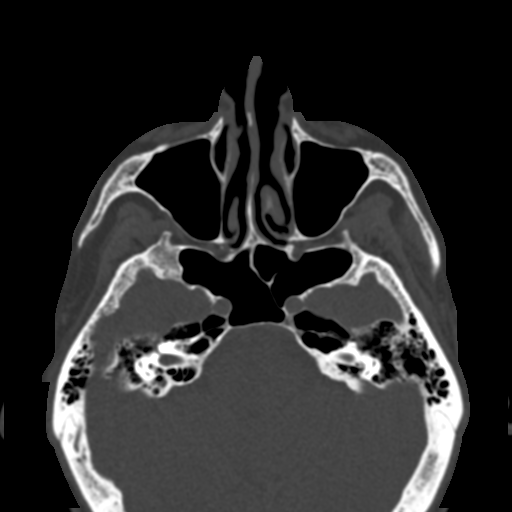
[im 51/123  bone]
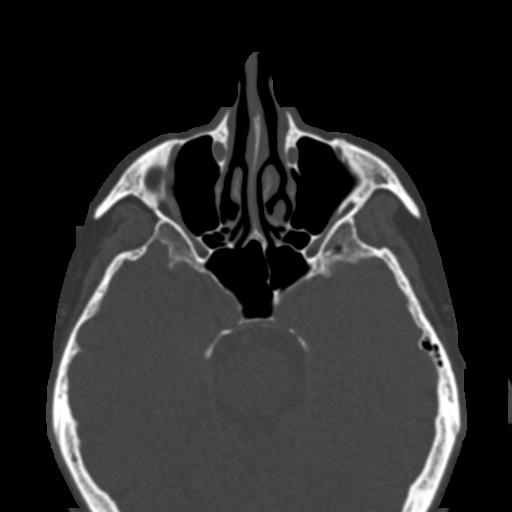
[im 59/123  bone]
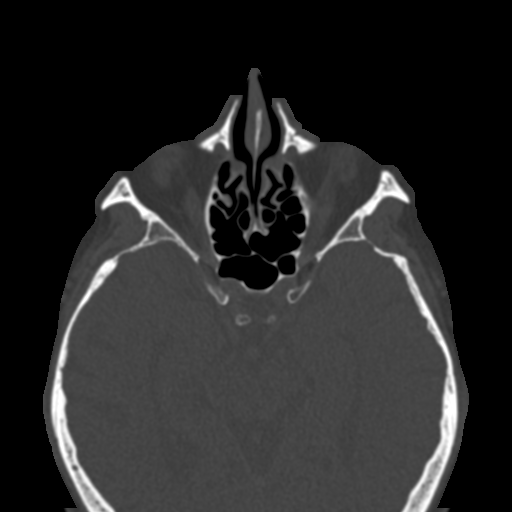
[im 64/123  brain]
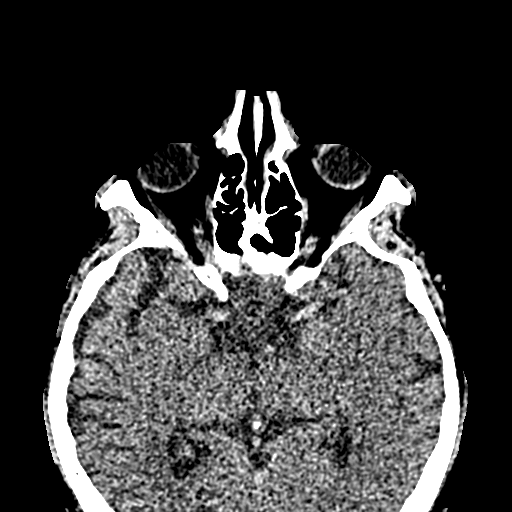
[im 64/123  bone]
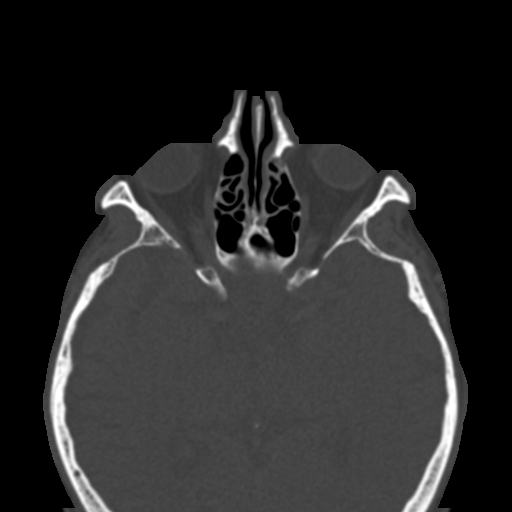
[im 72/123  bone]
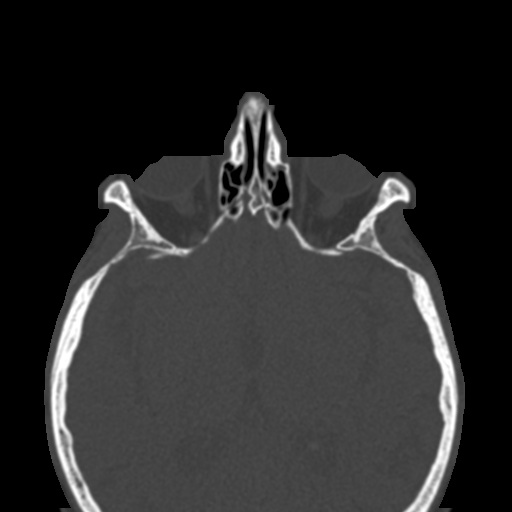
[im 80/123  bone]
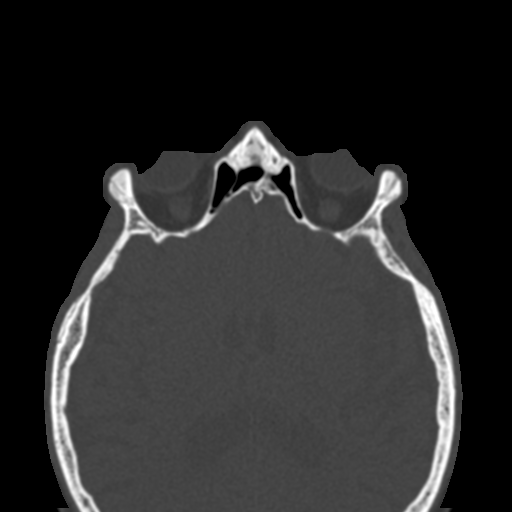
[im 89/123  bone]
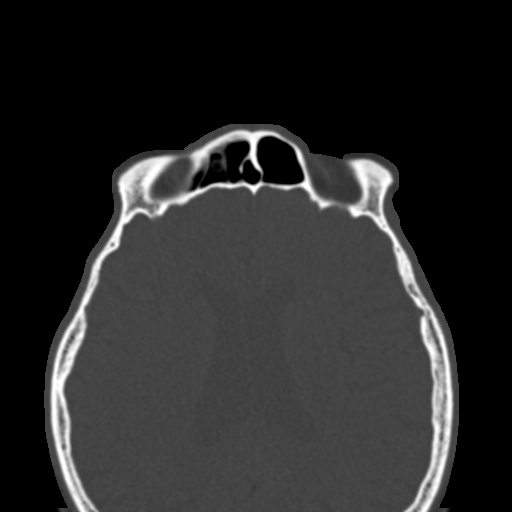
[im 93/123  brain]
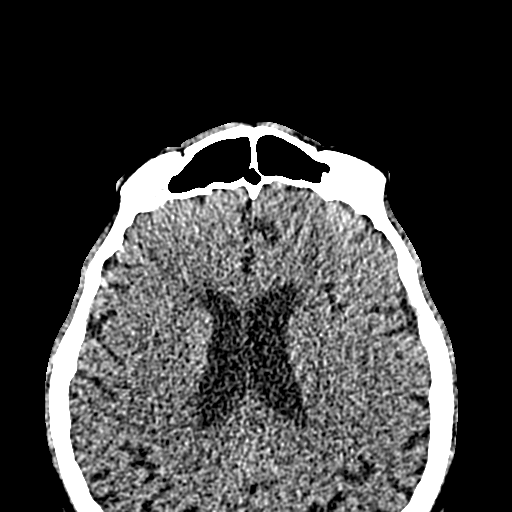
[im 93/123  bone]
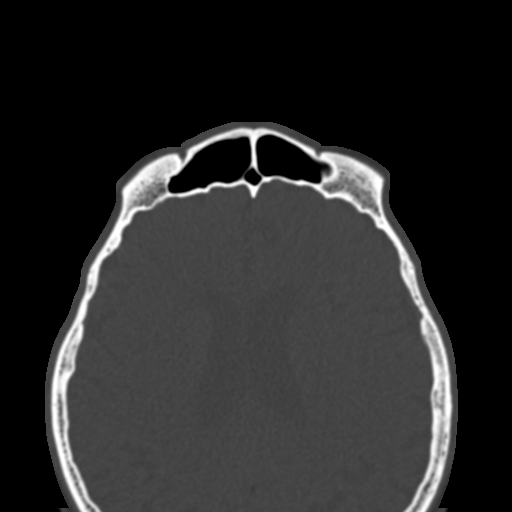
[im 101/123  bone]
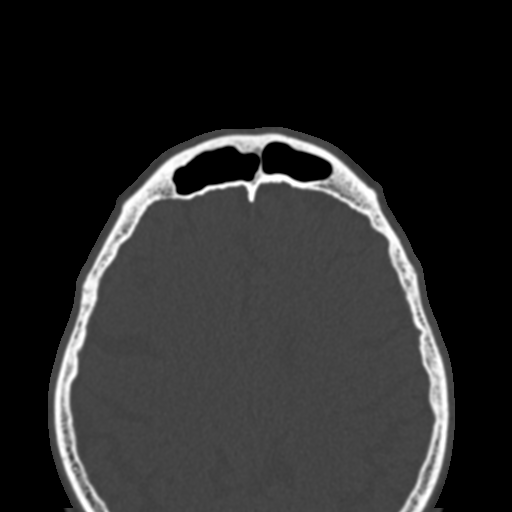
[im 110/123  bone]
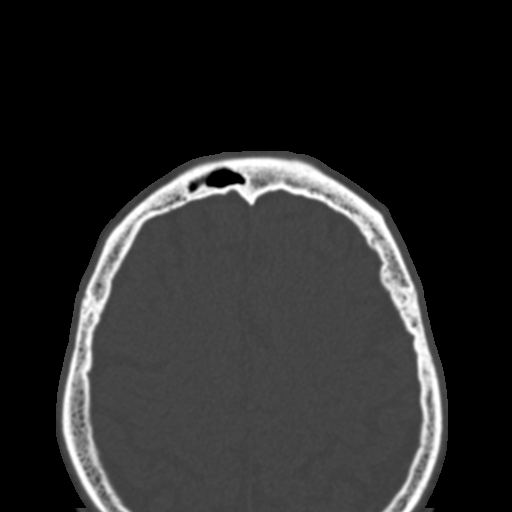
[im 118/123  bone]
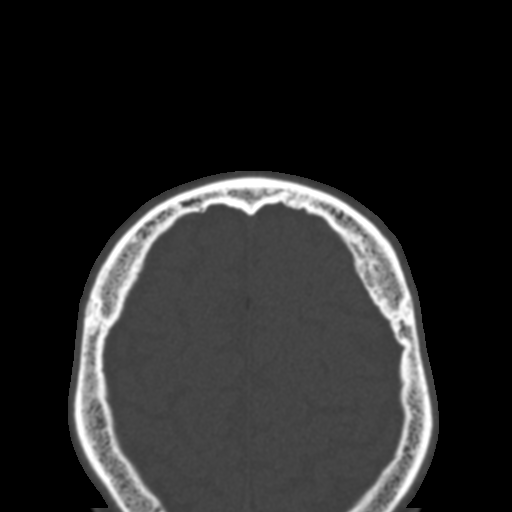

[16 of 30 positions shown; findings below may reference images not displayed]

FINDINGS: Paranasal sinuses:

Frontal: Normally aerated. Patent frontal sinus drainage pathways.

Ethmoid: Normally aerated.

Maxillary: Normally aerated.

Sphenoid: Normally aerated. Patent sphenoethmoidal recesses.

Right ostiomeatal unit: Patent.

Left ostiomeatal unit: Patent.

Nasal passages: Patent. The anterior nasal septum is mildly deviated
to the right.

Other: No significant intracranial or orbital finding.
IMPRESSION: Negative for sinusitis or sinus outflow obstruction.

## 2020-09-28 ENCOUNTER — Other Ambulatory Visit: Payer: Self-pay | Admitting: Family Medicine

## 2020-11-15 DIAGNOSIS — Z23 Encounter for immunization: Secondary | ICD-10-CM | POA: Diagnosis not present

## 2020-12-05 ENCOUNTER — Other Ambulatory Visit: Payer: Self-pay | Admitting: Family Medicine

## 2020-12-12 ENCOUNTER — Other Ambulatory Visit: Payer: Self-pay | Admitting: Family Medicine

## 2021-01-09 ENCOUNTER — Other Ambulatory Visit: Payer: Self-pay

## 2021-01-09 ENCOUNTER — Encounter: Payer: Self-pay | Admitting: Family Medicine

## 2021-01-09 ENCOUNTER — Ambulatory Visit (INDEPENDENT_AMBULATORY_CARE_PROVIDER_SITE_OTHER): Payer: Medicare Other | Admitting: Family Medicine

## 2021-01-09 ENCOUNTER — Ambulatory Visit (INDEPENDENT_AMBULATORY_CARE_PROVIDER_SITE_OTHER): Payer: Medicare Other

## 2021-01-09 VITALS — BP 140/78 | HR 81 | Temp 96.8°F | Ht 75.0 in | Wt 231.0 lb

## 2021-01-09 DIAGNOSIS — M25551 Pain in right hip: Secondary | ICD-10-CM

## 2021-01-09 DIAGNOSIS — E785 Hyperlipidemia, unspecified: Secondary | ICD-10-CM

## 2021-01-09 DIAGNOSIS — M25552 Pain in left hip: Secondary | ICD-10-CM

## 2021-01-09 DIAGNOSIS — R202 Paresthesia of skin: Secondary | ICD-10-CM

## 2021-01-09 DIAGNOSIS — I152 Hypertension secondary to endocrine disorders: Secondary | ICD-10-CM

## 2021-01-09 DIAGNOSIS — M1612 Unilateral primary osteoarthritis, left hip: Secondary | ICD-10-CM | POA: Diagnosis not present

## 2021-01-09 DIAGNOSIS — G4733 Obstructive sleep apnea (adult) (pediatric): Secondary | ICD-10-CM | POA: Diagnosis not present

## 2021-01-09 DIAGNOSIS — E1142 Type 2 diabetes mellitus with diabetic polyneuropathy: Secondary | ICD-10-CM

## 2021-01-09 DIAGNOSIS — E1159 Type 2 diabetes mellitus with other circulatory complications: Secondary | ICD-10-CM | POA: Diagnosis not present

## 2021-01-09 DIAGNOSIS — M1611 Unilateral primary osteoarthritis, right hip: Secondary | ICD-10-CM | POA: Diagnosis not present

## 2021-01-09 DIAGNOSIS — E538 Deficiency of other specified B group vitamins: Secondary | ICD-10-CM | POA: Diagnosis not present

## 2021-01-09 MED ORDER — LOSARTAN POTASSIUM 50 MG PO TABS: 50.0000 mg | ORAL_TABLET | Freq: Every day | ORAL | 1 refills | Status: DC

## 2021-01-09 MED ORDER — METFORMIN HCL ER 500 MG PO TB24: 1000.0000 mg | ORAL_TABLET | Freq: Two times a day (BID) | ORAL | 3 refills | Status: DC

## 2021-01-09 MED ORDER — DAPAGLIFLOZIN PROPANEDIOL 10 MG PO TABS: 10.0000 mg | ORAL_TABLET | Freq: Every day | ORAL | 2 refills | Status: DC

## 2021-01-09 MED ORDER — TRULICITY 1.5 MG/0.5ML ~~LOC~~ SOAJ: 1.5000 mg | SUBCUTANEOUS | 2 refills | Status: DC

## 2021-01-09 NOTE — Patient Instructions (Signed)
Let's try changing benazepril to losartan and see if cough improves.  I have placed referral to sleep specialist.  We'll be in touch with lab and xray results.  See me again in 6 months.

## 2021-01-10 LAB — CBC WITH DIFFERENTIAL/PLATELET
Absolute Monocytes: 426 cells/uL (ref 200–950)
Basophils Absolute: 31 cells/uL (ref 0–200)
Basophils Relative: 0.6 %
Eosinophils Absolute: 182 cells/uL (ref 15–500)
Eosinophils Relative: 3.5 %
HCT: 49.5 % (ref 38.5–50.0)
Hemoglobin: 17 g/dL (ref 13.2–17.1)
Lymphs Abs: 1799 cells/uL (ref 850–3900)
MCH: 32.4 pg (ref 27.0–33.0)
MCHC: 34.3 g/dL (ref 32.0–36.0)
MCV: 94.5 fL (ref 80.0–100.0)
MPV: 9.4 fL (ref 7.5–12.5)
Monocytes Relative: 8.2 %
Neutro Abs: 2761 cells/uL (ref 1500–7800)
Neutrophils Relative %: 53.1 %
Platelets: 258 10*3/uL (ref 140–400)
RBC: 5.24 10*6/uL (ref 4.20–5.80)
RDW: 12.5 % (ref 11.0–15.0)
Total Lymphocyte: 34.6 %
WBC: 5.2 10*3/uL (ref 3.8–10.8)

## 2021-01-10 LAB — COMPLETE METABOLIC PANEL WITH GFR
AG Ratio: 2.1 (calc) (ref 1.0–2.5)
ALT: 29 U/L (ref 9–46)
AST: 29 U/L (ref 10–35)
Albumin: 4.8 g/dL (ref 3.6–5.1)
Alkaline phosphatase (APISO): 43 U/L (ref 35–144)
BUN: 23 mg/dL (ref 7–25)
CO2: 26 mmol/L (ref 20–32)
Calcium: 10.3 mg/dL (ref 8.6–10.3)
Chloride: 106 mmol/L (ref 98–110)
Creat: 0.94 mg/dL (ref 0.70–1.18)
GFR, Est African American: 92 mL/min/{1.73_m2} (ref >=60)
GFR, Est Non African American: 79 mL/min/{1.73_m2} (ref >=60)
Globulin: 2.3 g/dL (calc) (ref 1.9–3.7)
Glucose, Bld: 120 mg/dL — ABNORMAL HIGH (ref 65–99)
Potassium: 4.6 mmol/L (ref 3.5–5.3)
Sodium: 141 mmol/L (ref 135–146)
Total Bilirubin: 0.7 mg/dL (ref 0.2–1.2)
Total Protein: 7.1 g/dL (ref 6.1–8.1)

## 2021-01-10 LAB — HEMOGLOBIN A1C
Hgb A1c MFr Bld: 6.1 % of total Hgb — ABNORMAL HIGH (ref <5.7)
Mean Plasma Glucose: 128 mg/dL
eAG (mmol/L): 7.1 mmol/L

## 2021-01-10 LAB — LIPID PANEL W/REFLEX DIRECT LDL
Cholesterol: 157 mg/dL (ref <200)
HDL: 33 mg/dL — ABNORMAL LOW (ref >=40)
LDL Cholesterol (Calc): 89 mg/dL (calc)
Non-HDL Cholesterol (Calc): 124 mg/dL (calc) (ref <130)
Total CHOL/HDL Ratio: 4.8 (calc) (ref <5.0)
Triglycerides: 265 mg/dL — ABNORMAL HIGH (ref <150)

## 2021-01-10 LAB — VITAMIN B12: Vitamin B-12: 586 pg/mL (ref 200–1100)

## 2021-01-12 DIAGNOSIS — M25552 Pain in left hip: Secondary | ICD-10-CM | POA: Insufficient documentation

## 2021-01-12 NOTE — Assessment & Plan Note (Signed)
BP remains fairly well controlled with current medications.  Readings at home have been well controlled.  Continue current medications and low sodium diet recommended.

## 2021-01-12 NOTE — Assessment & Plan Note (Signed)
Most recent A1c of  Lab Results  Component Value Date   HGBA1C 6.1 (H) 01/09/2021   indicates diabetes is well controlled.  he  will continue current medications.  Counseled on healthy, low carb diet and recommend frequent activity to help with maintaining good control of blood sugars.

## 2021-01-12 NOTE — Assessment & Plan Note (Signed)
X-rays of bilateral hips ordered. °

## 2021-01-12 NOTE — Assessment & Plan Note (Signed)
Referral back to sleep specialist.

## 2021-01-12 NOTE — Progress Notes (Signed)
Luis Fernandez - 75 y.o. male MRN [id number]  Date of birth: 1946/02/25  Subjective Chief Complaint  Patient presents with   Hypertension   Diabetes    HPI Luis Fernandez is a 75 y.o. male here today for follow up visit.  He reports that he is doing well.  Has history of OSA and requests follow up with sleep medicine to be sure he doesn't need re-titration as he has had a little difficulty with sleep.   He is also having some increased bilateral hip pain.  Doesn't recall any recent injury or overuse. Started after sitting for a period of time Painful to walk and squat to sit.  No radiation of pain, numbness or tingling.    Reports that blood sugars continue to look pretty good at home.  No side effects or symptoms of hypoglycemia from current medications.  Weight is down a few pounds since last visit. He is taking pravastatin for associated HLD.    HTN has been well controlled with current medications.  No side effects.  Denies chest pain, shortness of breath, palpitations, headache or vision changes.      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   On medical reccords     Past Surgical History:  Procedure Laterality Date   CARPAL TUNNEL RELEASE     CATARACT EXTRACTION     NOSE SURGERY     TOTAL KNEE ARTHROPLASTY     UMBILICAL HERNIA REPAIR      Social History   Socioeconomic History   Marital status: Married    Spouse name: Johnny Bridge   Number of children: 1   Years of education: 19   Highest education level: Master's degree (e.g., MA, MS, MEng, MEd, MSW, MBA)  Occupational History   Occupation: Clinical research associate    Comment: retired  Tobacco Use   Smoking status: Former    Pack years: 0.00   Smokeless tobacco: Never  Vaping Use   Vaping Use: Never used   Substance and Sexual Activity   Alcohol use: Yes    Alcohol/week: 1.0 standard drink    Types: 1 Cans of beer per week    Comment: occasionally   Drug use: No   Sexual activity: Yes  Other Topics Concern   Not on file  Social History Narrative   Walks his dog for 1 hour every day. Goes to the gym as well and strength training and aerobic exercise.. Caffeine use daily   Social Determinants of Health   Financial Resource Strain: Not on file  Food Insecurity: Not on file  Transportation Needs: Not on file  Physical Activity: Not on file  Stress: Not on file  Social Connections: Not on file    Family History  Problem Relation Age of Onset   Heart disease Mother    Hypertension Mother    Hypertension Brother    Depression Brother    Parkinson's disease Father    Colon cancer Neg Hx    Stomach cancer Neg Hx    Rectal cancer Neg Hx    Pancreatic cancer Neg Hx    Esophageal cancer Neg Hx     Health Maintenance  Topic Date Due   TETANUS/TDAP  03/05/2019   FOOT EXAM  01/09/2021   INFLUENZA VACCINE  03/03/2021  HEMOGLOBIN A1C  07/11/2021   OPHTHALMOLOGY EXAM  08/01/2021   Fecal DNA (Cologuard)  01/16/2023   COVID-19 Vaccine  Completed   Hepatitis C Screening  Completed   PNA vac Low Risk Adult  Completed   Zoster Vaccines- Shingrix  Completed   HPV VACCINES  Aged Out     ----------------------------------------------------------------------------------------------------------------------------------------------------------------------------------------------------------------- Physical Exam BP 140/78 (BP Location: Left Arm, Patient Position: Sitting, Cuff Size: Normal)   Pulse 81   Temp (!) 96.8 F (36 C)   Ht 6\' 3"  (1.905 m)   Wt 231 lb (104.8 kg)   SpO2 97%   BMI 28.87 kg/m   Physical Exam Constitutional:      Appearance: Normal appearance.  HENT:     Head: Normocephalic and atraumatic.  Eyes:     General: No scleral icterus. Cardiovascular:     Rate  and Rhythm: Normal rate and regular rhythm.  Pulmonary:     Effort: Pulmonary effort is normal.     Breath sounds: Normal breath sounds.  Musculoskeletal:        General: No signs of injury.     Comments: ROM is painful and limited with internal and external rotation bilaterally.  Pain with faber and fadir.    Neurological:     Mental Status: He is alert.  Psychiatric:        Mood and Affect: Mood normal.        Behavior: Behavior normal.    ------------------------------------------------------------------------------------------------------------------------------------------------------------------------------------------------------------------- Assessment and Plan  Hypertension associated with diabetes (HCC) BP remains fairly well controlled with current medications.  Readings at home have been well controlled.  Continue current medications and low sodium diet recommended.    OSA (obstructive sleep apnea) Referral back to sleep specialist.   Diabetes with neurologic complications (HCC) Most recent A1c of  Lab Results  Component Value Date   HGBA1C 6.1 (H) 01/09/2021   indicates diabetes is well controlled.  he  will continue current medications.  Counseled on healthy, low carb diet and recommend frequent activity to help with maintaining good control of blood sugars.    Bilateral hip pain Xrays of bilateral hips ordered.     Meds ordered this encounter  Medications   losartan (COZAAR) 50 MG tablet    Sig: Take 1 tablet (50 mg total) by mouth daily.    Dispense:  90 tablet    Refill:  1   metFORMIN (GLUCOPHAGE-XR) 500 MG 24 hr tablet    Sig: Take 2 tablets (1,000 mg total) by mouth 2 (two) times daily.    Dispense:  360 tablet    Refill:  3   dapagliflozin propanediol (FARXIGA) 10 MG TABS tablet    Sig: Take 1 tablet (10 mg total) by mouth daily.    Dispense:  90 tablet    Refill:  2   Dulaglutide (TRULICITY) 1.5 MG/0.5ML SOPN    Sig: Inject 1.5 mg into the skin  once a week.    Dispense:  2 mL    Refill:  2    Return in about 6 months (around 07/11/2021) for htn/dm.    This visit occurred during the SARS-CoV-2 public health emergency.  Safety protocols were in place, including screening questions prior to the visit, additional usage of staff PPE, and extensive cleaning of exam room while observing appropriate contact time as indicated for disinfecting solutions.

## 2021-01-13 ENCOUNTER — Encounter: Payer: Self-pay | Admitting: Family Medicine

## 2021-01-14 ENCOUNTER — Other Ambulatory Visit: Payer: Self-pay | Admitting: Family Medicine

## 2021-01-14 DIAGNOSIS — M25559 Pain in unspecified hip: Secondary | ICD-10-CM

## 2021-01-14 NOTE — Telephone Encounter (Signed)
Referral to Dr. Corey placed 

## 2021-01-16 NOTE — Progress Notes (Signed)
Subjective:    CC: B hip pain  I, Molly Weber, LAT, ATC, am serving as scribe for Dr. [name].  HPI: Pt is a 75 y/o male presenting w/ c/o B hip pain , R>L, x  approximately 4 weeks that began after sitting for a prolonged period of time at his grand-daughter's senior showcase.  He locates his pain to his B lateral and ant hip.  Pt likes to walk for exercise and is limited currently in how much/long he can walk due to pain.  Radiating pain: No Low back pain: yes but nothing new Aggravating factors: sitting; walking;  Treatments tried: hot tub; IBU; stretching  Diagnostic testing: R and L hip XR- 01/09/21  Pertinent review of Systems: No fevers or chills  Relevant historical information: Hypertension and diabetes.   Objective:    Vitals:   01/17/21 0800  BP: 110/62  Pulse: 80  SpO2: 96%   General: Well Developed, well nourished, and in no acute distress.   MSK: Hips bilaterally normal-appearing Decreased range of motion. Tender palpation greater trochanter right worse than left. Reduced hip abduction strength bilaterally 4/5 with reproduction of pain. Reduced hip external rotation strength 4/5. Mild antalgic gait.  Lab and Radiology Results DG Hip Unilat W OR W/O Pelvis 2-3 Views Left  Result Date: 01/12/2021 CLINICAL DATA:  Bilateral hip pain for several weeks. EXAM: DG HIP (WITH OR WITHOUT PELVIS) 2-3V LEFT COMPARISON:  None. FINDINGS: There is no evidence of hip fracture or dislocation. Mild osteophyte formation is noted without joint space narrowing. 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 No acute abnormality seen. Electronically Signed   By: Lupita Raider M.D.   On: 01/12/2021 08:38   DG Hip Unilat W OR W/O Pelvis 2-3 Views Right  Result Date: 01/12/2021 CLINICAL DATA:  Bilateral hip pain for several weeks. EXAM: DG HIP (WITH OR WITHOUT PELVIS) 2-3V RIGHT COMPARISON:  None. FINDINGS: There is no evidence of hip fracture or dislocation.  Mild osteophyte formation of right hip is noted without significant joint space narrowing. 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 No acute abnormality seen. Electronically Signed   By: Lupita Raider M.D.   On: 01/12/2021 08:38   I, [name], personally (independently) visualized and performed the interpretation of the images attached in this note.     Impression and Recommendations:    Assessment and Plan: 75 y.o. male with bilateral hip pain.  Pain is predominantly lateral hips and greater trochanteric and is due predominantly to hip abductor tendinopathy/greater trochanteric bursitis.  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  I think he will do very well with physical therapy trial.  Plan to refer to PT and reassess in about 6 weeks.  If not improved next step would be to consider injection or further advanced imaging.Marland Kitchen  PDMP not reviewed this encounter. Orders Placed This Encounter  Procedures   [place] LIMITED JOINT SPACE STRUCTURES LOW BILAT(NO LINKED CHARGES)    Order Specific Question:   Reason for Exam (SYMPTOM  OR DIAGNOSIS REQUIRED)    Answer:   B hip pain    Order Specific Question:   Preferred imaging location?    Answer:   [facility] referral to Physical Therapy    Referral Priority:   Routine    Referral Type:   Physical Medicine    Referral Reason:   Specialty Services  Required    Requested Specialty:   Physical Therapy    Number of Visits Requested:   1   No orders of the defined types were placed in this encounter.   Discussed warning signs or symptoms. Please see discharge instructions. Patient expresses understanding.   The above documentation has been reviewed and is accurate and complete [name], M.D.

## 2021-01-17 ENCOUNTER — Ambulatory Visit: Payer: Self-pay

## 2021-01-17 ENCOUNTER — Ambulatory Visit (INDEPENDENT_AMBULATORY_CARE_PROVIDER_SITE_OTHER): Payer: Medicare Other | Admitting: Family Medicine

## 2021-01-17 ENCOUNTER — Encounter: Payer: Self-pay | Admitting: Family Medicine

## 2021-01-17 ENCOUNTER — Other Ambulatory Visit: Payer: Self-pay

## 2021-01-17 VITALS — BP 110/62 | HR 80 | Ht 75.0 in | Wt 233.4 lb

## 2021-01-17 DIAGNOSIS — M7062 Trochanteric bursitis, left hip: Secondary | ICD-10-CM

## 2021-01-17 DIAGNOSIS — M25551 Pain in right hip: Secondary | ICD-10-CM | POA: Diagnosis not present

## 2021-01-17 DIAGNOSIS — M25552 Pain in left hip: Secondary | ICD-10-CM | POA: Diagnosis not present

## 2021-01-17 DIAGNOSIS — M7061 Trochanteric bursitis, right hip: Secondary | ICD-10-CM | POA: Diagnosis not present

## 2021-01-17 NOTE — Patient Instructions (Signed)
Thank you for coming in today.   I've referred you to Physical Therapy.  Let us know if you don't hear from them in one week.   Recheck in 6 weeks.   Let me know if you have a problem.    

## 2021-01-22 ENCOUNTER — Other Ambulatory Visit: Payer: Self-pay | Admitting: Family Medicine

## 2021-01-23 ENCOUNTER — Ambulatory Visit (INDEPENDENT_AMBULATORY_CARE_PROVIDER_SITE_OTHER): Payer: Medicare Other | Admitting: Rehabilitative and Restorative Service Providers"

## 2021-01-23 ENCOUNTER — Encounter: Payer: Self-pay | Admitting: Rehabilitative and Restorative Service Providers"

## 2021-01-23 ENCOUNTER — Other Ambulatory Visit: Payer: Self-pay

## 2021-01-23 DIAGNOSIS — M25651 Stiffness of right hip, not elsewhere classified: Secondary | ICD-10-CM | POA: Diagnosis not present

## 2021-01-23 DIAGNOSIS — M25551 Pain in right hip: Secondary | ICD-10-CM | POA: Diagnosis not present

## 2021-01-23 DIAGNOSIS — R262 Difficulty in walking, not elsewhere classified: Secondary | ICD-10-CM | POA: Diagnosis not present

## 2021-01-23 DIAGNOSIS — M25652 Stiffness of left hip, not elsewhere classified: Secondary | ICD-10-CM | POA: Diagnosis not present

## 2021-01-23 DIAGNOSIS — M6281 Muscle weakness (generalized): Secondary | ICD-10-CM

## 2021-01-23 DIAGNOSIS — M25552 Pain in left hip: Secondary | ICD-10-CM | POA: Diagnosis not present

## 2021-01-23 NOTE — Patient Instructions (Signed)
Access Code: [email or web address] URL: [email or web address] Date: 01/23/2021 Prepared by: Pauletta Browns  Exercises Standing Lumbar Extension at Wall - Forearms - 5 x daily - 7 x weekly - 1 sets - 5 reps - 3 seconds hold Lower Trunk Rotations - 2-3 x daily - 7 x weekly - 1 sets - 20 reps - 10 seconds hold Single Knee to Chest Stretch - 2-3 x daily - 7 x weekly - 1 sets - 5 reps - 20 seconds hold Supine Figure 4 Piriformis Stretch - 2-3 x daily - 7 x weekly - 1 sets - 5 reps - 20 seconds hold

## 2021-01-23 NOTE — Therapy (Signed)
[facility] [address] [place], [place], [id number] Phone: [phone]   Fax:  [phone]  Physical Therapy Evaluation  Patient Details  Name: Luis BalesLawrence Fernandez MRN: [id number] Date of Birth: 09-Jul-1946 Referring Provider (PT): Rodolph BongEvan S Corey MD   Encounter Date: 01/23/2021   PT End of Session - 01/23/21 1449     Visit Number 1    Number of Visits 12    PT Start Time 1354    PT Stop Time 1438    PT Time Calculation (min) 44 min    Activity Tolerance Patient tolerated treatment well;No increased pain    Behavior During Therapy WFL for tasks assessed/performed             Past Medical History:  Diagnosis Date   Diabetes mellitus without complication (HCC)    Hypertension    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    Subjective Assessment - 01/23/21 1444     Subjective Luis NajjarLarry had an acute flare-up of B hip pain after a 5 hour presentation/gathering at his granddaughter's school where he only was able to stand 1X during the 4-5 hour performance.  He would like to get back to gardening and taking longer walks with his dog.    Pertinent History Controlled diabetes and HTN    Limitations Sitting;House hold activities;Standing;Walking    How long can you sit comfortably? Tries to get up frequently    How long can you stand comfortably? < 30 minutes    How long can you walk comfortably? < 10-15 minutes    Patient Stated Goals Return to the garden and longer walks with the dog without B hip pain    Currently in Pain? Yes    Pain Score 4     Pain Location Hip    Pain Orientation Left;Right    Pain Descriptors / Indicators Aching;Tightness;Sore    Pain Type Acute pain    Pain  Radiating Towards NA    Pain Onset More than a month ago    Pain Frequency Constant    Aggravating Factors  Prolonged postures    Pain Relieving Factors Change of position    Effect of Pain on Daily Activities Unable to garden or take long walks with the dog    Multiple Pain Sites No                [facility] PT Assessment - 01/23/21 0001       Assessment   Medical Diagnosis B trochanteric bursitis    Referring Provider (PT) Rodolph BongEvan S Corey MD    Onset Date/Surgical Date --   4-5 weeks duration     Precautions   Precautions None      Restrictions   Weight Bearing Restrictions No      Balance Screen   Has the patient fallen in the past 6 months No    Has the patient had a decrease in activity level because of a fear of falling?  No    Is the patient reluctant to leave their home because of a fear of falling?  No      Home Environment   Living Environment  Private residence    Additional Comments 3 stairs      Prior Function   Level of Independence Independent    Vocation Retired    Leisure Walking, gardening, yard work      Copy Status Within [employer] for tasks assessed      Observation/Other Assessments   Focus on Therapeutic Outcomes (FOTO)  49 (Goal 66)      ROM / Strength   AROM / PROM / Strength AROM      AROM   Overall AROM  Deficits    AROM Assessment Site Hip    Right/Left Hip Left;Right    Right Hip Flexion 70    Right Hip External Rotation  24    Right Hip Internal Rotation  2    Left Hip Flexion 70    Left Hip External Rotation  32    Left Hip Internal Rotation  4      Flexibility   Soft Tissue Assessment /Muscle Length yes    Hamstrings 30 degrees/30 degrees                        Objective measurements completed on examination: See above findings.       [facility] Adult PT Treatment/Exercise - 01/23/21 0001       Therapeutic Activites    Therapeutic Activities Other Therapeutic Activities     Other Therapeutic Activities Review exam findings      Exercises   Exercises Knee/Hip      Knee/Hip Exercises: Stretches   Hip Flexor Stretch Both;5 reps;20 seconds;Limitations    Hip Flexor Stretch Limitations single knee to chest stretch with other leg pressed into extension    Piriformis Stretch Both;5 reps;20 seconds;Limitations    Piriformis Stretch Limitations Figure 4 stretch with self-overpressure    Other Knee/Hip Stretches Pelvic rotation AROM 20X 10 seconds      Knee/Hip Exercises: Standing   Other Standing Knee Exercises Standing lumbar extension AROM (hips forward) 10X 3 seconds                    PT Education - 01/23/21 1449     Education Details Reviewed exam findings and starter HEP    Person(s) Educated Patient    Methods Explanation;Demonstration;Verbal cues;Handout    Comprehension Verbal cues required;Returned demonstration;Need further instruction;Verbalized understanding              PT Short Term Goals - 01/23/21 1455       PT SHORT TERM GOAL #1   Title Luis Fernandez will be able to take longer walks with his dog (10-15 minutes) without increased B hip pain.    Baseline Painful    Time 4    Period Weeks    Status New    Target Date 02/20/21               PT Long Term Goals - 01/23/21 1456       PT LONG TERM GOAL #1   Title Improve FOTO to 66.    Baseline 49    Time 6    Period Weeks    Status New    Target Date 03/06/21      PT LONG TERM GOAL #2   Title Improve B hip flexors flexibility to 90 degrees, hamstrings to 40 degrees, ER to 40 and IR to 5 degrees.    Baseline See objective    Time 6    Period  Weeks    Status New    Target Date 03/06/21      PT LONG TERM GOAL #3   Title Luis Fernandez will report B hip pain consistently 0-2/10 on the Numeric Pain Rating Scale.    Baseline Can be 5/10    Time 6    Period Weeks    Status New    Target Date 03/06/21      PT LONG TERM GOAL #4   Title Luis Fernandez will be independent with his  long-term management HEP.    Time 6    Period Weeks    Status New    Target Date 03/06/21                    Plan - 01/23/21 1450     Clinical Impression Statement Luis Fernandez had an onset of B hip pain after prolonged sitting at his granddaugther's showcase at school.  He has not been able to walk or garden as he would like since.  B hips are very stiff and strength assessment was deferred due to significant stiffness.  As AROM and flexibility improve, strength will be assessed.  With adequate stretching and strength work, Luis Fernandez should meet LTGs established today.    Personal Factors and Comorbidities Comorbidity 2    Comorbidities Controlled diabetes and HTN    Examination-Activity Limitations Sit;Sleep;Bed Mobility;Bend;Lift;Squat;Stairs;Locomotion Level;Stand    Examination-Participation Restrictions Interpersonal Relationship;Yard Work;Community Activity    Stability/Clinical Decision Making Stable/Uncomplicated    Clinical Decision Making Low    Rehab Potential Good    PT Frequency 2x / week    PT Duration 6 weeks    PT Treatment/Interventions ADLs/Self Care Home Management;Moist Heat;Cryotherapy;Therapeutic activities;Stair training;Gait training;Therapeutic exercise;Balance training;Neuromuscular re-education;Patient/family education;Manual techniques    PT Next Visit Plan Add hamstrings and gluteal stretch, review HEP, gentle strength progressions (leg press and possibly hip hike), tandem balance    PT Home Exercise Plan Access Code: [email or web address]  URL: [email or web address]  Date: 01/23/2021  Prepared by: Pauletta Browns    Exercises  Standing Lumbar Extension at Wall - Forearms - 5 x daily - 7 x weekly - 1 sets - 5 reps - 3 seconds hold  Lower Trunk Rotations - 2-3 x daily - 7 x weekly - 1 sets - 20 reps - 10 seconds hold  Single Knee to Chest Stretch - 2-3 x daily - 7 x weekly - 1 sets - 5 reps - 20 seconds hold  Supine Figure 4 Piriformis Stretch - 2-3 x daily - 7 x  weekly - 1 sets - 5 reps - 20 seconds hold    Consulted and Agree with Plan of Care Patient             Patient will benefit from skilled therapeutic intervention in order to improve the following deficits and impairments:  Abnormal gait, Decreased activity tolerance, Decreased endurance, Decreased range of motion, Decreased strength, Difficulty walking, Impaired flexibility, Pain  Visit Diagnosis: Muscle weakness (generalized)  Difficulty in walking, not elsewhere classified  Stiffness of left hip, not elsewhere classified  Stiffness of right hip, not elsewhere classified  Pain in left hip  Pain in right hip     Problem List Patient Active Problem List   Diagnosis Date Noted   Bilateral hip pain 01/12/2021   Recurrent sinusitis 01/10/2020   Paresthesia 06/23/2016   OSA (obstructive sleep apnea) 06/23/2016   B12 deficiency 01/21/2016   Diabetes with neurologic complications (HCC) 12/17/2015   Hyperlipidemia LDL goal <70 12/17/2015  Hypertension associated with diabetes (HCC) 12/17/2015   Overweight (BMI 25.0-29.9) 12/17/2015   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 [place], [place], [address] Phone: [phone]   Fax:  [phone]  Name: Luis Fernandez MRN: [id number] Date of Birth: 1946-01-27

## 2021-01-27 ENCOUNTER — Other Ambulatory Visit: Payer: Self-pay

## 2021-01-27 ENCOUNTER — Other Ambulatory Visit: Payer: Self-pay | Admitting: Family Medicine

## 2021-01-27 ENCOUNTER — Ambulatory Visit (INDEPENDENT_AMBULATORY_CARE_PROVIDER_SITE_OTHER): Payer: Medicare Other | Admitting: Rehabilitative and Restorative Service Providers"

## 2021-01-27 ENCOUNTER — Encounter: Payer: Self-pay | Admitting: Rehabilitative and Restorative Service Providers"

## 2021-01-27 DIAGNOSIS — M6281 Muscle weakness (generalized): Secondary | ICD-10-CM

## 2021-01-27 DIAGNOSIS — M25651 Stiffness of right hip, not elsewhere classified: Secondary | ICD-10-CM | POA: Diagnosis not present

## 2021-01-27 DIAGNOSIS — M25551 Pain in right hip: Secondary | ICD-10-CM | POA: Diagnosis not present

## 2021-01-27 DIAGNOSIS — M25652 Stiffness of left hip, not elsewhere classified: Secondary | ICD-10-CM | POA: Diagnosis not present

## 2021-01-27 DIAGNOSIS — R262 Difficulty in walking, not elsewhere classified: Secondary | ICD-10-CM | POA: Diagnosis not present

## 2021-01-27 DIAGNOSIS — M25552 Pain in left hip: Secondary | ICD-10-CM | POA: Diagnosis not present

## 2021-01-27 MED ORDER — CEPHALEXIN 500 MG PO CAPS: ORAL_CAPSULE | ORAL | 1 refills | Status: DC

## 2021-01-27 NOTE — Therapy (Signed)
[facility] Physical Therapy [address] [place], [place], [address] Phone: [phone]   Fax:  [phone]  Physical Therapy Treatment  Patient Details  Name: Luis Fernandez MRN: [id number] Date of Birth: July 25, 1946 Referring Provider (PT): Rodolph Bong MD   Encounter Date: 01/27/2021   PT End of Session - 01/27/21 1128     Visit Number 2    Number of Visits 12    PT Start Time 0804    PT Stop Time 0845    PT Time Calculation (min) 41 min    Activity Tolerance Patient tolerated treatment well;No increased pain    Behavior During Therapy WFL for tasks assessed/performed             Past Medical History:  Diagnosis Date   Diabetes mellitus without complication (HCC)    Hypertension    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   Subjective Assessment - 01/27/21 0815     Subjective Luis Fernandez reports walking better since starting PT.  He is doing his stretches 3X/day.    Pertinent History Controlled diabetes and HTN    Limitations Sitting;House hold activities;Standing;Walking    How long can you sit comfortably? Tries to get up frequently    How long can you stand comfortably? < 30 minutes    How long can you walk comfortably? < 10-15 minutes    Patient Stated Goals Return to the garden and longer walks with the dog without B hip pain    Currently in Pain? Yes    Pain Score 4     Pain Location Hip    Pain Orientation Left;Right    Pain Descriptors / Indicators Aching;Tightness;Sore    Pain Type Acute pain    Pain Radiating Towards NA    Pain Onset More than a month ago    Pain Frequency Constant    Aggravating Factors  Prolonged postures    Pain Relieving Factors Change of position     Effect of Pain on Daily Activities Unable to graden or take long walks with the dog.    Multiple Pain Sites No                               [facility] Adult PT Treatment/Exercise - 01/27/21 0001       Exercises   Exercises Knee/Hip      Knee/Hip Exercises: Stretches   Active Hamstring Stretch Both;5 reps;20 seconds;Other (comment)    Active Hamstring Stretch Limitations Other leg straight    Hip Flexor Stretch Both;5 reps;20 seconds;Limitations    Hip Flexor Stretch Limitations single knee to chest stretch with other leg pressed into extension    Piriformis Stretch Both;5 reps;20 seconds;Limitations    Piriformis Stretch Limitations Figure 4 stretch with self-overpressure    Other Knee/Hip Stretches Pelvic rotation AROM 20X 10 seconds    Other Knee/Hip Stretches Gluteal stretch 5X 20 seconds (knee to opposite shoulder with other leg straight)      Knee/Hip Exercises: Machines for Strengthening   Cybex Leg Press 100# B and 50# each leg 10X slow eccentrics  Knee/Hip Exercises: Standing   Other Standing Knee Exercises Standing lumbar extension AROM (hips forward) 10X 3 seconds    Other Standing Knee Exercises Alternating hip hike 2 sets of 10 for 3 seconds                    PT Education - 01/27/21 1127     Education Details Reviewed HEP with corrections given.  Added 2 additional stretches.  Added 2 strength activities for the office only.    Person(s) Educated Patient    Methods Explanation;Demonstration;Tactile cues;Verbal cues;Handout    Comprehension Verbal cues required;Need further instruction;Returned demonstration;Verbalized understanding;Tactile cues required              PT Short Term Goals - 01/27/21 1128       PT SHORT TERM GOAL #1   Title Luis Fernandez will be able to take longer walks with his dog (10-15 minutes) without increased B hip pain.    Baseline Painful    Time 4    Period Weeks    Status On-going    Target Date  02/20/21               PT Long Term Goals - 01/27/21 1128       PT LONG TERM GOAL #1   Title Improve FOTO to 66.    Baseline 49    Time 6    Period Weeks    Status On-going      PT LONG TERM GOAL #2   Title Improve B hip flexors flexibility to 90 degrees, hamstrings to 40 degrees, ER to 40 and IR to 5 degrees.    Baseline See objective    Time 6    Period Weeks    Status On-going      PT LONG TERM GOAL #3   Title Luis Fernandez will report B hip pain consistently 0-2/10 on the Numeric Pain Rating Scale.    Baseline Can be 5/10    Time 6    Period Weeks    Status On-going      PT LONG TERM GOAL #4   Title Luis Fernandez will be independent with his long-term management HEP.    Time 6    Period Weeks    Status On-going                   Plan - 01/27/21 1129     Clinical Impression Statement Luis Fernandez reports excellent 3X/Day HEP compliance since evaluation.  He notes subjective progress with his walking already.  Added 2 new stretches and 2 new strength activities to address impairments noted at evaluation and with gait assessment today.  His prognosis remains good with continued work.    Personal Factors and Comorbidities Comorbidity 2    Comorbidities Controlled diabetes and HTN    Examination-Activity Limitations Sit;Sleep;Bed Mobility;Bend;Lift;Squat;Stairs;Locomotion Level;Stand    Examination-Participation Restrictions Interpersonal Relationship;Yard Work;Community Activity    Stability/Clinical Decision Making Stable/Uncomplicated    Rehab Potential Good    PT Frequency 2x / week    PT Duration 6 weeks    PT Treatment/Interventions ADLs/Self Care Home Management;Moist Heat;Cryotherapy;Therapeutic activities;Stair training;Gait training;Therapeutic exercise;Balance training;Neuromuscular re-education;Patient/family education;Manual techniques    PT Next Visit Plan Review program, progress leg press as appropriate, tandem balance    PT Home Exercise Plan Access Code:  [email or web address]  URL: [email or web address]  Date: 01/23/2021  Prepared by: Pauletta Browns    Exercises  Standing Lumbar Extension at Wall - Forearms - 5 x daily - 7 x  weekly - 1 sets - 5 reps - 3 seconds hold  Lower Trunk Rotations - 2-3 x daily - 7 x weekly - 1 sets - 20 reps - 10 seconds hold  Single Knee to Chest Stretch - 2-3 x daily - 7 x weekly - 1 sets - 5 reps - 20 seconds hold  Supine Figure 4 Piriformis Stretch - 2-3 x daily - 7 x weekly - 1 sets - 5 reps - 20 seconds hold    Consulted and Agree with Plan of Care Patient             Patient will benefit from skilled therapeutic intervention in order to improve the following deficits and impairments:  Abnormal gait, Decreased activity tolerance, Decreased endurance, Decreased range of motion, Decreased strength, Difficulty walking, Impaired flexibility, Pain  Visit Diagnosis: Difficulty in walking, not elsewhere classified  Muscle weakness (generalized)  Stiffness of left hip, not elsewhere classified  Stiffness of right hip, not elsewhere classified  Pain in left hip  Pain in right hip     Problem List Patient Active Problem List   Diagnosis Date Noted   Bilateral hip pain 01/12/2021   Recurrent sinusitis 01/10/2020   Paresthesia 06/23/2016   OSA (obstructive sleep apnea) 06/23/2016   B12 deficiency 01/21/2016   Diabetes with neurologic complications (HCC) 12/17/2015   Hyperlipidemia LDL goal <70 12/17/2015   Hypertension associated with diabetes (HCC) 12/17/2015   Overweight (BMI 25.0-29.9) 12/17/2015   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

## 2021-01-27 NOTE — Patient Instructions (Signed)
Added hamstrings stretch and gluteal stretch to HEP

## 2021-01-30 ENCOUNTER — Other Ambulatory Visit: Payer: Self-pay

## 2021-01-30 ENCOUNTER — Encounter: Payer: Self-pay | Admitting: Rehabilitative and Restorative Service Providers"

## 2021-01-30 ENCOUNTER — Ambulatory Visit (INDEPENDENT_AMBULATORY_CARE_PROVIDER_SITE_OTHER): Payer: Medicare Other | Admitting: Rehabilitative and Restorative Service Providers"

## 2021-01-30 DIAGNOSIS — M25652 Stiffness of left hip, not elsewhere classified: Secondary | ICD-10-CM | POA: Diagnosis not present

## 2021-01-30 DIAGNOSIS — M25551 Pain in right hip: Secondary | ICD-10-CM | POA: Diagnosis not present

## 2021-01-30 DIAGNOSIS — M25552 Pain in left hip: Secondary | ICD-10-CM

## 2021-01-30 DIAGNOSIS — R262 Difficulty in walking, not elsewhere classified: Secondary | ICD-10-CM | POA: Diagnosis not present

## 2021-01-30 DIAGNOSIS — M25651 Stiffness of right hip, not elsewhere classified: Secondary | ICD-10-CM | POA: Diagnosis not present

## 2021-01-30 DIAGNOSIS — M6281 Muscle weakness (generalized): Secondary | ICD-10-CM | POA: Diagnosis not present

## 2021-01-30 NOTE — Therapy (Signed)
[facility] Physical Therapy [address] [place], [place], [address] Phone: [phone]   Fax:  [phone]  Physical Therapy Treatment  Patient Details  Name: Luis Fernandez MRN: [id number] Date of Birth: [birth year] Referring Provider (PT): Luis Bong MD   Encounter Date: 01/30/2021   PT End of Session - 01/30/21 1258     Visit Number 3    Number of Visits 12    Date for PT Re-Evaluation 03/06/21    Authorization Type Medicare , KX at 15    Progress Note Due on Visit 10    PT Start Time 1257    PT Stop Time 1336    PT Time Calculation (min) 39 min    Activity Tolerance Patient tolerated treatment well    Behavior During Therapy [facility] for tasks assessed/performed             Past Medical History:  Diagnosis Date   Diabetes mellitus without complication (HCC)    Hypertension    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   Subjective Assessment - 01/30/21 1301     Subjective Pt. indicated feeling soreness in both hips 3-4 or so.  Pt. indicated it improves after standing and moving a bit.  Pt. indicated just getting some good steady improvement at this time.    Pertinent History Controlled diabetes and HTN    Limitations Sitting;House hold activities;Standing;Walking    How long can you sit comfortably? 1 hr in normal chair at home    How long can you stand comfortably? 45-60 mins    How long can you walk comfortably? 30 mins    Patient Stated Goals Return to the garden and longer walks with the dog without B hip pain    Currently in Pain? Yes    Pain Score 3     Pain Location Hip    Pain Orientation Left;Right    Pain Descriptors / Indicators Aching;Tightness;Sore    Pain Onset More  than a month ago    Aggravating Factors  sitting prolonged, transfers after sitting    Pain Relieving Factors movement, exercises                [facility] PT Assessment - 01/30/21 0001       AROM   Overall AROM Comments measured in supine    Right Hip Flexion 105    Right Hip External Rotation  72   in hip 90 deg flexion   Right Hip Internal Rotation  19   in hip 90 deg flexion   Left Hip Flexion 110    Left Hip External Rotation  68   in hip 90 deg flexion   Left Hip Internal Rotation  20   in hip 90 deg flexion                          [facility] Adult PT Treatment/Exercise - 01/30/21 0001       Neuro Re-ed    Neuro Re-ed Details  tandem stance 1 min, performed bilateral c occasional HHA      Knee/Hip Exercises: Stretches   Piriformis Stretch 3 reps;Both  Piriformis Stretch Limitations Figure 4 stretch with self-overpressure    Other Knee/Hip Stretches Gluteal stretch  3 x 20 seconds (knee to opposite shoulder with other leg straight)      Knee/Hip Exercises: Aerobic   Nustep Lvl 6 10 mins UE/LE      Knee/Hip Exercises: Machines for Strengthening   Cybex Leg Press 100 lbs 2 x 10 bilateral, 50 lbs 2 x 10 bilateral c slow eccentrics                      PT Short Term Goals - 01/27/21 1128       PT SHORT TERM GOAL #1   Title [name] will be able to take longer walks with his dog (10-15 minutes) without increased B hip pain.    Baseline Painful    Time 4    Period Weeks    Status On-going    Target Date 02/20/21               PT Long Term Goals - 01/27/21 1128       PT LONG TERM GOAL #1   Title Improve FOTO to 66.    Baseline 49    Time 6    Period Weeks    Status On-going      PT LONG TERM GOAL #2   Title Improve B hip flexors flexibility to 90 degrees, hamstrings to 40 degrees, ER to 40 and IR to 5 degrees.    Baseline See objective    Time 6    Period Weeks    Status On-going      PT LONG TERM GOAL #3   Title [name]  will report B hip pain consistently 0-2/10 on the Numeric Pain Rating Scale.    Baseline Can be 5/10    Time 6    Period Weeks    Status On-going      PT LONG TERM GOAL #4   Title [name] will be independent with his long-term management HEP.    Time 6    Period Weeks    Status On-going                   Plan - 01/30/21 1328     Clinical Impression Statement Pt. indicated continued gains in overall symptom relief and noted improved tolerance to standing, walking and sitting actiivty as documented in subjective reporting today.  Pt. also demonstrated improved hip AROM as measured in supine today (see objective chart).  Pt. to benefit from continued progression in mobility and overall LE strength and endurance to facilitate continued gains towards full PLOF.    Personal Factors and Comorbidities Comorbidity 2    Comorbidities Controlled diabetes and HTN    Examination-Activity Limitations Sit;Sleep;Bed Mobility;Bend;Lift;Squat;Stairs;Locomotion Level;Stand    Examination-Participation Restrictions Interpersonal Relationship;Yard Work;Community Activity    Stability/Clinical Decision Making Stable/Uncomplicated    Rehab Potential Good    PT Frequency 2x / week    PT Duration 6 weeks    PT Treatment/Interventions ADLs/Self Care Home Management;Moist Heat;Cryotherapy;Therapeutic activities;Stair training;Gait training;Therapeutic exercise;Balance training;Neuromuscular re-education;Patient/family education;Manual techniques    PT Next Visit Plan IR mobility gains (most limited movement at this time), continued posterior/lateral hip strengthening/control.    PT Home Exercise Plan Access Code: [email or web address]  URL: [email or web address]  Date: 01/23/2021  Prepared by: Luis Fernandez    Exercises  Standing Lumbar Extension at Wall - Forearms - 5 x daily - 7 x weekly - 1 sets - 5 reps - 3  seconds hold  Lower Trunk Rotations - 2-3 x daily - 7 x weekly - 1 sets - 20 reps - 10 seconds  hold  Single Knee to Chest Stretch - 2-3 x daily - 7 x weekly - 1 sets - 5 reps - 20 seconds hold  Supine Figure 4 Piriformis Stretch - 2-3 x daily - 7 x weekly - 1 sets - 5 reps - 20 seconds hold    Consulted and Agree with Plan of Care Patient             Patient will benefit from skilled therapeutic intervention in order to improve the following deficits and impairments:  Abnormal gait, Decreased activity tolerance, Decreased endurance, Decreased range of motion, Decreased strength, Difficulty walking, Impaired flexibility, Pain  Visit Diagnosis: Difficulty in walking, not elsewhere classified  Muscle weakness (generalized)  Stiffness of left hip, not elsewhere classified  Stiffness of right hip, not elsewhere classified  Pain in left hip  Pain in right hip     Problem List Patient Active Problem List   Diagnosis Date Noted   Bilateral hip pain 01/12/2021   Recurrent sinusitis 01/10/2020   Paresthesia 06/23/2016   OSA (obstructive sleep apnea) 06/23/2016   B12 deficiency 01/21/2016   Diabetes with neurologic complications (HCC) 12/17/2015   Hyperlipidemia LDL goal <70 12/17/2015   Hypertension associated with diabetes (HCC) 12/17/2015   Overweight (BMI 25.0-29.9) 12/17/2015   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 [place], [place], [address] Phone: [phone]   Fax:  [phone]  Name: Luis Fernandez MRN: [id number] Date of Birth: 1946/02/26

## 2021-02-04 ENCOUNTER — Ambulatory Visit (INDEPENDENT_AMBULATORY_CARE_PROVIDER_SITE_OTHER): Payer: Medicare Other | Admitting: Physical Therapy

## 2021-02-04 ENCOUNTER — Other Ambulatory Visit: Payer: Self-pay

## 2021-02-04 ENCOUNTER — Encounter: Payer: Self-pay | Admitting: Physical Therapy

## 2021-02-04 DIAGNOSIS — M25552 Pain in left hip: Secondary | ICD-10-CM | POA: Diagnosis not present

## 2021-02-04 DIAGNOSIS — M25551 Pain in right hip: Secondary | ICD-10-CM

## 2021-02-04 DIAGNOSIS — R262 Difficulty in walking, not elsewhere classified: Secondary | ICD-10-CM | POA: Diagnosis not present

## 2021-02-04 DIAGNOSIS — M25652 Stiffness of left hip, not elsewhere classified: Secondary | ICD-10-CM

## 2021-02-04 DIAGNOSIS — M25651 Stiffness of right hip, not elsewhere classified: Secondary | ICD-10-CM | POA: Diagnosis not present

## 2021-02-04 DIAGNOSIS — M6281 Muscle weakness (generalized): Secondary | ICD-10-CM

## 2021-02-04 NOTE — Therapy (Signed)
[facility] Physical Therapy [address] [place], [place], [address] Phone: [phone]   Fax:  [phone]  Physical Therapy Treatment  Patient Details  Name: Luis Fernandez MRN: [id number] Date of Birth: 1946/03/08 Referring Provider (PT): Rodolph Bong MD   Encounter Date: 02/04/2021   PT End of Session - 02/04/21 0856     Visit Number 4    Number of Visits 12    Date for PT Re-Evaluation 03/06/21    Authorization Type Medicare , KX at 15    Progress Note Due on Visit 10    PT Start Time 0848    PT Stop Time 0926    PT Time Calculation (min) 38 min    Activity Tolerance Patient tolerated treatment well    Behavior During Therapy [facility] for tasks assessed/performed             Past Medical History:  Diagnosis Date   Diabetes mellitus without complication (HCC)    Hypertension    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   Subjective Assessment - 02/04/21 0851     Subjective Pt arriving today reporting 2/10 soreness in right buttock and groin. Pt reporting compliance in his HEP and the burning sensation has stopped.    Pertinent History Controlled diabetes and HTN    Limitations Sitting;House hold activities;Standing;Walking    Patient Stated Goals Return to the garden and longer walks with the dog without B hip pain    Currently in Pain? Yes    Pain Score 2     Pain Location Hip    Pain Orientation Right    Pain Descriptors / Indicators Sore    Pain Type Acute pain    Pain Onset More than a month ago                               [facility] Adult PT Treatment/Exercise - 02/04/21 0001       Knee/Hip Exercises: Stretches   Piriformis Stretch 3 reps;Both     Piriformis Stretch Limitations Figure 4 stretch with self-overpressure    Other Knee/Hip Stretches Figure 4 rotation with overpressure by therapist holding 25 seconds x 3    Other Knee/Hip Stretches Gluteal stretch  3 x 20 seconds (knee to opposite shoulder with other leg straight)      Knee/Hip Exercises: Aerobic   Recumbent Bike L4, x 7 minutes seat at 10      Knee/Hip Exercises: Machines for Strengthening   Cybex Leg Press 100 lbs 3 x 15 bilateral, 50 lbs 2 x 10 bilateral c slow eccentrics      Knee/Hip Exercises: Standing   Hip Abduction Stengthening;Both;2 sets;10 reps    Lateral Step Up 15 reps;Hand Hold: 1      Knee/Hip Exercises: Sidelying   Clams 2x10 Rt LE  controlled eccentrics      Manual Therapy   Manual Therapy Soft tissue mobilization    Soft tissue mobilization lateral right hip x 5 minutes                      PT  Short Term Goals - 01/27/21 1128       PT SHORT TERM GOAL #1   Title Luis Fernandez will be able to take longer walks with his dog (10-15 minutes) without increased B hip pain.    Baseline Painful    Time 4    Period Weeks    Status On-going    Target Date 02/20/21               PT Long Term Goals - 02/04/21 0936       PT LONG TERM GOAL #1   Title Improve FOTO to 66.    Status On-going      PT LONG TERM GOAL #2   Title Improve B hip flexors flexibility to 90 degrees, hamstrings to 40 degrees, ER to 40 and IR to 5 degrees.    Status On-going      PT LONG TERM GOAL #3   Title Luis Fernandez will report B hip pain consistently 0-2/10 on the Numeric Pain Rating Scale.    Status On-going      PT LONG TERM GOAL #4   Title Luis Fernandez will be independent with his long-term management HEP.    Status On-going      PT LONG TERM GOAL #5   Title -    Status --                   Plan - 02/04/21 0913     Clinical Impression Statement Pt arriving today reporting 2/10 soreness in right hip and in groin. Pt reporting burning sensation has  stopped. Pt tolerating all exercises well. Tightness noted in right hip internal rotation. STM to right lateral hip. Pt reporting feeling less tightness at end of session. Continue skilled PT to maximize function.    Personal Factors and Comorbidities Comorbidity 2    Comorbidities Controlled diabetes and HTN    Examination-Activity Limitations Sit;Sleep;Bed Mobility;Bend;Lift;Squat;Stairs;Locomotion Level;Stand    Examination-Participation Restrictions Interpersonal Relationship;Yard Work;Community Activity    Stability/Clinical Decision Making Stable/Uncomplicated    Rehab Potential Good    PT Frequency 2x / week    PT Duration 6 weeks    PT Treatment/Interventions ADLs/Self Care Home Management;Moist Heat;Cryotherapy;Therapeutic activities;Stair training;Gait training;Therapeutic exercise;Balance training;Neuromuscular re-education;Patient/family education;Manual techniques    PT Next Visit Plan IR mobility gains (most limited movement at this time), continued posterior/lateral hip strengthening/control.    PT Home Exercise Plan Access Code: [email or web address]  URL: [email or web address]  Date: 01/23/2021  Prepared by: Pauletta Browns    Exercises  Standing Lumbar Extension at Wall - Forearms - 5 x daily - 7 x weekly - 1 sets - 5 reps - 3 seconds hold  Lower Trunk Rotations - 2-3 x daily - 7 x weekly - 1 sets - 20 reps - 10 seconds hold  Single Knee to Chest Stretch - 2-3 x daily - 7 x weekly - 1 sets - 5 reps - 20 seconds hold  Supine Figure 4 Piriformis Stretch - 2-3 x daily - 7 x weekly - 1 sets - 5 reps - 20 seconds hold    Consulted and Agree with Plan of Care Patient             Patient will benefit from skilled therapeutic intervention in order to improve the following deficits and impairments:  Abnormal gait, Decreased activity tolerance, Decreased endurance, Decreased range of motion, Decreased strength, Difficulty walking, Impaired flexibility, Pain  Visit Diagnosis: Difficulty  in walking, not elsewhere classified  Muscle weakness (generalized)  Stiffness of  left hip, not elsewhere classified  Stiffness of right hip, not elsewhere classified  Pain in left hip  Pain in right hip     Problem List Patient Active Problem List   Diagnosis Date Noted   Bilateral hip pain 01/12/2021   Recurrent sinusitis 01/10/2020   Paresthesia 06/23/2016   OSA (obstructive sleep apnea) 06/23/2016   B12 deficiency 01/21/2016   Diabetes with neurologic complications (HCC) 12/17/2015   Hyperlipidemia LDL goal <70 12/17/2015   Hypertension associated with diabetes (HCC) 12/17/2015   Overweight (BMI 25.0-29.9) 12/17/2015   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

## 2021-02-11 ENCOUNTER — Encounter: Payer: Self-pay | Admitting: Physical Therapy

## 2021-02-11 ENCOUNTER — Other Ambulatory Visit: Payer: Self-pay

## 2021-02-11 ENCOUNTER — Ambulatory Visit (INDEPENDENT_AMBULATORY_CARE_PROVIDER_SITE_OTHER): Payer: Medicare Other | Admitting: Physical Therapy

## 2021-02-11 DIAGNOSIS — R262 Difficulty in walking, not elsewhere classified: Secondary | ICD-10-CM

## 2021-02-11 DIAGNOSIS — M6281 Muscle weakness (generalized): Secondary | ICD-10-CM | POA: Diagnosis not present

## 2021-02-11 DIAGNOSIS — M25651 Stiffness of right hip, not elsewhere classified: Secondary | ICD-10-CM | POA: Diagnosis not present

## 2021-02-11 DIAGNOSIS — M25551 Pain in right hip: Secondary | ICD-10-CM | POA: Diagnosis not present

## 2021-02-11 DIAGNOSIS — M25652 Stiffness of left hip, not elsewhere classified: Secondary | ICD-10-CM | POA: Diagnosis not present

## 2021-02-11 DIAGNOSIS — M25552 Pain in left hip: Secondary | ICD-10-CM | POA: Diagnosis not present

## 2021-02-11 NOTE — Therapy (Signed)
[facility] Physical Therapy [address] [place], [place], [address] Phone: [phone]   Fax:  [phone]  Physical Therapy Treatment  Patient Details  Name: Luis Fernandez MRN: [id number] Date of Birth: 1946/03/17 Referring Provider (PT): Rodolph Bong MD   Encounter Date: 02/11/2021   PT End of Session - 02/11/21 1031     Visit Number 5    Number of Visits 12    Date for PT Re-Evaluation 03/06/21    Authorization Type Medicare , KX at 15    Progress Note Due on Visit 10    PT Start Time 1015    PT Stop Time 1055    PT Time Calculation (min) 40 min    Activity Tolerance Patient tolerated treatment well    Behavior During Therapy [facility] for tasks assessed/performed             Past Medical History:  Diagnosis Date   Diabetes mellitus without complication (HCC)    Hypertension    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   Subjective Assessment - 02/11/21 1029     Subjective Pt arriving today reporting good response to last treatment with mild soreness the following day. Pt believes the soft tissue treatment seemed to help. Pt reporting 2/10 pain today.    Limitations Sitting;House hold activities;Standing;Walking    How long can you sit comfortably? 1 hr in normal chair at home    How long can you stand comfortably? 45-60 mins    How long can you walk comfortably? 30 mins    Patient Stated Goals Return to the garden and longer walks with the dog without B hip pain    Currently in Pain? Yes    Pain Score 2     Pain Location Hip    Pain Orientation Right    Pain Descriptors / Indicators Sore    Pain Type Acute pain    Pain Onset More than a month ago                                [facility] Adult PT Treatment/Exercise - 02/11/21 0001       Knee/Hip Exercises: Stretches   Piriformis Stretch Limitations Figure 4 stretch with self-overpressure    Other Knee/Hip Stretches SKTC x 2 holding 20 seconds on each LE    Other Knee/Hip Stretches trunk rotation 20 seconds to each side x 3 reps      Knee/Hip Exercises: Aerobic   Recumbent Bike L4, x 7 minutes seat at 10      Knee/Hip Exercises: Machines for Strengthening   Cybex Leg Press 112#  2x15, 56# 2x15 single LE working on controlled eccentric movements with each LE.      Knee/Hip Exercises: Standing   Hip Abduction Stengthening;Both;2 sets;10 reps    Hip Extension Stengthening;Both;2 sets;10 reps    Lateral Step Up 15 reps;Hand Hold: 1    Other Standing Knee Exercises TRX squats with instructions on hip flexion x 10      Manual Therapy   Manual Therapy Soft tissue mobilization    Soft tissue mobilization IASTM  lateral right hip x 7 minutes                      PT Short Term Goals - 02/11/21 1033       PT SHORT TERM GOAL #1   Title [name] will be able to take longer walks with his dog (10-15 minutes) without increased B hip pain.    Baseline Pt reporting mild soreness in his right hip, but he is able to walk up to 1 mile    Status Achieved               PT Long Term Goals - 02/11/21 1034       PT LONG TERM GOAL #1   Title Improve FOTO to 66.    Status On-going      PT LONG TERM GOAL #2   Title Improve B hip flexors flexibility to 90 degrees, hamstrings to 40 degrees, ER to 40 and IR to 5 degrees.    Status On-going      PT LONG TERM GOAL #3   Title [name] will report B hip pain consistently 0-2/10 on the Numeric Pain Rating Scale.    Status On-going      PT LONG TERM GOAL #4   Title [name] will be independent with his long-term management HEP.    Status On-going                   Plan - 02/11/21 1031     Clinical Impression Statement  Pt arriving today reporting good response to last treatment session and STM. Pt reporting 2/10 pain in right hip. Treatment focused on stretching, LE strengthening and STM to right lateral hip/glutes. Continue skilled PT to maximize funciton.    Personal Factors and Comorbidities Comorbidity 2    Comorbidities Controlled diabetes and HTN    Examination-Activity Limitations Sit;Sleep;Bed Mobility;Bend;Lift;Squat;Stairs;Locomotion Level;Stand    Examination-Participation Restrictions Interpersonal Relationship;Yard Work;Community Activity    Stability/Clinical Decision Making Stable/Uncomplicated    Rehab Potential Good    PT Frequency 2x / week    PT Duration 6 weeks    PT Treatment/Interventions ADLs/Self Care Home Management;Moist Heat;Cryotherapy;Therapeutic activities;Stair training;Gait training;Therapeutic exercise;Balance training;Neuromuscular re-education;Patient/family education;Manual techniques    PT Next Visit Plan IR mobility gains (most limited movement at this time), continued posterior/lateral hip strengthening/control.    PT Home Exercise Plan Access Code: [email or web address]  URL: [email or web address]  Date: 01/23/2021  Prepared by: Pauletta Browns    Exercises  Standing Lumbar Extension at Wall - Forearms - 5 x daily - 7 x weekly - 1 sets - 5 reps - 3 seconds hold  Lower Trunk Rotations - 2-3 x daily - 7 x weekly - 1 sets - 20 reps - 10 seconds hold  Single Knee to Chest Stretch - 2-3 x daily - 7 x weekly - 1 sets - 5 reps - 20 seconds hold  Supine Figure 4 Piriformis Stretch - 2-3 x daily - 7 x weekly - 1 sets - 5 reps - 20 seconds hold    Consulted and Agree with Plan of Care Patient             Patient will benefit from skilled therapeutic intervention in order to improve the following deficits and impairments:  Abnormal gait, Decreased activity tolerance, Decreased endurance, Decreased range of motion, Decreased strength, Difficulty walking, Impaired flexibility,  Pain  Visit Diagnosis: Difficulty in walking, not elsewhere classified  Muscle weakness (generalized)  Stiffness of left hip, not  elsewhere classified  Stiffness of right hip, not elsewhere classified  Pain in left hip  Pain in right hip     Problem List Patient Active Problem List   Diagnosis Date Noted   Bilateral hip pain 01/12/2021   Recurrent sinusitis 01/10/2020   Paresthesia 06/23/2016   OSA (obstructive sleep apnea) 06/23/2016   B12 deficiency 01/21/2016   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 [place], [place], [address] Phone: [phone]   Fax:  [phone]  Name: Kee Drudge MRN: [id number] Date of Birth: Jun 07, 1946

## 2021-02-13 ENCOUNTER — Encounter: Payer: Self-pay | Admitting: Rehabilitative and Restorative Service Providers"

## 2021-02-13 ENCOUNTER — Other Ambulatory Visit: Payer: Self-pay

## 2021-02-13 ENCOUNTER — Ambulatory Visit (INDEPENDENT_AMBULATORY_CARE_PROVIDER_SITE_OTHER): Payer: Medicare Other | Admitting: Rehabilitative and Restorative Service Providers"

## 2021-02-13 DIAGNOSIS — R262 Difficulty in walking, not elsewhere classified: Secondary | ICD-10-CM | POA: Diagnosis not present

## 2021-02-13 DIAGNOSIS — M25552 Pain in left hip: Secondary | ICD-10-CM

## 2021-02-13 DIAGNOSIS — M25652 Stiffness of left hip, not elsewhere classified: Secondary | ICD-10-CM

## 2021-02-13 DIAGNOSIS — M6281 Muscle weakness (generalized): Secondary | ICD-10-CM

## 2021-02-13 DIAGNOSIS — M25651 Stiffness of right hip, not elsewhere classified: Secondary | ICD-10-CM

## 2021-02-13 DIAGNOSIS — M25551 Pain in right hip: Secondary | ICD-10-CM | POA: Diagnosis not present

## 2021-02-13 NOTE — Patient Instructions (Signed)
Access Code: [email or web address] URL: [email or web address] Date: 02/13/2021 Prepared by: Pauletta Browns  Exercises Standing Lumbar Extension at Wall - Forearms - 5 x daily - 7 x weekly - 1 sets - 5 reps - 3 seconds hold Lower Trunk Rotations - 1 x daily - 7 x weekly - 1 sets - 20 reps - 10 seconds hold Single Knee to Chest Stretch - 2 x daily - 7 x weekly - 1 sets - 5 reps - 20 seconds hold Supine Figure 4 Piriformis Stretch - 3 x daily - 7 x weekly - 1 sets - 5 reps - 20 seconds hold Supine Gluteus Stretch - 3 x daily - 7 x weekly - 1 sets - 5 reps - 20 seconds hold Supine Hamstring Stretch - 2 x daily - 7 x weekly - 1 sets - 5 reps - 20 seconds hold Standing Hip Hiking - 2 x daily - 7 x weekly - 1-2 sets - 10 reps - 3 seconds hold

## 2021-02-13 NOTE — Therapy (Signed)
[facility] Physical Therapy [address] [place], [place], [address] Phone: [phone]   Fax:  [phone]  Physical Therapy Treatment/Reassessment  Patient Details  Name: Luis Fernandez MRN: [id number] Date of Birth: 1946/02/26 Referring Provider (PT): Rodolph Bong MD   Encounter Date: 02/13/2021   PT End of Session - 02/13/21 1702     Visit Number 6    Number of Visits 12    Date for PT Re-Evaluation 03/06/21    Authorization Type Medicare , KX at 15    Progress Note Due on Visit 12    PT Start Time 1345    PT Stop Time 1430    PT Time Calculation (min) 45 min    Activity Tolerance Patient tolerated treatment well;No increased pain;Patient limited by fatigue    Behavior During Therapy [facility] for tasks assessed/performed             Past Medical History:  Diagnosis Date   Diabetes mellitus without complication (HCC)    Hypertension    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   Subjective Assessment - 02/13/21 1353     Subjective Luis Fernandez reports overall progress with his hip pain since starting physical therapy.  His R hip still has constant ache/pain    Limitations Sitting;House hold activities;Standing;Walking    How long can you sit comfortably? 1 hr in normal chair at home    How long can you stand comfortably? 45-60 mins    How long can you walk comfortably? 30 mins    Patient Stated Goals Return to the garden and longer walks with the dog without B hip pain    Currently in Pain? Yes    Pain Score 3     Pain Location Hip    Pain Orientation Right    Pain Descriptors / Indicators Sore;Aching    Pain Type Acute pain    Pain Radiating Towards NA    Pain Onset More than a month  ago    Pain Frequency Constant    Aggravating Factors  Prolonged postures, particularly WB    Pain Relieving Factors Exercises and change of position/rest PRN    Effect of Pain on Daily Activities Limited with garden work and is getting back into longer walks with the dog    Multiple Pain Sites No                [facility] PT Assessment - 02/13/21 0001       Observation/Other Assessments   Focus on Therapeutic Outcomes (FOTO)  65 (Goal 66 on visit 12, was 49)      ROM / Strength   AROM / PROM / Strength AROM      AROM   Overall AROM  Deficits    AROM Assessment Site Hip    Right/Left Hip Left;Right    Right Hip Flexion 95    Right Hip External Rotation  34    Right Hip Internal Rotation  2    Left Hip Flexion 95    Left Hip External Rotation  34    Left Hip Internal Rotation  4      Flexibility   Soft  Tissue Assessment /Muscle Length yes    Hamstrings 40 degrees B (was 30)                           [facility] Adult PT Treatment/Exercise - 02/13/21 0001       Exercises   Exercises Knee/Hip      Knee/Hip Exercises: Stretches   Active Hamstring Stretch Both;5 reps;20 seconds;Other (comment)    Active Hamstring Stretch Limitations Other leg straight    Hip Flexor Stretch Both;5 reps;20 seconds;Limitations    Hip Flexor Stretch Limitations single knee to chest stretch with other leg pressed into extension    Piriformis Stretch Both;5 reps;20 seconds;Limitations    Piriformis Stretch Limitations Figure 4 stretch with self-overpressure    Other Knee/Hip Stretches Gluteal stretch 5X 20 seconds (knee to opposite shoulder with other leg straight)      Knee/Hip Exercises: Standing   Other Standing Knee Exercises Standing lumbar extension AROM (hips forward) 10X 3 seconds    Other Standing Knee Exercises Alternating hip hike 2 sets of 10 for 3 seconds                    PT Education - 02/13/21 1656     Education Details Reviewed RA findings, updated  and progressed HEP.    Person(s) Educated Patient    Methods Explanation;Demonstration;Verbal cues;Handout    Comprehension Returned demonstration;Need further instruction;Verbalized understanding;Verbal cues required              PT Short Term Goals - 02/11/21 1033       PT SHORT TERM GOAL #1   Title Luis Fernandez will be able to take longer walks with his dog (10-15 minutes) without increased B hip pain.    Baseline Pt reporting mild soreness in his right hip, but he is able to walk up to 1 mile    Status Achieved               PT Long Term Goals - 02/13/21 1657       PT LONG TERM GOAL #1   Title Improve FOTO to 66.    Baseline 65, was 49    Time 4    Period Weeks    Status On-going    Target Date 03/13/21      PT LONG TERM GOAL #2   Title Improve B hip flexors flexibility to 90 degrees, hamstrings to 40 degrees, ER to 40 and IR to 5 degrees.    Baseline Improved, see objective    Time 4    Period Weeks    Status On-going    Target Date 03/13/21      PT LONG TERM GOAL #3   Title Luis Fernandez will report B hip pain consistently 0-2/10 on the Numeric Pain Rating Scale.    Baseline 2-3/10    Time 4    Period Weeks    Status On-going    Target Date 03/13/21      PT LONG TERM GOAL #4   Title Luis Fernandez will be independent with his long-term management HEP.    Time 3    Period Weeks    Status On-going    Target Date 03/06/21                   Plan - 02/13/21 1702     Clinical Impression Statement Luis Fernandez is making subjective, objective and functional progress since starting PT.  L hip pain is minimal  and R hip pain improved.  FOTO improved to 65 (was 49) and he is expected to meet all LTGs in the next 3-4 weeks with the recommended course of physical therapy.    Personal Factors and Comorbidities Comorbidity 2    Comorbidities Controlled diabetes and HTN    Examination-Activity Limitations Sit;Sleep;Bed Mobility;Bend;Lift;Squat;Stairs;Locomotion Level;Stand     Examination-Participation Restrictions Interpersonal Relationship;Yard Work;Community Activity    Stability/Clinical Decision Making Stable/Uncomplicated    Rehab Potential Good    PT Frequency 2x / week    PT Duration 4 weeks    PT Treatment/Interventions ADLs/Self Care Home Management;Moist Heat;Cryotherapy;Therapeutic activities;Stair training;Gait training;Therapeutic exercise;Balance training;Neuromuscular re-education;Patient/family education;Manual techniques    PT Next Visit Plan Hip abductors strengthening and continued hip stretching (rotation emphasis)    PT Home Exercise Plan Access Code: [email or web address]  URL: [email or web address]  Date: 01/23/2021  Prepared by: Pauletta Browns    Exercises  Standing Lumbar Extension at Wall - Forearms - 5 x daily - 7 x weekly - 1 sets - 5 reps - 3 seconds hold  Lower Trunk Rotations - 2-3 x daily - 7 x weekly - 1 sets - 20 reps - 10 seconds hold  Single Knee to Chest Stretch - 2-3 x daily - 7 x weekly - 1 sets - 5 reps - 20 seconds hold  Supine Figure 4 Piriformis Stretch - 2-3 x daily - 7 x weekly - 1 sets - 5 reps - 20 seconds hold    Consulted and Agree with Plan of Care Patient             Patient will benefit from skilled therapeutic intervention in order to improve the following deficits and impairments:  Abnormal gait, Decreased activity tolerance, Decreased endurance, Decreased range of motion, Decreased strength, Difficulty walking, Impaired flexibility, Pain  Visit Diagnosis: Difficulty in walking, not elsewhere classified  Muscle weakness (generalized)  Stiffness of left hip, not elsewhere classified  Stiffness of right hip, not elsewhere classified  Pain in left hip  Pain in right hip     Problem List Patient Active Problem List   Diagnosis Date Noted   Bilateral hip pain 01/12/2021   Recurrent sinusitis 01/10/2020   Paresthesia 06/23/2016   OSA (obstructive sleep apnea) 06/23/2016   B12 deficiency 01/21/2016    Diabetes with neurologic complications (HCC) 12/17/2015   Hyperlipidemia LDL goal <70 12/17/2015   Hypertension associated with diabetes (HCC) 12/17/2015   Overweight (BMI 25.0-29.9) 12/17/2015   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 [place], [place], [address] Phone: [phone]   Fax:  [phone]  Name: Luis Fernandez MRN: [id number] Date of Birth: [birth year]

## 2021-02-18 ENCOUNTER — Other Ambulatory Visit: Payer: Self-pay

## 2021-02-18 ENCOUNTER — Encounter: Payer: Self-pay | Admitting: Physical Therapy

## 2021-02-18 ENCOUNTER — Ambulatory Visit (INDEPENDENT_AMBULATORY_CARE_PROVIDER_SITE_OTHER): Payer: Medicare Other | Admitting: Physical Therapy

## 2021-02-18 DIAGNOSIS — M25552 Pain in left hip: Secondary | ICD-10-CM | POA: Diagnosis not present

## 2021-02-18 DIAGNOSIS — M25652 Stiffness of left hip, not elsewhere classified: Secondary | ICD-10-CM

## 2021-02-18 DIAGNOSIS — R262 Difficulty in walking, not elsewhere classified: Secondary | ICD-10-CM

## 2021-02-18 DIAGNOSIS — M6281 Muscle weakness (generalized): Secondary | ICD-10-CM

## 2021-02-18 DIAGNOSIS — M25551 Pain in right hip: Secondary | ICD-10-CM

## 2021-02-18 DIAGNOSIS — M25651 Stiffness of right hip, not elsewhere classified: Secondary | ICD-10-CM | POA: Diagnosis not present

## 2021-02-18 NOTE — Therapy (Signed)
[facility] Physical Therapy [address] [place], [place], [address] Phone: [phone]   Fax:  [phone]  Physical Therapy Treatment  Patient Details  Name: Luis Fernandez MRN: [id number] Date of Birth: 1946/04/10 Referring Provider (PT): Rodolph Bong MD   Encounter Date: 02/18/2021   PT End of Session - 02/18/21 1040     Visit Number 7    Number of Visits 12    Date for PT Re-Evaluation 03/06/21    Authorization Type Medicare , KX at 15    Progress Note Due on Visit 12    PT Start Time 1015    PT Stop Time 1055    PT Time Calculation (min) 40 min    Activity Tolerance Patient tolerated treatment well;No increased pain;Patient limited by fatigue    Behavior During Therapy [facility] for tasks assessed/performed             Past Medical History:  Diagnosis Date   Diabetes mellitus without complication (HCC)    Hypertension    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   Subjective Assessment - 02/18/21 1021     Subjective Pt arriving to therapy today reporting 3/10 pain in right hip. Pt stating pain is more on the side.    Pertinent History Controlled diabetes and HTN    Limitations Sitting;House hold activities;Standing;Walking    Patient Stated Goals Return to the garden and longer walks with the dog without B hip pain    Currently in Pain? Yes    Pain Score 3     Pain Location Hip    Pain Orientation Right    Pain Descriptors / Indicators Aching;Sore    Pain Type Acute pain    Pain Onset More than a month ago    Pain Frequency Constant                               [facility] Adult PT Treatment/Exercise - 02/18/21 0001       Exercises   Exercises Knee/Hip       Knee/Hip Exercises: Stretches   Active Hamstring Stretch Both;5 reps;20 seconds;Other (comment)    Active Hamstring Stretch Limitations Other leg straight    Hip Flexor Stretch Both;5 reps;20 seconds;Limitations    Hip Flexor Stretch Limitations lung position in standing    Piriformis Stretch Both;5 reps;20 seconds;Limitations    Piriformis Stretch Limitations Figure 4 stretch with self-overpressure      Knee/Hip Exercises: Aerobic   Recumbent Bike L5 x 7 minutes      Knee/Hip Exercises: Machines for Strengthening   Cybex Leg Press 112# bilateral LE' 2x15, R LE only: 56# 2x10      Knee/Hip Exercises: Supine   Bridges with Clamshell 2 sets;Both;Strengthening;10 reps      Knee/Hip Exercises: Prone   Straight Leg Raises Strengthening;Both;10 reps                      PT Short Term Goals - 02/18/21 1036       PT SHORT TERM GOAL #1   Title Luis Fernandez will be able to take longer  walks with his dog (10-15 minutes) without increased B hip pain.    Status Achieved               PT Long Term Goals - 02/18/21 1038       PT LONG TERM GOAL #1   Title Improve FOTO to 66.    Status On-going      PT LONG TERM GOAL #2   Title Improve B hip flexors flexibility to 90 degrees, hamstrings to 40 degrees, ER to 40 and IR to 5 degrees.    Status On-going      PT LONG TERM GOAL #3   Title Luis Fernandez will report B hip pain consistently 0-2/10 on the Numeric Pain Rating Scale.    Status On-going      PT LONG TERM GOAL #4   Title Luis Fernandez will be independent with his long-term management HEP.    Status On-going                   Plan - 02/18/21 1031     Clinical Impression Statement Pt arriving today reporting 3/10 pain in right hip. Pt stating he is considering injection in the right hip. Pt making progress with LE strength. Pt reporting that riding the bike and performing leg press decreases his pain. Continue skilled PT to maximize function.    Personal Factors and  Comorbidities Comorbidity 2    Comorbidities Controlled diabetes and HTN    Examination-Activity Limitations Sit;Sleep;Bed Mobility;Bend;Lift;Squat;Stairs;Locomotion Level;Stand    Examination-Participation Restrictions Interpersonal Relationship;Yard Work;Community Activity    Stability/Clinical Decision Making Stable/Uncomplicated    Rehab Potential Good    PT Frequency 2x / week    PT Duration 4 weeks    PT Treatment/Interventions ADLs/Self Care Home Management;Moist Heat;Cryotherapy;Therapeutic activities;Stair training;Gait training;Therapeutic exercise;Balance training;Neuromuscular re-education;Patient/family education;Manual techniques    PT Next Visit Plan Bike and Leg Press, Hip abductors strengthening and continued hip stretching (rotation emphasis)    PT Home Exercise Plan Access Code: [email or web address]  URL: [email or web address]  Date: 01/23/2021  Prepared by: Pauletta Browns    Exercises  Standing Lumbar Extension at Wall - Forearms - 5 x daily - 7 x weekly - 1 sets - 5 reps - 3 seconds hold  Lower Trunk Rotations - 2-3 x daily - 7 x weekly - 1 sets - 20 reps - 10 seconds hold  Single Knee to Chest Stretch - 2-3 x daily - 7 x weekly - 1 sets - 5 reps - 20 seconds hold  Supine Figure 4 Piriformis Stretch - 2-3 x daily - 7 x weekly - 1 sets - 5 reps - 20 seconds hold    Consulted and Agree with Plan of Care Patient             Patient will benefit from skilled therapeutic intervention in order to improve the following deficits and impairments:  Abnormal gait, Decreased activity tolerance, Decreased endurance, Decreased range of motion, Decreased strength, Difficulty walking, Impaired flexibility, Pain  Visit Diagnosis: Difficulty in walking, not elsewhere classified  Muscle weakness (generalized)  Stiffness of left hip, not elsewhere classified  Stiffness of right hip, not elsewhere classified  Pain in left hip  Pain in right hip     Problem List Patient Active  Problem List   Diagnosis Date Noted   Bilateral hip pain 01/12/2021   Recurrent sinusitis 01/10/2020   Paresthesia 06/23/2016   OSA (obstructive sleep apnea) 06/23/2016   B12 deficiency 01/21/2016   Diabetes with neurologic complications (HCC) 12/17/2015  Hyperlipidemia LDL goal <70 12/17/2015   Hypertension associated with diabetes (HCC) 12/17/2015   Overweight (BMI 25.0-29.9) 12/17/2015   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 [place], [place], [address] Phone: [phone]   Fax:  [phone]  Name: Maveryk Renstrom MRN: [id number] Date of Birth: [birth year]

## 2021-02-20 ENCOUNTER — Other Ambulatory Visit: Payer: Self-pay

## 2021-02-20 ENCOUNTER — Encounter: Payer: Self-pay | Admitting: Rehabilitative and Restorative Service Providers"

## 2021-02-20 ENCOUNTER — Ambulatory Visit (INDEPENDENT_AMBULATORY_CARE_PROVIDER_SITE_OTHER): Payer: Medicare Other | Admitting: Rehabilitative and Restorative Service Providers"

## 2021-02-20 DIAGNOSIS — M6281 Muscle weakness (generalized): Secondary | ICD-10-CM

## 2021-02-20 DIAGNOSIS — R262 Difficulty in walking, not elsewhere classified: Secondary | ICD-10-CM

## 2021-02-20 DIAGNOSIS — M25651 Stiffness of right hip, not elsewhere classified: Secondary | ICD-10-CM

## 2021-02-20 DIAGNOSIS — M25652 Stiffness of left hip, not elsewhere classified: Secondary | ICD-10-CM | POA: Diagnosis not present

## 2021-02-20 DIAGNOSIS — M25551 Pain in right hip: Secondary | ICD-10-CM

## 2021-02-20 NOTE — Therapy (Signed)
[facility] [address] [place], [place], [address] Phone: [phone]   Fax:  [phone]  Physical Therapy Treatment  Patient Details  Name: Luis Fernandez MRN: [id number] Date of Birth: October 22, 1945 Referring Provider (PT): Rodolph Bong MD   Encounter Date: 02/20/2021   PT End of Session - 02/20/21 1114     Visit Number 8    Number of Visits 12    Date for PT Re-Evaluation 03/06/21    Authorization Type Medicare , KX at 15    Progress Note Due on Visit 12    PT Start Time 1015    PT Stop Time 1100    PT Time Calculation (min) 45 min    Activity Tolerance Patient tolerated treatment well;No increased pain;Patient limited by fatigue    Behavior During Therapy [facility] for tasks assessed/performed             Past Medical History:  Diagnosis Date   Diabetes mellitus without complication (HCC)    Hypertension    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   Subjective Assessment - 02/20/21 1109     Subjective Luis Fernandez reports his L hip is "almost normal."  Although improved, his R hip still limits standing and walking endurance.    Pertinent History Controlled diabetes and HTN    Limitations Sitting;House hold activities;Standing;Walking    How long can you sit comfortably? 1 hr in normal chair at home    How long can you stand comfortably? 45-60 mins    How long can you walk comfortably? 30 mins    Patient Stated Goals Return to the garden and longer walks with the dog without B hip pain    Currently in Pain? Yes    Pain Score 3     Pain Location Hip    Pain Orientation Right    Pain Descriptors / Indicators Aching;Tightness;Sore    Pain Radiating Towards NA    Pain Onset  More than a month ago    Pain Frequency Constant    Aggravating Factors  Too much WB    Pain Relieving Factors Exercises and change of position, rest PRN    Effect of Pain on Daily Activities Limited with garden work and is taking short walks with the dog, would like to take longer walks    Multiple Pain Sites No                               [facility] Adult PT Treatment/Exercise - 02/20/21 0001       Neuro Re-ed    Neuro Re-ed Details  Tandem balance eyes open 5X 20 seconds      Exercises   Exercises Knee/Hip      Knee/Hip Exercises: Stretches   Active Hamstring Stretch Both;5 reps;20 seconds;Other (comment)    Active Hamstring Stretch Limitations Other leg straight    Hip Flexor Stretch Both;5 reps;20 seconds;Limitations    Hip Flexor Stretch Limitations single knee to chest stretch with other leg pressed into extension    Piriformis Stretch Both;5 reps;20 seconds;Limitations    Piriformis Stretch Limitations Figure 4  stretch with self-overpressure    Other Knee/Hip Stretches Gluteal stretch 5X 20 seconds (knee to opposite shoulder with other leg straight)      Knee/Hip Exercises: Standing   Other Standing Knee Exercises Standing lumbar extension AROM (hips forward) 10X 3 seconds    Other Standing Knee Exercises Alternating hip hike 2 sets of 10 for 3 seconds and hip abduction with pelvic stabilization 2 sets of 5 for 3 seconds                    PT Education - 02/20/21 1112     Education Details Reviewed and progressed HEP with emphasis on R hip abductors strength.    Person(s) Educated Patient    Methods Explanation;Demonstration;Tactile cues;Verbal cues;Handout    Comprehension Verbal cues required;Returned demonstration;Need further instruction;Tactile cues required;Verbalized understanding              PT Short Term Goals - 02/18/21 1036       PT SHORT TERM GOAL #1   Title Luis Fernandez will be able to take longer walks with his dog (10-15  minutes) without increased B hip pain.    Status Achieved               PT Long Term Goals - 02/20/21 1113       PT LONG TERM GOAL #1   Title Improve FOTO to 66.    Baseline 65, was 49    Time 3    Period Weeks    Status On-going    Target Date 03/06/21      PT LONG TERM GOAL #2   Title Improve B hip flexors flexibility to 90 degrees, hamstrings to 40 degrees, ER to 40 and IR to 5 degrees.    Baseline Improved, see objective    Time 3    Period Weeks    Status On-going    Target Date 03/06/21      PT LONG TERM GOAL #3   Title Luis Fernandez will report B hip pain consistently 0-2/10 on the Numeric Pain Rating Scale.    Baseline 0-3/10    Time 3    Period Weeks    Status On-going    Target Date 03/06/21      PT LONG TERM GOAL #4   Title Luis Fernandez will be independent with his long-term management HEP.    Time 3    Period Weeks    Status On-going    Target Date 03/06/21                   Plan - 02/20/21 1114     Clinical Impression Statement Luis Fernandez is making continued progress towards LTG.  His L hip is "nearly normal" while his R hip is making slow but significant progress.  Continued HEP compliance and strength progressions should allow Luis Fernandez to meet LTG by 03/06/2021.    Personal Factors and Comorbidities Comorbidity 2    Comorbidities Controlled diabetes and HTN    Examination-Activity Limitations Sit;Sleep;Bed Mobility;Bend;Lift;Squat;Stairs;Locomotion Level;Stand    Examination-Participation Restrictions Interpersonal Relationship;Yard Work;Community Activity    Stability/Clinical Decision Making Stable/Uncomplicated    Rehab Potential Good    PT Frequency 2x / week    PT Duration 3 weeks    PT Treatment/Interventions ADLs/Self Care Home Management;Moist Heat;Cryotherapy;Therapeutic activities;Stair training;Gait training;Therapeutic exercise;Balance training;Neuromuscular re-education;Patient/family education;Manual techniques    PT Next Visit Plan Hip abductors  strengthening and continued hip stretching (rotation emphasis)    PT Home Exercise Plan Access Code: [email or web address]  URL: [email or web address]  Date: 02/20/2021  Prepared by: Pauletta Browns    Exercises  Standing Lumbar Extension at Wall - Forearms - 5 x daily - 7 x weekly - 1 sets - 5 reps - 3 seconds hold  Lower Trunk Rotations - 1 x daily - 7 x weekly - 1 sets - 20 reps - 10 seconds hold  Single Knee to Chest Stretch - 2 x daily - 7 x weekly - 1 sets - 5 reps - 20 seconds hold  Supine Figure 4 Piriformis Stretch - 3 x daily - 7 x weekly - 1 sets - 5 reps - 20 seconds hold  Supine Gluteus Stretch - 3 x daily - 7 x weekly - 1 sets - 5 reps - 20 seconds hold  Supine Hamstring Stretch - 2 x daily - 7 x weekly - 1 sets - 5 reps - 20 seconds hold  Standing Hip Hiking - 2 x daily - 7 x weekly - 2 sets - 10 reps - 3 seconds hold  Tandem Stance - 1-2 x daily - 7 x weekly - 1 sets - 5 reps - 20 second hold    Consulted and Agree with Plan of Care Patient             Patient will benefit from skilled therapeutic intervention in order to improve the following deficits and impairments:  Abnormal gait, Decreased activity tolerance, Decreased endurance, Decreased range of motion, Decreased strength, Difficulty walking, Impaired flexibility, Pain  Visit Diagnosis: Difficulty in walking, not elsewhere classified  Muscle weakness (generalized)  Stiffness of left hip, not elsewhere classified  Stiffness of right hip, not elsewhere classified  Pain in right hip     Problem List Patient Active Problem List   Diagnosis Date Noted   Bilateral hip pain 01/12/2021   Recurrent sinusitis 01/10/2020   Paresthesia 06/23/2016   OSA (obstructive sleep apnea) 06/23/2016   B12 deficiency 01/21/2016   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

## 2021-02-20 NOTE — Patient Instructions (Signed)
Access Code: [email or web address] URL: [email or web address] Date: 02/20/2021 Prepared by: Pauletta Browns  Exercises Standing Lumbar Extension at Wall - Forearms - 5 x daily - 7 x weekly - 1 sets - 5 reps - 3 seconds hold Lower Trunk Rotations - 1 x daily - 7 x weekly - 1 sets - 20 reps - 10 seconds hold Single Knee to Chest Stretch - 2 x daily - 7 x weekly - 1 sets - 5 reps - 20 seconds hold Supine Figure 4 Piriformis Stretch - 3 x daily - 7 x weekly - 1 sets - 5 reps - 20 seconds hold Supine Gluteus Stretch - 3 x daily - 7 x weekly - 1 sets - 5 reps - 20 seconds hold Supine Hamstring Stretch - 2 x daily - 7 x weekly - 1 sets - 5 reps - 20 seconds hold Standing Hip Hiking - 2 x daily - 7 x weekly - 2 sets - 10 reps - 3 seconds hold Tandem Stance - 1-2 x daily - 7 x weekly - 1 sets - 5 reps - 20 second hold

## 2021-02-25 ENCOUNTER — Other Ambulatory Visit: Payer: Self-pay

## 2021-02-25 ENCOUNTER — Ambulatory Visit (INDEPENDENT_AMBULATORY_CARE_PROVIDER_SITE_OTHER): Payer: Medicare Other | Admitting: Physical Therapy

## 2021-02-25 ENCOUNTER — Encounter: Payer: Self-pay | Admitting: Physical Therapy

## 2021-02-25 DIAGNOSIS — M6281 Muscle weakness (generalized): Secondary | ICD-10-CM | POA: Diagnosis not present

## 2021-02-25 DIAGNOSIS — R262 Difficulty in walking, not elsewhere classified: Secondary | ICD-10-CM | POA: Diagnosis not present

## 2021-02-25 DIAGNOSIS — M25552 Pain in left hip: Secondary | ICD-10-CM

## 2021-02-25 DIAGNOSIS — M25651 Stiffness of right hip, not elsewhere classified: Secondary | ICD-10-CM | POA: Diagnosis not present

## 2021-02-25 DIAGNOSIS — M25551 Pain in right hip: Secondary | ICD-10-CM

## 2021-02-25 DIAGNOSIS — M25652 Stiffness of left hip, not elsewhere classified: Secondary | ICD-10-CM | POA: Diagnosis not present

## 2021-02-25 NOTE — Therapy (Signed)
[facility] Physical Therapy [address] [place], [place], [address] Phone: [phone]   Fax:  [phone]  Physical Therapy Treatment  Patient Details  Name: Luis Fernandez MRN: [id number] Date of Birth: Oct 22, 1945 Referring Provider (PT): Lynne Leader, MD   Encounter Date: 02/25/2021   PT End of Session - 02/25/21 1039     Visit Number 9    Number of Visits 12    Date for PT Re-Evaluation 03/06/21    Authorization Type Medicare , KX at 15    Progress Note Due on Visit 12    PT Start Time 1017    PT Stop Time 1058    PT Time Calculation (min) 41 min    Activity Tolerance Patient tolerated treatment well;No increased pain;Patient limited by fatigue    Behavior During Therapy [facility] for tasks assessed/performed             Past Medical History:  Diagnosis Date   Diabetes mellitus without complication ([place])    Hypertension    Steroid-induced adrenal suppression ([place]) 01/21/2016   On medical reccords     Past Surgical History:  Procedure Laterality Date   CARPAL TUNNEL RELEASE     CATARACT EXTRACTION     NOSE SURGERY     TOTAL KNEE ARTHROPLASTY     UMBILICAL HERNIA REPAIR      There were no vitals filed for this visit.   Subjective Assessment - 02/25/21 1022     Subjective Pt arriving stating his right hip is 2/10 more nagging pain. Pt stating his left hip is 1/10. Pt reporting periodically a jolt of pain in his hips that is short but pain intensity can be higher than a 2/10.    Pertinent History Controlled diabetes and HTN    Limitations Sitting;House hold activities;Standing;Walking    How long can you sit comfortably? 1 hr in normal chair at home    How long can you stand comfortably? 45-60 mins    How long can you walk comfortably? 30 mins    Patient Stated Goals Return to the garden and longer walks with the dog without B hip pain    Currently in Pain? Yes    Pain Score 2     Pain Location Hip    Pain Orientation Right    Pain  Descriptors / Indicators Sore;Aching    Pain Type Acute pain    Pain Onset More than a month ago                [facility] PT Assessment - 02/25/21 0001       Assessment   Medical Diagnosis B trochanteric bursitis    Referring Provider (PT) Lynne Leader, MD      AROM   Overall AROM Comments measured in supine    AROM Assessment Site Hip    Right/Left Hip Right;Left    Right Hip Flexion --   120 degrees with UE assist   Left Hip Flexion --   120 degees with UE assist     Flexibility   Hamstrings 70 degrees bilaterally with opposite knee bent                           [facility] Adult PT Treatment/Exercise - 02/25/21 0001       Exercises   Exercises Knee/Hip      Knee/Hip Exercises: Aerobic   Nustep L6 x 6 minutes      Knee/Hip Exercises: Machines for Strengthening  Cybex Leg Press 125# 2x15, single LE performed on both legs 2x15 81#      Knee/Hip Exercises: Standing   Other Standing Knee Exercises side stepping with green theraband around knees 20 feet x 4      Knee/Hip Exercises: Sidelying   Hip ABduction Strengthening;Both;2 sets;10 reps    Hip ABduction Limitations toe pointing straight ahead and then 2 sets of 10 with toes pointed downward      Knee/Hip Exercises: Prone   Straight Leg Raises Strengthening;Both;2 sets;10 reps                      PT Short Term Goals - 02/25/21 1052       PT SHORT TERM GOAL #1   Title [name] will be able to take longer walks with his dog (10-15 minutes) without increased B hip pain.    Status Achieved               PT Long Term Goals - 02/25/21 1053       PT LONG TERM GOAL #1   Title Improve FOTO to 66.    Status On-going      PT LONG TERM GOAL #2   Title Improve B hip flexors flexibility to 90 degrees, hamstrings to 40 degrees, ER to 40 and IR to 5 degrees.    Baseline hip flexors 120 degrees bilaterally, hamstring 70 degrees bilaterally    Status Partially Met      PT LONG TERM GOAL #3    Title [name] will report B hip pain consistently 0-2/10 on the Numeric Pain Rating Scale.    Status On-going      PT LONG TERM GOAL #4   Title [name] will be independent with his long-term management HEP.    Status On-going                   Plan - 02/25/21 1048     Clinical Impression Statement Pt arriving today reporting right hip was worse than left with 2/10 pain reported. Pt with good knowledge of his HEP and reporting consistance compliance. Pt still with weakness noted in gluteals and hip abductors. Continue skilled PT interventions to progress toward LTG's and improved functional mobility.    Personal Factors and Comorbidities Comorbidity 2    Comorbidities Controlled diabetes and HTN    Examination-Activity Limitations Sit;Sleep;Bed Mobility;Bend;Lift;Squat;Stairs;Locomotion Level;Stand    Examination-Participation Restrictions Interpersonal Relationship;Yard Work;Community Activity    Stability/Clinical Decision Making Stable/Uncomplicated    Rehab Potential Good    PT Frequency 2x / week    PT Treatment/Interventions ADLs/Self Care Home Management;Moist Heat;Cryotherapy;Therapeutic activities;Stair training;Gait training;Therapeutic exercise;Balance training;Neuromuscular re-education;Patient/family education;Manual techniques    PT Next Visit Plan Hip abductors strengthening and continued hip stretching (rotation emphasis)    PT Home Exercise Plan Access Code: [email or web address]  URL: [email or web address]  Date: 02/20/2021  Prepared by: Vista Mink    Exercises  Standing Lumbar Extension at [place] - 5 x daily - 7 x weekly - 1 sets - 5 reps - 3 seconds hold  Lower Trunk Rotations - 1 x daily - 7 x weekly - 1 sets - 20 reps - 10 seconds hold  Single Knee to Chest Stretch - 2 x daily - 7 x weekly - 1 sets - 5 reps - 20 seconds hold  Supine Figure 4 Piriformis Stretch - 3 x daily - 7 x weekly - 1 sets - 5 reps - 20 seconds hold  Supine Gluteus Stretch -  3 x  daily - 7 x weekly - 1 sets - 5 reps - 20 seconds hold  Supine Hamstring Stretch - 2 x daily - 7 x weekly - 1 sets - 5 reps - 20 seconds hold  Standing Hip Hiking - 2 x daily - 7 x weekly - 2 sets - 10 reps - 3 seconds hold  Tandem Stance - 1-2 x daily - 7 x weekly - 1 sets - 5 reps - 20 second hold    Consulted and Agree with Plan of Care Patient             Patient will benefit from skilled therapeutic intervention in order to improve the following deficits and impairments:  Abnormal gait, Decreased activity tolerance, Decreased endurance, Decreased range of motion, Decreased strength, Difficulty walking, Impaired flexibility, Pain  Visit Diagnosis: Difficulty in walking, not elsewhere classified  Muscle weakness (generalized)  Stiffness of left hip, not elsewhere classified  Stiffness of right hip, not elsewhere classified  Pain in right hip  Pain in left hip     Problem List Patient Active Problem List   Diagnosis Date Noted   Bilateral hip pain 01/12/2021   Recurrent sinusitis 01/10/2020   Paresthesia 06/23/2016   OSA (obstructive sleep apnea) 06/23/2016   B12 deficiency 01/21/2016   Diabetes with neurologic complications ([place]) 77/41/2878   Hyperlipidemia LDL goal <70 12/17/2015   Hypertension associated with diabetes ([place]) 12/17/2015   Overweight (BMI 25.0-29.9) 12/17/2015   Status post total left knee replacement 12/17/2015   Insomnia 12/17/2015    Oretha Caprice, PT, MPT 02/25/2021, 11:07 AM  [facility] Physical Therapy [address] [place], [place], [address] Phone: [phone]   Fax:  [phone]  Name: Elyon Zoll MRN: [id number] Date of Birth: [birth year]

## 2021-02-27 ENCOUNTER — Encounter: Payer: Self-pay | Admitting: Rehabilitative and Restorative Service Providers"

## 2021-02-27 ENCOUNTER — Other Ambulatory Visit: Payer: Self-pay

## 2021-02-27 ENCOUNTER — Ambulatory Visit (INDEPENDENT_AMBULATORY_CARE_PROVIDER_SITE_OTHER): Payer: Medicare Other | Admitting: Rehabilitative and Restorative Service Providers"

## 2021-02-27 DIAGNOSIS — M25551 Pain in right hip: Secondary | ICD-10-CM

## 2021-02-27 DIAGNOSIS — M25651 Stiffness of right hip, not elsewhere classified: Secondary | ICD-10-CM

## 2021-02-27 DIAGNOSIS — R262 Difficulty in walking, not elsewhere classified: Secondary | ICD-10-CM | POA: Diagnosis not present

## 2021-02-27 DIAGNOSIS — M6281 Muscle weakness (generalized): Secondary | ICD-10-CM | POA: Diagnosis not present

## 2021-02-27 DIAGNOSIS — M25652 Stiffness of left hip, not elsewhere classified: Secondary | ICD-10-CM

## 2021-02-27 NOTE — Patient Instructions (Signed)
Access Code: [email or web address] URL: [email or web address] Date: 02/27/2021 Prepared by: Pauletta Browns  Exercises Standing Lumbar Extension at Wall - Forearms - 5 x daily - 7 x weekly - 1 sets - 5 reps - 3 seconds hold Lower Trunk Rotations - 1 x daily - 7 x weekly - 1 sets - 5-20 reps - 10-20 seconds hold Single Knee to Chest Stretch - 1 x daily - 7 x weekly - 1 sets - 5 reps - 20 seconds hold Supine Figure 4 Piriformis Stretch - 1 x daily - 7 x weekly - 1 sets - 5 reps - 20 seconds hold Supine Gluteus Stretch - 1 x daily - 7 x weekly - 1 sets - 5 reps - 20 seconds hold Supine Hamstring Stretch - 1 x daily - 7 x weekly - 1 sets - 5 reps - 20 seconds hold Standing Hip Hiking - 2 x daily - 3-4 x weekly - 2 sets - 10 reps - 3 seconds hold Tandem Stance - 1 x daily - 7 x weekly - 1 sets - 5 reps - 20 second hold Prone Hip Extension - 1 x daily - 3-4 x weekly - 2 sets - 10 reps - 3 seconds hold

## 2021-02-27 NOTE — Therapy (Signed)
[facility] [place], [place], [address] Phone: [phone]   Fax:  [phone]  Physical Therapy Treatment/Reassessment  Patient Details  Name: Lucas Winograd MRN: [id number] Date of Birth: 1946/02/08 Referring Provider (PT): Lynne Leader, MD   Encounter Date: 02/27/2021   PT End of Session - 02/27/21 1115     Visit Number 10    Number of Visits 12    Date for PT Re-Evaluation 03/06/21    Authorization Type Medicare , KX at 15    Progress Note Due on Visit 12    PT Start Time 1017    PT Stop Time 1102    PT Time Calculation (min) 45 min    Activity Tolerance Patient tolerated treatment well;No increased pain    Behavior During Therapy WFL for tasks assessed/performed             Past Medical History:  Diagnosis Date   Diabetes mellitus without complication ([place])    Hypertension    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   Subjective Assessment - 02/27/21 1111     Subjective [name] notes significant progress since starting PT.  His L hip is no longer a problem where his R hip is more of a "1-2/10" pain.    Pertinent History Controlled diabetes and HTN    Limitations Sitting;House hold activities;Standing;Walking    How long can you sit comfortably? 1 hr in normal chair at home    How long can you stand comfortably? 45-60 mins    How long can you walk comfortably? 30 mins    Patient Stated Goals Return to the garden and longer walks with the dog without B hip pain    Currently in Pain? Yes    Pain Score 1     Pain Location Hip    Pain Orientation Right    Pain Descriptors / Indicators Aching    Pain Type Acute pain    Pain Radiating Towards NA    Pain  Onset More than a month ago    Pain Frequency Intermittent    Aggravating Factors  Overuse and too much WB    Pain Relieving Factors Exercises and rest    Effect of Pain on Daily Activities Is gradually increasing WB function and increasing time with walks with the dog    Multiple Pain Sites No                [facility] PT Assessment - 02/27/21 0001       ROM / Strength   AROM / PROM / Strength AROM      AROM   Overall AROM  Deficits    Overall AROM Comments Supine    AROM Assessment Site Hip    Right/Left Hip Left;Right    Right Hip Flexion 100    Right Hip External Rotation  42    Right Hip Internal Rotation  2    Left Hip Flexion 100    Left Hip External Rotation  45    Left Hip Internal Rotation  4      Flexibility   Hamstrings 45 degrees B (was 30)  [facility] PT Treatment/Exercise - 02/27/21 0001       Neuro Re-ed    Neuro Re-ed Details  Tandem balance eyes open 5X 20 seconds      Exercises   Exercises Knee/Hip      Knee/Hip Exercises: Stretches   Active Hamstring Stretch Both;2 reps;20 seconds;Other (comment)    Active Hamstring Stretch Limitations Other leg straight    Hip Flexor Stretch Both;2 reps;20 seconds;Limitations    Hip Flexor Stretch Limitations single knee to chest stretch with other leg pressed into extension    Piriformis Stretch Both;2 reps;20 seconds;Limitations    Piriformis Stretch Limitations Figure 4 stretch with self-overpressure    Other Knee/Hip Stretches Gluteal stretch 2X 20 seconds (knee to opposite shoulder with other leg straight)      Knee/Hip Exercises: Standing   Other Standing Knee Exercises Standing lumbar extension AROM (hips forward) 10X 3 seconds    Other Standing Knee Exercises Alternating hip hike 2 sets of 10 for 3 seconds and hip abduction with pelvic stabilization 2 sets of 5 for 3 seconds      Knee/Hip Exercises: Sidelying   Hip ABduction Strengthening;Right;10  reps;Limitations    Hip ABduction Limitations 3 second hold 1/4 turn to stomach      Knee/Hip Exercises: Prone   Straight Leg Raises Strengthening;Both;1 set;10 reps;Limitations    Straight Leg Raises Limitations 3 seconds                    PT Education - 02/27/21 1113     Education Details Reviewed HEP and updated HEP for long-term compliance.  Reviewed RA results.    Person(s) Educated Patient    Methods Explanation;Demonstration;Tactile cues;Verbal cues;Handout    Comprehension Verbal cues required;Need further instruction;Verbalized understanding;Tactile cues required;Returned demonstration              PT Short Term Goals - 02/25/21 1052       PT SHORT TERM GOAL #1   Title [name] will be able to take longer walks with his dog (10-15 minutes) without increased B hip pain.    Status Achieved               PT Long Term Goals - 02/27/21 1114       PT LONG TERM GOAL #1   Title Improve FOTO to 66.    Baseline 67, was 49    Status Achieved      PT LONG TERM GOAL #2   Title Improve B hip flexors flexibility to 90 degrees, hamstrings to 40 degrees, ER to 40 and IR to 5 degrees.    Baseline Met all but hip IR    Status Partially Met    Target Date 03/06/21      PT LONG TERM GOAL #3   Title [name] will report B hip pain consistently 0-2/10 on the Numeric Pain Rating Scale.    Status Achieved      PT LONG TERM GOAL #4   Title [name] will be independent with his long-term management HEP.    Status Achieved                   Plan - 02/27/21 1116     Clinical Impression Statement [name] has made great progress with his supervised PT.  Other than hip IR AROM impairments and B hip abductors strength, [name] has met all long-term goals.  FOTO shows excellent functional progress.  He is walking faster and has less limitations.  Remaining mild  impairments are being addressed with his updated HEP.  [name] will have a week of independent PT to gain  confidence with this program with likely transfer into independent PT in a week.    Personal Factors and Comorbidities Comorbidity 2    Comorbidities Controlled diabetes and HTN    Examination-Activity Limitations Sit;Sleep;Bed Mobility;Bend;Lift;Squat;Stairs;Locomotion Level;Stand    Examination-Participation Restrictions Interpersonal Relationship;Yard Work;Community Activity    Stability/Clinical Decision Making Stable/Uncomplicated    Rehab Potential Good    PT Frequency 1x / week    PT Duration Other (comment)   1 week   PT Treatment/Interventions ADLs/Self Care Home Management;Moist Heat;Cryotherapy;Therapeutic activities;Stair training;Gait training;Therapeutic exercise;Balance training;Neuromuscular re-education;Patient/family education;Manual techniques    PT Next Visit Plan Review long-term HEP with hip abductors strength emphasis    PT Home Exercise Plan Access Code: [email or web address]  URL: [email or web address]  Date: 02/27/2021  Prepared by: Vista Mink    Exercises  Standing Lumbar Extension at [place] - 5 x daily - 7 x weekly - 1 sets - 5 reps - 3 seconds hold  Lower Trunk Rotations - 1 x daily - 7 x weekly - 1 sets - 5-20 reps - 10-20 seconds hold  Single Knee to Chest Stretch - 1 x daily - 7 x weekly - 1 sets - 5 reps - 20 seconds hold  Supine Figure 4 Piriformis Stretch - 1 x daily - 7 x weekly - 1 sets - 5 reps - 20 seconds hold  Supine Gluteus Stretch - 1 x daily - 7 x weekly - 1 sets - 5 reps - 20 seconds hold  Supine Hamstring Stretch - 1 x daily - 7 x weekly - 1 sets - 5 reps - 20 seconds hold  Standing Hip Hiking - 2 x daily - 3-4 x weekly - 2 sets - 10 reps - 3 seconds hold  Tandem Stance - 1 x daily - 7 x weekly - 1 sets - 5 reps - 20 second hold  Prone Hip Extension - 1 x daily - 3-4 x weekly - 2 sets - 10 reps - 3 seconds hold    Consulted and Agree with Plan of Care Patient             Patient will benefit from skilled therapeutic intervention in order  to improve the following deficits and impairments:  Abnormal gait, Decreased activity tolerance, Decreased endurance, Decreased range of motion, Decreased strength, Difficulty walking, Impaired flexibility, Pain  Visit Diagnosis: Difficulty in walking, not elsewhere classified  Muscle weakness (generalized)  Stiffness of left hip, not elsewhere classified  Stiffness of right hip, not elsewhere classified  Pain in right hip     Problem List Patient Active Problem List   Diagnosis Date Noted   Bilateral hip pain 01/12/2021   Recurrent sinusitis 01/10/2020   Paresthesia 06/23/2016   OSA (obstructive sleep apnea) 06/23/2016   B12 deficiency 01/21/2016   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 [place], [place], [address] Phone: [phone]   Fax:  [phone]  Name: Jaleil Renwick MRN: [id number] Date of Birth: [birth year]

## 2021-03-05 ENCOUNTER — Encounter: Payer: Self-pay | Admitting: Rehabilitative and Restorative Service Providers"

## 2021-03-05 ENCOUNTER — Ambulatory Visit (INDEPENDENT_AMBULATORY_CARE_PROVIDER_SITE_OTHER): Payer: Medicare Other | Admitting: Rehabilitative and Restorative Service Providers"

## 2021-03-05 ENCOUNTER — Other Ambulatory Visit: Payer: Self-pay

## 2021-03-05 DIAGNOSIS — M25551 Pain in right hip: Secondary | ICD-10-CM | POA: Diagnosis not present

## 2021-03-05 DIAGNOSIS — M25651 Stiffness of right hip, not elsewhere classified: Secondary | ICD-10-CM | POA: Diagnosis not present

## 2021-03-05 DIAGNOSIS — M25652 Stiffness of left hip, not elsewhere classified: Secondary | ICD-10-CM

## 2021-03-05 DIAGNOSIS — R262 Difficulty in walking, not elsewhere classified: Secondary | ICD-10-CM | POA: Diagnosis not present

## 2021-03-05 DIAGNOSIS — M6281 Muscle weakness (generalized): Secondary | ICD-10-CM | POA: Diagnosis not present

## 2021-03-05 NOTE — Therapy (Signed)
[facility] [address] [place], [place], [address] Phone: [phone]   Fax:  [phone]  Physical Therapy Treatment  Patient Details  Name: Luis Fernandez MRN: [id number] Date of Birth: Aug 16, 1945 Referring Provider (PT): Lynne Leader, MD  PHYSICAL THERAPY DISCHARGE SUMMARY  Visits from Start of Care: 11  Current functional level related to goals / functional outcomes: See note   Remaining deficits: Minimal-see note   Education / Equipment: Updated HEP   Patient agrees to discharge. Patient goals were met. Patient is being discharged due to meeting the stated rehab goals.  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   Subjective Assessment - 03/05/21 1026     Subjective Luis Fernandez notes some increased R hip soreness after increasing his gardening activities over the weekend.  Walks with the dogs are slowly getting easier (better endurance, less pain).    Pertinent History Controlled diabetes and HTN    Limitations Sitting;House hold activities;Standing;Walking    How long can you sit comfortably? 1 hr in normal chair at home     How long can you stand comfortably? 45-60 mins    How long can you walk comfortably? 30 mins    Patient Stated Goals Return to the garden and longer walks with the dog without B hip pain    Currently in Pain? Yes    Pain Score 2     Pain Location Hip    Pain Orientation Right    Pain Descriptors / Indicators Aching;Sore    Pain Type Acute pain    Pain Radiating Towards NA    Pain Onset More than a month ago    Pain Frequency Intermittent    Aggravating Factors  Overuse with too much WB    Pain Relieving Factors Exercises and rest    Effect of Pain on Daily Activities Gradually increasing walks with the dog and time in the garden    Multiple Pain Sites No                               [facility] Adult PT Treatment/Exercise - 03/05/21 0001       Neuro Re-ed    Neuro Re-ed Details  --      Exercises   Exercises Knee/Hip      Knee/Hip Exercises: Stretches   Active Hamstring Stretch Both;2 reps;20 seconds;Other (comment)  Active Hamstring Stretch Limitations Other leg straight    Hip Flexor Stretch Both;2 reps;20 seconds;Limitations    Hip Flexor Stretch Limitations single knee to chest stretch with other leg pressed into extension    Piriformis Stretch Both;2 reps;20 seconds;Limitations    Piriformis Stretch Limitations Figure 4 stretch with self-overpressure    Other Knee/Hip Stretches Gluteal stretch 5X 20 seconds (knee to opposite shoulder with other leg straight)      Knee/Hip Exercises: Machines for Strengthening   Cybex Leg Press 125# 2 sets of 10 single leg B with slow eccentrics, full range      Knee/Hip Exercises: Standing   Other Standing Knee Exercises Standing lumbar extension AROM (hips forward) 10X 3 seconds    Other Standing Knee Exercises Hip abduction with pelvic stabilization 2 sets of 5 for 3 seconds      Knee/Hip Exercises: Sidelying   Hip ABduction Strengthening;Right;2 sets;10 reps;Limitations    Hip ABduction Limitations 3 second hold  1/4 turn to stomach      Knee/Hip Exercises: Prone   Straight Leg Raises Strengthening;Both;2 sets;10 reps;Limitations    Straight Leg Raises Limitations 3 seconds                    PT Education - 03/05/21 1606     Education Details Reviewed DC instructions    Person(s) Educated Patient    Methods Explanation;Verbal cues;Tactile cues    Comprehension Returned demonstration;Verbal cues required;Verbalized understanding              PT Short Term Goals - 02/25/21 1052       PT SHORT TERM GOAL #1   Title Luis Fernandez will be able to take longer walks with his dog (10-15 minutes) without increased B hip pain.    Status Achieved               PT Long Term Goals - 02/27/21 1114       PT LONG TERM GOAL #1   Title Improve FOTO to 66.    Baseline 67, was 49    Status Achieved      PT LONG TERM GOAL #2   Title Improve B hip flexors flexibility to 90 degrees, hamstrings to 40 degrees, ER to 40 and IR to 5 degrees.    Baseline Met all but hip IR    Status Partially Met    Target Date 03/06/21      PT LONG TERM GOAL #3   Title Luis Fernandez will report B hip pain consistently 0-2/10 on the Numeric Pain Rating Scale.    Status Achieved      PT LONG TERM GOAL #4   Title Luis Fernandez will be independent with his long-term management HEP.    Status Achieved                   Plan - 03/05/21 1607     Clinical Impression Statement Luis Fernandez has met most LTGs established at evaluation.  His L hip is pain-free.  His R hip is much better and his current HEP is addressing remaining AROM and strength impairments.  He appears ready for transfer into independent rehabilitation.  Luis Fernandez is welcome to return for additional assistance if requested.    Personal Factors and Comorbidities Comorbidity 2    Comorbidities Controlled diabetes and HTN    Examination-Activity Limitations Sit;Sleep;Bed Mobility;Bend;Lift;Squat;Stairs;Locomotion Level;Stand    Examination-Participation Restrictions  Interpersonal Relationship;Yard Work;Community Activity    Stability/Clinical Decision Making Stable/Uncomplicated    Rehab Potential  Good    PT Frequency Other (comment)   DC   PT Duration Other (comment)    PT Treatment/Interventions ADLs/Self Care Home Management;Moist Heat;Cryotherapy;Therapeutic activities;Stair training;Gait training;Therapeutic exercise;Balance training;Neuromuscular re-education;Patient/family education;Manual techniques    PT Next Visit Plan Review long-term HEP with hip abductors strength emphasis    PT Home Exercise Plan Access Code: [email or web address]  URL: [email or web address]  Date: 02/27/2021  Prepared by: Vista Mink    Exercises  Standing Lumbar Extension at [place] - 5 x daily - 7 x weekly - 1 sets - 5 reps - 3 seconds hold  Lower Trunk Rotations - 1 x daily - 7 x weekly - 1 sets - 5-20 reps - 10-20 seconds hold  Single Knee to Chest Stretch - 1 x daily - 7 x weekly - 1 sets - 5 reps - 20 seconds hold  Supine Figure 4 Piriformis Stretch - 1 x daily - 7 x weekly - 1 sets - 5 reps - 20 seconds hold  Supine Gluteus Stretch - 1 x daily - 7 x weekly - 1 sets - 5 reps - 20 seconds hold  Supine Hamstring Stretch - 1 x daily - 7 x weekly - 1 sets - 5 reps - 20 seconds hold  Standing Hip Hiking - 2 x daily - 3-4 x weekly - 2 sets - 10 reps - 3 seconds hold  Tandem Stance - 1 x daily - 7 x weekly - 1 sets - 5 reps - 20 second hold  Prone Hip Extension - 1 x daily - 3-4 x weekly - 2 sets - 10 reps - 3 seconds hold    Consulted and Agree with Plan of Care Patient             Patient will benefit from skilled therapeutic intervention in order to improve the following deficits and impairments:  Abnormal gait, Decreased activity tolerance, Decreased endurance, Decreased range of motion, Decreased strength, Difficulty walking, Impaired flexibility, Pain  Visit Diagnosis: Difficulty in walking, not elsewhere classified  Muscle weakness (generalized)  Stiffness  of left hip, not elsewhere classified  Stiffness of right hip, not elsewhere classified  Pain in right hip     Problem List Patient Active Problem List   Diagnosis Date Noted   Bilateral hip pain 01/12/2021   Recurrent sinusitis 01/10/2020   Paresthesia 06/23/2016   OSA (obstructive sleep apnea) 06/23/2016   B12 deficiency 01/21/2016   Diabetes with neurologic complications ([place]) 61/44/3154   Hyperlipidemia LDL goal <70 12/17/2015   Hypertension associated with diabetes ([place]) 12/17/2015   Overweight (BMI 25.0-29.9) 12/17/2015   Status post total left knee replacement 12/17/2015   Insomnia 12/17/2015    Farley Ly PT, MPT 03/05/2021, 4:10 PM  [facility] [address] [place], [place], [address] Phone: [phone]   Fax:  [phone]  Name: Luis Fernandez MRN: [id number] Date of Birth: [birth year]

## 2021-03-05 NOTE — Patient Instructions (Signed)
Reviewed and updated HEP for DC into independent exercise

## 2021-03-07 ENCOUNTER — Ambulatory Visit (INDEPENDENT_AMBULATORY_CARE_PROVIDER_SITE_OTHER): Payer: Medicare Other | Admitting: Family Medicine

## 2021-03-07 ENCOUNTER — Encounter: Payer: Self-pay | Admitting: Family Medicine

## 2021-03-07 ENCOUNTER — Other Ambulatory Visit: Payer: Self-pay

## 2021-03-07 VITALS — BP 112/62 | HR 89 | Ht 75.0 in | Wt 233.0 lb

## 2021-03-07 DIAGNOSIS — M7062 Trochanteric bursitis, left hip: Secondary | ICD-10-CM | POA: Diagnosis not present

## 2021-03-07 DIAGNOSIS — M7061 Trochanteric bursitis, right hip: Secondary | ICD-10-CM | POA: Diagnosis not present

## 2021-03-07 NOTE — Patient Instructions (Addendum)
Good to see you today.  You had a R greater trochanter injection today.  Call or go to the ER if you develop a large red swollen joint with extreme pain or oozing puss.   Continue your home exercise program.  Follow-up as needed.  If not better could do an MRI.   I am happy to repeat the injection a little early prior to your trip to [place] in October if needed.

## 2021-03-07 NOTE — Progress Notes (Signed)
   I, [name], LAT, ATC, am serving as scribe for Dr. [name].  Luis Fernandez is a 75 y.o. male who presents to [facility] at [facility] today for f/u bilat hip pain thought to be due to greater trochanteric bursitis and hip abductor tendinopathy and OA. Pt was last seen by Dr. Denyse Amass on 01/17/21 and was referred to PT, of which he's completed 11 visits. Today, pt reports that his L hip is much better but his R hip is still problematic.  He is wondering if an injection might be appropriate at this point.  He has been d/c from PT and is doing his HEP.  He locates his pain to his R greater trochanter that he describes as a nagging ache.  Dx imaging: 01/09/21 R & L hip XR  Pertinent review of systems: No fevers or chills  Relevant historical information: Diabetes.   Exam:  BP 112/62 (BP Location: Right Arm, Patient Position: Sitting, Cuff Size: Large)   Pulse 89   Ht 6\' 3"  (1.905 m)   Wt 233 lb (105.7 kg)   SpO2 97%   BMI 29.12 kg/m  General: Well Developed, well nourished, and in no acute distress.   MSK: Right hip normal-appearing Palpation greater trochanter. Normal hip motion. Hip abduction strength diminished 4/5.    Lab and Radiology Results  Hip greater trochanteric injection: right  Consent obtained and timeout performed. Area of maximum tenderness palpated and identified. Skin cleaned with alcohol, cold spray applied. A 22g needle was used to access the greater trochanteric bursa. 40mg  of Kenalog and 2 mL of Marcaine were used to inject the trochanteric bursa. Patient tolerated the procedure well.    EXAM: DG HIP (WITH OR WITHOUT PELVIS) 2-3V RIGHT   COMPARISON:  None.   FINDINGS: There is no evidence of hip fracture or dislocation. Mild osteophyte formation of right hip is noted without significant joint space narrowing.   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 No acute abnormality seen.      Electronically Signed   By: M.D.   On: 01/12/2021 08:38   I, Lupita Raider, personally (independently) visualized and performed the interpretation of the images attached in this note.   Assessment and Plan: 75 y.o. male with right lateral hip pain due to greater trochanteric bursitis.  Improved but not resolved with physical therapy.  Plan for injection today.  Continue PT and home exercise program.  If not improved next step would be MRI. Left hip improved with PT continue home exercise watch voiding.    Discussed warning signs or symptoms. Please see discharge instructions. Patient expresses understanding.   The above documentation has been reviewed and is accurate and complete [name], M.D.

## 2021-03-26 ENCOUNTER — Encounter: Payer: Self-pay | Admitting: Neurology

## 2021-03-26 ENCOUNTER — Ambulatory Visit (INDEPENDENT_AMBULATORY_CARE_PROVIDER_SITE_OTHER): Payer: Medicare Other | Admitting: Neurology

## 2021-03-26 VITALS — BP 143/74 | HR 82 | Ht 75.0 in | Wt 235.5 lb

## 2021-03-26 DIAGNOSIS — I739 Peripheral vascular disease, unspecified: Secondary | ICD-10-CM | POA: Diagnosis not present

## 2021-03-26 DIAGNOSIS — I152 Hypertension secondary to endocrine disorders: Secondary | ICD-10-CM

## 2021-03-26 DIAGNOSIS — E1159 Type 2 diabetes mellitus with other circulatory complications: Secondary | ICD-10-CM

## 2021-03-26 DIAGNOSIS — G4733 Obstructive sleep apnea (adult) (pediatric): Secondary | ICD-10-CM | POA: Diagnosis not present

## 2021-03-26 DIAGNOSIS — J329 Chronic sinusitis, unspecified: Secondary | ICD-10-CM

## 2021-03-26 DIAGNOSIS — Z9989 Dependence on other enabling machines and devices: Secondary | ICD-10-CM

## 2021-03-26 NOTE — Progress Notes (Addendum)
SLEEP MEDICINE CLINIC    Provider:  Melvyn Novas, MD  Primary Care Physician:  [name], DO [address] [facility] [address] [place] [address]     Referring Provider: [name], Do [address] [place],  [place] [address]          Chief Complaint according to patient   Patient presents with:     New Patient (Initial Visit)     InterInternal referral for OSA. SS done no later then 2010, currently using CPAP. Pt reports doing well, pt has a travel cpap he uses at times. nal referral for OSA. SS done no later then 2010, currently using CPAP. Pt reports doing well, pt has a travel cpap he uses at times.       HISTORY OF PRESENT ILLNESS:  Alger Kerstein is a 75 y.o. Caucasian male  and retired [employer]- he is seen here as a referral on 03/26/2021 from new PCP. Chief concern according to patient:  " I was last tested in 2005, my only sleep stud and my CPAP has been replaced in 2015 , and is still working. I am using a nasal gel comfort mask. I also have a travel machine. "   I have the pleasure of seeing Viet Kemmerer 03-26-2021, a right-handed Caucasian male with a longstanding diagnosis of OSA sleep disorder.  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    The patient had the first sleep study in the year 2005, the last and only sleep study was done at [facility] physicians sleep center.  the results have been shared with Dr. Cyril Mourning, the ordering physician was Dr. Marjean Donna. Stewart at the time date of the study (05-29-2004 )the patient had an RDI of 16.2 /h, which is a mild degree of apnea,  and his oxygen nadir was 84% saturation.  He received a diagnosis of obstructive sleep apnea . he did have frequent limb movements but none of them were periodic or leading to arousals.   He had  about 16.8% REM sleep no deep sleep during the sleep study.  Latency to REM sleep was 96 minutes.   Sleep was not extremely fragmented.  There were no central apneas.   The patient was titrated in another sleep study to a CPAP at a pressure of 9.5 cmH2O this is where his machine today still is set.  The patient's compliance data for today's use of CPAP 100% compliance by days and hours with an average of 7 hours and 38 minutes per night.  The machine right now is set at 11 cm water pressure with 2 cm expiratory relief his residual AHI is 4.0 he does have high air leakage 95th percentile is 17 L/min.  He does have equal amounts of central and obstructive apneas arising.   Sleep relevant medical history: deviate septum repair, sinus surgery.   Family medical /sleep history: NO other family member on CPAP with OSA, insomnia, sleep walkers.    Social history:  Patient is retired from Social research officer, government , disability administrative judge , used to work at [employer].   and lives in a household with spouse, one adult son, 3 grands.   persons/ alone. One dog  present. Tobacco use/.  quit 1980. ETOH use ; 2 beers a week,  Caffeine intake in form of Coffee( 5-6 / day) Soda( /)  Tea ( /) or energy drinks. Regular exercise in form of GYM, 3 days a week, walking .   Hobbies :reading,       Sleep habits are as follows: The patient's dinner time is between 5-7 PM. The patient goes to bed at 10-11 PM and takes  a sleep aid, restoril, continues to sleep for 7 hours, wakes rarely for bathroom breaks. The preferred sleep position is supine, with the support of 1 pillow. Rare GERD. Dreams are reportedly infrequent/vivid.   7.30  AM is the usual rise time. The patient wakes up spontaneously/. He reports not feeling refreshed or restored in AM, with symptoms such as dry mouth, morning headaches, and residual fatigue. Sinus pressure.  Naps are taken infrequently.    Review of Systems: Out of a complete 14 system review,  the patient complains of only the following symptoms, and all other reviewed systems are negative.:   ON CPAP and  feeling good.    How likely are you to doze in the following situations: 0 = not likely, 1 = slight chance, 2 = moderate chance, 3 = high chance   Sitting and Reading? Watching Television? Sitting inactive in a public place (theater or meeting)? As a passenger in a car for an hour without a break? Lying down in the afternoon when circumstances permit? Sitting and talking to someone? Sitting quietly after lunch without alcohol? In a car, while stopped for a few minutes in traffic?   Total = 7/ 24 points   FSS endorsed at 24/ 63 points.   GDS 4 / 15 points.   Hoarseness, nasal deviation.   Social History   Socioeconomic History   Marital status: Married    Spouse name: Johnny Bridge   Number of children: 1   Years of education: 19   Highest education level: Master's degree (e.g., MA, MS, MEng, MEd, MSW, MBA)  Occupational History   Occupation: Clinical research associate    Comment: retired  Tobacco Use   Smoking status: Former   Smokeless tobacco: Never  Building services engineer Use: Never used  Substance and Sexual Activity   Alcohol use: Yes    Alcohol/week: 3.0 standard drinks    Types: 3 Cans of beer per week    Comment: 2-3 beers a week, occasionally   Drug use: No   Sexual activity: Yes  Other Topics Concern   Not on file  Social History Narrative   Walks his dog for 1 hour every day. Goes to the gym as well and strength training and aerobic exercise.     Lives with wife   Caffeine, 5 cups of coffee a day   Right handed   Social Determinants of Health   Financial Resource Strain: Not on file  Food Insecurity: Not on file  Transportation Needs: Not on file  Physical Activity: Not on file  Stress: Not on file  Social Connections: Not on file    Family History  Problem Relation Age of Onset   Heart disease Mother    Hypertension Mother    Parkinson's disease Father     Dementia Brother    Hypertension Brother    Depression Brother    COPD Brother    Colon cancer Neg Hx    Stomach cancer Neg Hx    Rectal cancer Neg Hx    Pancreatic cancer Neg Hx    Esophageal cancer Neg Hx     Past Medical History:  Diagnosis Date   Arthritis  Diabetes mellitus without complication (HCC)    Hypertension    Steroid-induced adrenal suppression (HCC) 01/21/2016   On medical reccords     Past Surgical History:  Procedure Laterality Date   CARPAL TUNNEL RELEASE     CATARACT EXTRACTION     NOSE SURGERY     TOTAL KNEE ARTHROPLASTY     UMBILICAL HERNIA REPAIR       Current Outpatient Medications on File Prior to Visit  Medication Sig Dispense Refill   cephALEXin (KEFLEX) 500 MG capsule Take 4 capsules 1 hour prior to dental procedure. 20 capsule 1   dapagliflozin propanediol (FARXIGA) 10 MG TABS tablet Take 1 tablet (10 mg total) by mouth daily. 90 tablet 2   Dulaglutide (TRULICITY) 1.5 MG/0.5ML SOPN Inject 1.5 mg into the skin once a week. 2 mL 2   fenofibrate (TRICOR) 145 MG tablet TAKE ONE TABLET BY MOUTH DAILY 90 tablet 2   Glucosamine 500 MG CAPS Take by mouth.     glucose blood (ONE TOUCH ULTRA TEST) test strip USE TO CHECK FOR BLOOD SUGAR THREE TIMES A DAY 300 each PRN   ibuprofen (ADVIL) 600 MG tablet TAKE ONE TABLET BY MOUTH EVERY 8 HOURS AS NEEDED 270 tablet 9   Lancets (ONETOUCH DELICA PLUS LANCET33G) MISC USE TO CHECK BLOOD SUGAR THREE TIMES A DAY 300 each PRN   LORazepam (ATIVAN) 0.5 MG tablet TAKE ONE TABLET BY MOUTH ONE TIME A DAY 30 tablet 3   losartan (COZAAR) 50 MG tablet Take 1 tablet (50 mg total) by mouth daily. 90 tablet 1   meclizine (ANTIVERT) 25 MG tablet Take 1 tablet (25 mg total) by mouth 3 (three) times daily as needed for dizziness or nausea. 30 tablet 3   MELATONIN PO Take by mouth.     metFORMIN (GLUCOPHAGE-XR) 500 MG 24 hr tablet Take 2 tablets (1,000 mg total) by mouth 2 (two) times daily. 360 tablet 3   montelukast (SINGULAIR)  10 MG tablet TAKE ONE TABLET BY MOUTH AT BEDTIME 90 tablet 2   Multiple Vitamins-Minerals (MULTIVITAMIN PO) Take by mouth.     Omega-3 Fatty Acids (FISH OIL PO) Take by mouth.     pravastatin (PRAVACHOL) 80 MG tablet Take 1 tablet (80 mg total) by mouth daily. 90 tablet 3   temazepam (RESTORIL) 30 MG capsule TAKE ONE CAPSULE BY MOUTH EVERY NIGHT AT BEDTIME AS NEEDED FOR SLEEP 30 capsule 5   No current facility-administered medications on file prior to visit.  Physical exam:  Today's Vitals   03/26/21 1241  BP: (!) 143/74  Pulse: 82  Weight: 235 lb 8 oz (106.8 kg)  Height:  (1.905 m)   Body mass index is 29.44 kg/m.   Wt Readings from Last 3 Encounters:  03/26/21 235 lb 8 oz (106.8 kg)  03/07/21 233 lb (105.7 kg)  01/17/21 233 lb 6.4 oz (105.9 kg)     Ht Readings from Last 3 Encounters:  03/26/21  (1.905 m)  03/07/21  (1.905 m)  01/17/21  (1.905 m)      General: The patient is awake, alert and appears not in acute distress. The patient is well groomed.  Facial hair, long hair.  Head: Normocephalic, atraumatic. Neck is supple. Mallampati 1,  neck circumference:18 inches . Nasal airflow barely patent.  Retrognathia is not  seen.  Dental status: biological teeth and one bridge - front teeth Cardiovascular:  Regular rate and cardiac rhythm by pulse,  without distended neck veins. Respiratory:  Lungs are clear to auscultation.  Skin:  Without evidence of ankle edema, or rash.  Varicose veins. Trunk: The patient's posture is erect.   Neurologic exam : The patient is awake and alert, oriented to place and time.   Memory subjective described as intact.  Attention span & concentration ability appears normal.  Speech is fluent,  without  dysarthria, dysphonia or aphasia.  Mood and affect are appropriate.   Cranial nerves: no loss of smell or taste reported  Pupils are equal and briskly reactive to light. Funduscopic exam deferred .  Extraocular movements  in vertical and horizontal planes were intact and without nystagmus. No Diplopia. Visual fields by finger perimetry are intact. Hearing was intact to soft voice and finger rubbing.    Facial sensation intact to fine touch.  Facial motor strength is symmetric and tongue and uvula move midline.  Neck ROM : rotation, tilt and flexion extension were normal for age and shoulder shrug was symmetrical.    Motor exam:  Symmetric bulk, tone and ROM.   Normal tone without cog -wheeling, symmetric grip strength .   Sensory:  Fine touch, pinprick and vibration were tested  and  normal.  Proprioception tested in the upper extremities was normal.   Coordination: Rapid alternating movements in the fingers/hands were of normal speed.  The Finger-to-nose maneuver was intact without evidence of ataxia, dysmetria but action tremor.bilaterally seen with action not at rest, low amplitude.   Gait and station: Patient could rise unassisted from a seated position, walked without assistive device.  Stance is of normal width/ base and the patient turned with  steps.  Toe and heel walk were deferred.  Deep tendon reflexes: in the  upper and lower extremities are symmetric and intact. 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       After spending a total time of  45  minutes face to face and additional time for physical and neurologic examination, review of laboratory studies,  personal review of imaging studies, reports and results of other testing and review of referral information / records as far as provided in visit, I have established the following assessments:  1) Mr. Vitelli has hypertension which has been well controlled with the current medications, he is taking pravastatin for hyperlipidemia, he has actually lost weight over the last documented 12 months.   His obstructive sleep apnea has not been re-evaluated at baseline since his initial evaluation 17 years ago.    Twice the pressures on his machine seem to have been increased by about a centimeter water pressure each time.  Overall he feels that his sleep is pretty restorative, he is able to sleep through the night with the help of a low-dose medication, sometimes he wakes up with a headache but he correlates that with allergic rhinitis-sinusitis.    Given that his apnea hypopnea index is fairly well controlled he seems to have been doing well with CPAP overall and even with his current mask.  What I like to offer him is a baseline sleep study - we can do a home sleep test.  I will order actually both an attended sleep study and a home sleep test and his insurance will make the decision which one they cover.    He does not have restless legs.   He is not acting out dreams and he is not aware of vivid or posttraumatic stress related dream enactment.   So my main  attention is really to re-grade the current level of apnea and how to best provide treatment from here.    My Plan is to proceed with:  1) PSG or HST,  2) RV 2 month with me.  3) nasal septal deviation, narrow bridge , may do better with an under the nose fitting mask-   I would like to thank Everrett CoombeMatthews, Cody, DO and Everrett CoombeMatthews, Cody, Do [address] [place],  [place] [id number] for allowing me to meet with and to take care of this pleasant patient.   In short, Karl BalesLawrence Heckstall is presenting with almost 2 decades of CPAP use , needs a new machine.  I plan to follow up either personally or through our NP within 2-3 month.   CC: I will share my notes with PCP.  Electronically signed by: Melvyn Novasarmen Dasia Guerrier, MD 03/26/2021 1:16 PM  [facility] certified by The [employer] Medicine and Diplomate of the [employer]. Board certified In Neurology through the ABPN, Fellow of the [employer]. Medical Director of [facility].

## 2021-03-28 DIAGNOSIS — Z23 Encounter for immunization: Secondary | ICD-10-CM | POA: Diagnosis not present

## 2021-04-01 NOTE — Addendum Note (Signed)
Addended by: Melvyn Novas on: 04/01/2021 05:06 PM   Modules accepted: Orders

## 2021-04-03 ENCOUNTER — Other Ambulatory Visit: Payer: Self-pay | Admitting: Family Medicine

## 2021-04-07 ENCOUNTER — Other Ambulatory Visit: Payer: Self-pay | Admitting: Family Medicine

## 2021-04-08 ENCOUNTER — Other Ambulatory Visit: Payer: Self-pay

## 2021-04-11 ENCOUNTER — Other Ambulatory Visit: Payer: Self-pay | Admitting: Family Medicine

## 2021-04-11 DIAGNOSIS — E1142 Type 2 diabetes mellitus with diabetic polyneuropathy: Secondary | ICD-10-CM

## 2021-04-14 DIAGNOSIS — Z23 Encounter for immunization: Secondary | ICD-10-CM | POA: Diagnosis not present

## 2021-04-26 ENCOUNTER — Ambulatory Visit (INDEPENDENT_AMBULATORY_CARE_PROVIDER_SITE_OTHER): Payer: Medicare Other | Admitting: *Deleted

## 2021-04-26 DIAGNOSIS — Z0000 Encounter for general adult medical examination without abnormal findings: Secondary | ICD-10-CM

## 2021-04-26 NOTE — Progress Notes (Signed)
Subjective:   Luis Fernandez is a 75 y.o. male who presents for Medicare Annual/Subsequent preventive examination.  Virtual Visit via Telephone Note  I connected with  Luis Fernandez on 04/26/21 at 11:00 AM EDT by telephone and verified that I am speaking with the correct person using two identifiers.  Location: Patient: Home Provider: Office  Persons participating in the virtual visit: patient/Nurse Health Advisor   I discussed the limitations, risks, security and privacy concerns of performing an evaluation and management service by telephone and the availability of in person appointments. The patient expressed understanding and agreed to proceed.  Interactive audio and video telecommunications were attempted between this nurse and patient, however failed, due to patient having technical difficulties OR patient did not have access to video capability.  We continued and completed visit with audio only.  Some vital signs may be absent or patient reported.   Luis Fernandez, CMA  Cardiac Risk Factors include: advanced age (>56men, >29 women);diabetes mellitus;male gender     Objective:    There were no vitals filed for this visit. There is no height or weight on file to calculate BMI.  Advanced Directives 04/26/2021 01/23/2021 09/12/2018 09/30/2017 12/17/2015  Does Patient Have a Medical Advance Directive? Yes Yes Yes Yes Yes  Type of Estate agent of [place];Living will Healthcare Power of [place];Living will;Out of facility DNR (pink MOST or yellow form) Healthcare Power of [place];Living will Healthcare Power of [place];Living will Healthcare Power of [place];Living will;Out of facility DNR (pink MOST or yellow form)  Does patient want to make changes to medical advance directive? No - Patient declined No - Patient declined Yes (MAU/Ambulatory/Procedural Areas - Information given) - -  Copy of Healthcare Power of Attorney in Chart? Yes - validated most  recent copy scanned in chart (See row information) No - copy requested No - copy requested Yes No - copy requested    Current Medications (verified) Outpatient Encounter Medications as of 04/26/2021  Medication Sig   cephALEXin (KEFLEX) 500 MG capsule Take 4 capsules 1 hour prior to dental procedure.   Cyanocobalamin (VITAMIN B12 PO) Take by mouth.   dapagliflozin propanediol (FARXIGA) 10 MG TABS tablet Take 1 tablet (10 mg total) by mouth daily.   Dulaglutide (TRULICITY) 1.5 MG/0.5ML SOPN INJECT 1.5 MG UNDER THE SKIN ONCE WEEKLY   fenofibrate (TRICOR) 145 MG tablet TAKE ONE TABLET BY MOUTH DAILY   Glucosamine 500 MG CAPS Take by mouth.   ibuprofen (ADVIL) 600 MG tablet TAKE ONE TABLET BY MOUTH EVERY 8 HOURS AS NEEDED   Lancets (ONETOUCH DELICA PLUS LANCET33G) MISC USE TO CHECK BLOOD SUGAR THREE TIMES A DAY   LORazepam (ATIVAN) 0.5 MG tablet TAKE ONE TABLET BY MOUTH DAILY (Patient taking differently: 0.5 mg every 6 (six) hours as needed.)   losartan (COZAAR) 50 MG tablet Take 1 tablet (50 mg total) by mouth daily.   meclizine (ANTIVERT) 25 MG tablet Take 1 tablet (25 mg total) by mouth 3 (three) times daily as needed for dizziness or nausea.   MELATONIN PO Take by mouth.   metFORMIN (GLUCOPHAGE-XR) 500 MG 24 hr tablet Take 2 tablets (1,000 mg total) by mouth 2 (two) times daily.   montelukast (SINGULAIR) 10 MG tablet TAKE ONE TABLET BY MOUTH AT BEDTIME   Multiple Vitamins-Minerals (MULTIVITAMIN PO) Take by mouth.   Omega-3 Fatty Acids (FISH OIL PO) Take by mouth.   ONETOUCH ULTRA test strip USE ONE STRIP TO TEST THREE TIMES A DAY   pravastatin (  PRAVACHOL) 80 MG tablet Take 1 tablet (80 mg total) by mouth daily.   temazepam (RESTORIL) 30 MG capsule TAKE ONE CAPSULE BY MOUTH EVERY NIGHT AT BEDTIME AS NEEDED FOR SLEEP   No facility-administered encounter medications on file as of 04/26/2021.    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   On medical reccords    Past Surgical History:  Procedure Laterality Date   CARPAL TUNNEL RELEASE     CATARACT EXTRACTION     NOSE SURGERY     TOTAL KNEE ARTHROPLASTY     UMBILICAL HERNIA REPAIR     Family History  Problem Relation Age of Onset   Heart disease Mother    Hypertension Mother    Parkinson's disease Father    Dementia Brother    Hypertension Brother    Depression Brother    COPD Brother    Colon cancer Neg Hx    Stomach cancer Neg Hx    Rectal cancer Neg Hx    Pancreatic cancer Neg Hx    Esophageal cancer Neg Hx    Social History   Socioeconomic History   Marital status: Married    Spouse name: Johnny Bridge   Number of children: 1   Years of education: 19   Highest education level: Master's degree (e.g., MA, MS, MEng, MEd, MSW, MBA)  Occupational History   Occupation: Clinical research associate    Comment: retired  Tobacco Use   Smoking status: Former   Smokeless tobacco: Never  Building services engineer Use: Never used  Substance and Sexual Activity   Alcohol use: Yes    Alcohol/week: 3.0 standard drinks    Types: 3 Cans of beer per week    Comment: 2-3 beers a week, occasionally/glass of wine 2 times weekly   Drug use: No   Sexual activity: Yes  Other Topics Concern   Not on file  Social History Narrative   04/26/2021 - lives at home with wife - walks at least a mile daily and gym 3-4 times weekly - walks with dog - wants to travel more and likes to garden             Walks his dog for 1 hour every day. Goes to the gym as well and strength training and aerobic exercise.     Lives with wife   Caffeine, 5 cups of coffee a day   Right handed   Social Determinants of Health   Financial Resource Strain: Not on file  Food Insecurity: Not on file  Transportation Needs: Not on file  Physical Activity: Not on file  Stress: Not on file  Social Connections:  Not on file    Tobacco Counseling Counseling given: Not Answered   Clinical Intake:  Pre-visit preparation completed: Yes  Pain : No/denies pain     BMI - recorded:  (weight today is 230lbs) Nutritional Risks: None Diabetes: Yes CBG done?: Yes (120 this morning) Did pt. bring in CBG monitor from home?: Yes  How often do you need to have someone help you when you read instructions, pamphlets, or other written materials from your doctor or pharmacy?: 1 - Never What is the last grade level you completed in school?: graduated from high school - graduated from law school  Diabetic Yes   Interpreter Needed?: No  Information entered by ::  Luis Fernandez, CMA   Activities of Daily Living In your present state of health, do you have any difficulty performing the following activities: 04/26/2021  Hearing? Y  Comment wears hearing aids  Vision? Y  Comment wears glasses - has appointment Monday with provider  Walking or climbing stairs? N  Dressing or bathing? N  Doing errands, shopping? N  Preparing Food and eating ? N  Using the Toilet? N  In the past six months, have you accidently leaked urine? N  Do you have problems with loss of bowel control? N  Managing your Medications? N  Managing your Finances? N  Housekeeping or managing your Housekeeping? N  Some recent data might be hidden    Patient Care Team: [name], DO as PCP - General (Family Medicine)  Indicate any recent Medical Services you may have received from other than [facility] providers in the past year (date may be approximate).     Assessment:   This is a routine wellness examination for [employer].  Hearing/Vision screen Hearing Screening - Comments:: Wears hearing aids - sees provider in [place] yearly  Vision Screening - Comments:: Wears glasses - had cataract surgery in both eyes - sees Dr Hyacinth Meeker yearly   Dietary issues and exercise activities discussed: Current Exercise Habits: Home exercise  routine;Structured exercise class, Type of exercise: walking;Other - see comments;strength training/weights (goes to gym 3 times weekly), Time (Minutes): 40, Frequency (Times/Week): 5, Weekly Exercise (Minutes/Week): 200, Intensity: Moderate, Exercise limited by: None identified   Goals Addressed             This Visit's Progress    Weight (lb) < 200 lb (90.7 kg)       Actively losing weight        Depression Screen PHQ 2/9 Scores 04/26/2021 07/11/2020 09/27/2019 09/12/2018 01/03/2018 05/26/2017 12/29/2016  PHQ - 2 Score 0 1 0 0 0 0 0  PHQ- 9 Score - 2 0 - - - -    Fall Risk Fall Risk  04/26/2021 01/09/2021 09/27/2019 09/12/2018 08/05/2018  Falls in the past year? 1 0 0 1 0  Comment lost balance and fell no injuries - - - -  Number falls in past yr: 0 0 0 0 0  Injury with Fall? - 0 0 0 0  Risk for fall due to : Impaired balance/gait Impaired mobility - - -  Follow up Education provided Falls evaluation completed Falls evaluation completed Falls prevention discussed -    FALL RISK PREVENTION PERTAINING TO THE HOME:  Any stairs in or around the home? Yes  If so, are there any without handrails? Yes  Home free of loose throw rugs in walkways, pet beds, electrical cords, etc? No  Adequate lighting in your home to reduce risk of falls? Yes   ASSISTIVE DEVICES UTILIZED TO PREVENT FALLS:  Life alert? No  Use of a cane, walker or w/c? No  Grab bars in the bathroom? Yes  Shower chair or bench in shower? Yes  Elevated toilet seat or a handicapped toilet? Yes   TIMED UP AND GO:  Was the test performed? No .   Telephone appointment   Cognitive Function:     6CIT Screen 04/26/2021 09/12/2018 12/29/2016  What Year? 0 points 0 points 0 points  What month? 0 points 0 points 0 points  What time? 0 points 0 points 0 points  Count back from 20 0 points 0 points 0 points  Months in reverse - 0 points 0 points  Repeat phrase - 2 points 4 points  Total Score - 2 4     Immunizations Immunization History  Administered Date(s) Administered   Fluad Quad(high Dose 65+) 04/19/2019   Influenza Inj Mdck Quad Pf 03/28/2021   Influenza, High Dose Seasonal PF 04/05/2018   Influenza-Unspecified 05/12/2016, 05/12/2017, 04/12/2020   Moderna Sars-Covid-2 Vaccination 09/02/2019, 10/02/2019, 06/03/2020, 11/15/2020   Pneumococcal Conjugate-13 06/22/2014   Pneumococcal Polysaccharide-23 02/20/2010   Pneumococcal-Unspecified 02/20/2010   Tdap 03/04/2009   Zoster Recombinat (Shingrix) 11/01/2016, 03/01/2017   Zoster, Live 01/01/2002    TDAP status: Up to date - vaccine done at [facility] in [place], [place] on [address] in 2018 or 2019 (doesn't remember exact date - will provide records at next pcp appt)  Flu Vaccine status: Up to date  Pneumococcal vaccine status: Up to date  Covid-19 vaccine status: Completed vaccines  Qualifies for Shingles Vaccine? Yes   Zostavax completed Yes   Shingrix Completed?: Yes  Screening Tests Health Maintenance  Topic Date Due   TETANUS/TDAP  03/05/2019   FOOT EXAM  01/09/2021   HEMOGLOBIN A1C  07/11/2021   OPHTHALMOLOGY EXAM  08/01/2021   INFLUENZA VACCINE  Completed   COVID-19 Vaccine  Completed   Hepatitis C Screening  Completed   Zoster Vaccines- Shingrix  Completed   HPV VACCINES  Aged Out    Health Maintenance  Health Maintenance Due  Topic Date Due   TETANUS/TDAP  03/05/2019   FOOT EXAM  01/09/2021    Colorectal cancer screening: Type of screening: Colonoscopy. Completed 03/06/2020. Repeat every 10 years  Lung Cancer Screening: (Low Dose CT Chest recommended if Age 76-80 years, 30 pack-year currently smoking OR have quit w/in 15years.) does not qualify.    Additional Screening:  Hepatitis C Screening: does qualify; Completed 03/18/2016  Vision Screening: Recommended annual ophthalmology exams for early detection of glaucoma and other disorders of the eye. Is the patient up to date with their  annual eye exam?  Yes  Who is the provider or what is the name of the office in which the patient attends annual eye exams? Dr Hope Budds, [place] has appointment scheduled Monday   Dental Screening: Recommended annual dental exams for proper oral hygiene  Community Resource Referral / Chronic Care Management: CRR required this visit?  No   CCM required this visit?  No      Plan:     I have personally reviewed and noted the following in the patient's chart:   Medical and social history Use of alcohol, tobacco or illicit drugs  Current medications and supplements including opioid prescriptions. Patient is not currently taking opioid prescriptions. Functional ability and status Nutritional status Physical activity Advanced directives List of other physicians Hospitalizations, surgeries, and ER visits in previous 12 months Vitals Screenings to include cognitive, depression, and falls Referrals and appointments  In addition, I have reviewed and discussed with patient certain preventive protocols, quality metrics, and best practice recommendations. A written personalized care plan for preventive services as well as general preventive health recommendations were provided to patient.     Luis Fernandez, CMA   04/26/2021   Nurse Notes: Due for Diabetic foot exam - non face to face 60 minute visit

## 2021-04-26 NOTE — Patient Instructions (Signed)
[name] , Thank you for taking time to come for your Medicare Wellness Visit. I appreciate your ongoing commitment to your health goals. Please review the following plan we discussed and let me know if I can assist you in the future.   Screening recommendations/referrals: Colonoscopy: up to date - next due August of 2031 Recommended yearly ophthalmology/optometry visit for glaucoma screening and checkup Recommended yearly dental visit for hygiene and checkup  Vaccinations: Influenza vaccine: up to date  Pneumococcal vaccine: completed  Tdap vaccine: next due 2028 - please bring vaccine record of Tdap to office to mark in your chart  Shingles vaccine: completed    Covid-19: completed   Advanced directives: completed   Conditions/risks identified: none  Next appointment: Follow up in one year for your annual wellness visit.   Preventive Care 76 Years and Older, Male Preventive care refers to lifestyle choices and visits with your health care provider that can promote health and wellness. What does preventive care include? A yearly physical exam. This is also called an annual well check. Dental exams once or twice a year. Routine eye exams. Ask your health care provider how often you should have your eyes checked. Personal lifestyle choices, including: Daily care of your teeth and gums. Regular physical activity. Eating a healthy diet. Avoiding tobacco and drug use. Limiting alcohol use. Practicing safe sex. Taking low doses of aspirin every day. Taking vitamin and mineral supplements as recommended by your health care provider. What happens during an annual well check? The services and screenings done by your health care provider during your annual well check will depend on your age, overall health, lifestyle risk factors, and family history of disease. Counseling  Your health care provider may ask you questions about your: Alcohol use. Tobacco use. Drug use. Emotional  well-being. Home and relationship well-being. Sexual activity. Eating habits. History of falls. Memory and ability to understand (cognition). Work and work Astronomer. Screening  You may have the following tests or measurements: Height, weight, and BMI. Blood pressure. Lipid and cholesterol levels. These may be checked every 5 years, or more frequently if you are over 85 years old. Skin check. Lung cancer screening. You may have this screening every year starting at age 76 if you have a 30-pack-year history of smoking and currently smoke or have quit within the past 15 years. Fecal occult blood test (FOBT) of the stool. You may have this test every year starting at age 76 Flexible sigmoidoscopy or colonoscopy. You may have a sigmoidoscopy every 5 years or a colonoscopy every 10 years starting at age 76 Prostate cancer screening. Recommendations will vary depending on your family history and other risks. Hepatitis C blood test. Hepatitis B blood test. Sexually transmitted disease (STD) testing. Diabetes screening. This is done by checking your blood sugar (glucose) after you have not eaten for a while (fasting). You may have this done every 1-3 years. Abdominal aortic aneurysm (AAA) screening. You may need this if you are a current or former smoker. Osteoporosis. You may be screened starting at age 45 if you are at high risk. Talk with your health care provider about your test results, treatment options, and if necessary, the need for more tests. Vaccines  Your health care provider may recommend certain vaccines, such as: Influenza vaccine. This is recommended every year. Tetanus, diphtheria, and acellular pertussis (Tdap, Td) vaccine. You may need a Td booster every 10 years. Zoster vaccine. You may need this after age 76 Pneumococcal 13-valent conjugate (  PCV13) vaccine. One dose is recommended after age 76. Pneumococcal polysaccharide (PPSV23) vaccine. One dose is recommended after  age 96. Talk to your health care provider about which screenings and vaccines you need and how often you need them. This information is not intended to replace advice given to you by your health care provider. Make sure you discuss any questions you have with your health care provider. Document Released: 08/16/2015 Document Revised: 04/08/2016 Document Reviewed: 05/21/2015 Elsevier Interactive Patient Education  2017 [employer].  Fall Prevention in the Home Falls can cause injuries. They can happen to people of all ages. There are many things you can do to make your home safe and to help prevent falls. What can I do on the outside of my home? Regularly fix the edges of walkways and driveways and fix any cracks. Remove anything that might make you trip as you walk through a door, such as a raised step or threshold. Trim any bushes or trees on the path to your home. Use bright outdoor lighting. Clear any walking paths of anything that might make someone trip, such as rocks or tools. Regularly check to see if handrails are loose or broken. Make sure that both sides of any steps have handrails. Any raised decks and porches should have guardrails on the edges. Have any leaves, snow, or ice cleared regularly. Use sand or salt on walking paths during winter. Clean up any spills in your garage right away. This includes oil or grease spills. What can I do in the bathroom? Use night lights. Install grab bars by the toilet and in the tub and shower. Do not use towel bars as grab bars. Use non-skid mats or decals in the tub or shower. If you need to sit down in the shower, use a plastic, non-slip stool. Keep the floor dry. Clean up any water that spills on the floor as soon as it happens. Remove soap buildup in the tub or shower regularly. Attach bath mats securely with double-sided non-slip rug tape. Do not have throw rugs and other things on the floor that can make you trip. What can I do in the  bedroom? Use night lights. Make sure that you have a light by your bed that is easy to reach. Do not use any sheets or blankets that are too big for your bed. They should not hang down onto the floor. Have a firm chair that has side arms. You can use this for support while you get dressed. Do not have throw rugs and other things on the floor that can make you trip. What can I do in the kitchen? Clean up any spills right away. Avoid walking on wet floors. Keep items that you use a lot in easy-to-reach places. If you need to reach something above you, use a strong step stool that has a grab bar. Keep electrical cords out of the way. Do not use floor polish or wax that makes floors slippery. If you must use wax, use non-skid floor wax. Do not have throw rugs and other things on the floor that can make you trip. What can I do with my stairs? Do not leave any items on the stairs. Make sure that there are handrails on both sides of the stairs and use them. Fix handrails that are broken or loose. Make sure that handrails are as long as the stairways. Check any carpeting to make sure that it is firmly attached to the stairs. Fix any carpet that is  loose or worn. Avoid having throw rugs at the top or bottom of the stairs. If you do have throw rugs, attach them to the floor with carpet tape. Make sure that you have a light switch at the top of the stairs and the bottom of the stairs. If you do not have them, ask someone to add them for you. What else can I do to help prevent falls? Wear shoes that: Do not have high heels. Have rubber bottoms. Are comfortable and fit you well. Are closed at the toe. Do not wear sandals. If you use a stepladder: Make sure that it is fully opened. Do not climb a closed stepladder. Make sure that both sides of the stepladder are locked into place. Ask someone to hold it for you, if possible. Clearly mark and make sure that you can see: Any grab bars or  handrails. First and last steps. Where the edge of each step is. Use tools that help you move around (mobility aids) if they are needed. These include: Canes. Walkers. Scooters. Crutches. Turn on the lights when you go into a dark area. Replace any light bulbs as soon as they burn out. Set up your furniture so you have a clear path. Avoid moving your furniture around. If any of your floors are uneven, fix them. If there are any pets around you, be aware of where they are. Review your medicines with your doctor. Some medicines can make you feel dizzy. This can increase your chance of falling. Ask your doctor what other things that you can do to help prevent falls. This information is not intended to replace advice given to you by your health care provider. Make sure you discuss any questions you have with your health care provider. Document Released: 05/16/2009 Document Revised: 12/26/2015 Document Reviewed: 08/24/2014 Elsevier Interactive Patient Education  2017 [employer].

## 2021-04-28 ENCOUNTER — Ambulatory Visit (INDEPENDENT_AMBULATORY_CARE_PROVIDER_SITE_OTHER): Payer: Medicare Other | Admitting: Neurology

## 2021-04-28 DIAGNOSIS — G4733 Obstructive sleep apnea (adult) (pediatric): Secondary | ICD-10-CM

## 2021-04-28 DIAGNOSIS — I152 Hypertension secondary to endocrine disorders: Secondary | ICD-10-CM

## 2021-04-28 DIAGNOSIS — I10 Essential (primary) hypertension: Secondary | ICD-10-CM | POA: Diagnosis not present

## 2021-04-28 DIAGNOSIS — I739 Peripheral vascular disease, unspecified: Secondary | ICD-10-CM

## 2021-04-28 DIAGNOSIS — Z9989 Dependence on other enabling machines and devices: Secondary | ICD-10-CM

## 2021-04-28 DIAGNOSIS — J329 Chronic sinusitis, unspecified: Secondary | ICD-10-CM

## 2021-04-28 DIAGNOSIS — H35033 Hypertensive retinopathy, bilateral: Secondary | ICD-10-CM | POA: Diagnosis not present

## 2021-04-28 DIAGNOSIS — E1159 Type 2 diabetes mellitus with other circulatory complications: Secondary | ICD-10-CM

## 2021-04-30 ENCOUNTER — Encounter: Payer: Self-pay | Admitting: Family Medicine

## 2021-04-30 ENCOUNTER — Telehealth (INDEPENDENT_AMBULATORY_CARE_PROVIDER_SITE_OTHER): Payer: Medicare Other | Admitting: Family Medicine

## 2021-04-30 DIAGNOSIS — J019 Acute sinusitis, unspecified: Secondary | ICD-10-CM

## 2021-04-30 DIAGNOSIS — B9689 Other specified bacterial agents as the cause of diseases classified elsewhere: Secondary | ICD-10-CM | POA: Diagnosis not present

## 2021-04-30 MED ORDER — CEPHALEXIN 500 MG PO CAPS: 500.0000 mg | ORAL_CAPSULE | Freq: Four times a day (QID) | ORAL | 0 refills | Status: AC

## 2021-04-30 NOTE — Assessment & Plan Note (Signed)
Cephalexin has worked well for him in the past for treatment of sinusitis.  We will start 500 mg 4 times daily.  Recommend supportive care with increased fluids and rest.  Instructed to follow-up if not improving after completion of antibiotics.

## 2021-04-30 NOTE — Progress Notes (Signed)
Luis Fernandez - 75 y.o. male MRN [id number]  Date of birth: 1946/02/15   This visit type was conducted due to national recommendations for restrictions regarding the COVID-19 Pandemic (e.g. social distancing).  This format is felt to be most appropriate for this patient at this time.  All issues noted in this document were discussed and addressed.  No physical exam was performed (except for noted visual exam findings with Video Visits).  I discussed the limitations of evaluation and management by telemedicine and the availability of in person appointments. The patient expressed understanding and agreed to proceed.  I connected withNAME@ on 04/30/21 at 11:30 AM EDT by a video enabled telemedicine application and verified that I am speaking with the correct person using two identifiers.  Present at visit: [name], DO [name]   Patient Location: [facility] [address] [place] [address]   Provider location:   [facility]  No chief complaint on file.   HPI  Luis Fernandez is a 75 y.o. male who presents via audio/video conferencing for a telehealth visit today.  He has complaint of sinusitis.  Symptoms started about a week and 1/2 to 2 weeks ago.  Symptoms have been progressive since that time and similar to previous sinus infections.  He does have thick, yellow-green mucus, sinus pain, ear pressure, postnasal drainage and mild fatigue.  He has not had fever, chills, GI symptoms or body aches.  Cephalexin has worked well for his sinus infections in the past.   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   On medical reccords     Past Surgical History:  Procedure Laterality Date   CARPAL TUNNEL RELEASE     CATARACT EXTRACTION     NOSE SURGERY     TOTAL KNEE  ARTHROPLASTY     UMBILICAL HERNIA REPAIR      Family History  Problem Relation Age of Onset   Heart disease Mother    Hypertension Mother    Parkinson's disease Father    Dementia Brother    Hypertension Brother    Depression Brother    COPD Brother    Colon cancer Neg Hx    Stomach cancer Neg Hx    Rectal cancer Neg Hx    Pancreatic cancer Neg Hx    Esophageal cancer Neg Hx     Social History   Socioeconomic History   Marital status: Married    Spouse name: Johnny Bridge   Number of children: 1   Years of education: 19   Highest education level: Master's degree (e.g., MA, MS, MEng, MEd, MSW, MBA)  Occupational History   Occupation: Clinical research associate    Comment: retired  Tobacco Use   Smoking status: Former   Smokeless tobacco: Never  Building services engineer Use: Never used  Substance and Sexual Activity   Alcohol use: Yes    Alcohol/week: 3.0 standard drinks    Types: 3 Cans of beer per week    Comment: 2-3 beers a week, occasionally/glass of wine 2 times weekly   Drug use: No   Sexual activity: Yes  Other Topics Concern   Not on file  Social History Narrative   04/26/2021 - lives at home with wife - walks at least a mile daily and gym 3-4 times weekly -  walks with dog - wants to travel more and likes to garden             Walks his dog for 1 hour every day. Goes to the gym as well and strength training and aerobic exercise.     Lives with wife   Caffeine, 5 cups of coffee a day   Right handed   Social Determinants of Health   Financial Resource Strain: Not on file  Food Insecurity: Not on file  Transportation Needs: Not on file  Physical Activity: Not on file  Stress: Not on file  Social Connections: Not on file  Intimate Partner Violence: Not on file     Current Outpatient Medications:    cephALEXin (KEFLEX) 500 MG capsule, Take 1 capsule (500 mg total) by mouth 4 (four) times daily for 10 days., Disp: 40 capsule, Rfl: 0   cephALEXin (KEFLEX) 500 MG capsule, Take 4  capsules 1 hour prior to dental procedure., Disp: 20 capsule, Rfl: 1   Cyanocobalamin (VITAMIN B12 PO), Take by mouth., Disp: , Rfl:    dapagliflozin propanediol (FARXIGA) 10 MG TABS tablet, Take 1 tablet (10 mg total) by mouth daily., Disp: 90 tablet, Rfl: 2   Dulaglutide (TRULICITY) 1.5 MG/0.5ML SOPN, INJECT 1.5 MG UNDER THE SKIN ONCE WEEKLY, Disp: 6 mL, Rfl: 3   fenofibrate (TRICOR) 145 MG tablet, TAKE ONE TABLET BY MOUTH DAILY, Disp: 90 tablet, Rfl: 2   Glucosamine 500 MG CAPS, Take by mouth., Disp: , Rfl:    ibuprofen (ADVIL) 600 MG tablet, TAKE ONE TABLET BY MOUTH EVERY 8 HOURS AS NEEDED, Disp: 270 tablet, Rfl: 9   Lancets (ONETOUCH DELICA PLUS LANCET33G) MISC, USE TO CHECK BLOOD SUGAR THREE TIMES A DAY, Disp: 300 each, Rfl: PRN   LORazepam (ATIVAN) 0.5 MG tablet, TAKE ONE TABLET BY MOUTH DAILY (Patient taking differently: 0.5 mg every 6 (six) hours as needed.), Disp: 30 tablet, Rfl: 3   losartan (COZAAR) 50 MG tablet, Take 1 tablet (50 mg total) by mouth daily., Disp: 90 tablet, Rfl: 1   meclizine (ANTIVERT) 25 MG tablet, Take 1 tablet (25 mg total) by mouth 3 (three) times daily as needed for dizziness or nausea., Disp: 30 tablet, Rfl: 3   MELATONIN PO, Take by mouth., Disp: , Rfl:    metFORMIN (GLUCOPHAGE-XR) 500 MG 24 hr tablet, Take 2 tablets (1,000 mg total) by mouth 2 (two) times daily., Disp: 360 tablet, Rfl: 3   montelukast (SINGULAIR) 10 MG tablet, TAKE ONE TABLET BY MOUTH AT BEDTIME, Disp: 90 tablet, Rfl: 2   Multiple Vitamins-Minerals (MULTIVITAMIN PO), Take by mouth., Disp: , Rfl:    Omega-3 Fatty Acids (FISH OIL PO), Take by mouth., Disp: , Rfl:    ONETOUCH ULTRA test strip, USE ONE STRIP TO TEST THREE TIMES A DAY, Disp: 250 strip, Rfl: 1   pravastatin (PRAVACHOL) 80 MG tablet, Take 1 tablet (80 mg total) by mouth daily., Disp: 90 tablet, Rfl: 3   temazepam (RESTORIL) 30 MG capsule, TAKE ONE CAPSULE BY MOUTH EVERY NIGHT AT BEDTIME AS NEEDED FOR SLEEP, Disp: 30 capsule, Rfl:  5  EXAM:  VITALS per patient if applicable: There were no vitals taken for this visit.  GENERAL: alert, oriented, appears well and in no acute distress  HEENT: atraumatic, conjunttiva clear, no obvious abnormalities on inspection of external nose and ears  NECK: normal movements of the head and neck  LUNGS: on inspection no signs of respiratory distress, breathing rate appears normal, no obvious gross  SOB, gasping or wheezing  CV: no obvious cyanosis  MS: moves all visible extremities without noticeable abnormality  PSYCH/NEURO: pleasant and cooperative, no obvious depression or anxiety, speech and thought processing grossly intact  ASSESSMENT AND PLAN:  Discussed the following assessment and plan:  Acute bacterial sinusitis Cephalexin has worked well for him in the past for treatment of sinusitis.  We will start 500 mg 4 times daily.  Recommend supportive care with increased fluids and rest.  Instructed to follow-up if not improving after completion of antibiotics.     I discussed the assessment and treatment plan with the patient. The patient was provided an opportunity to ask questions and all were answered. The patient agreed with the plan and demonstrated an understanding of the instructions.   The patient was advised to call back or seek an in-person evaluation if the symptoms worsen or if the condition fails to improve as anticipated.    [name], DO

## 2021-05-01 NOTE — Progress Notes (Signed)
[facility]   HOME SLEEP TEST REPORT ( by Watch PAT)   STUDY DATE:  05-01-2021 DOB:  02-21-1946    ORDERING CLINICIAN: Melvyn Novas, MD  REFERRING CLINICIAN:    CLINICAL INFORMATION/HISTORY: I have the pleasure of seeing Luis Fernandez 03-26-2021, a right-handed Caucasian male with a longstanding diagnosis of OSA sleep disorder.  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    The patient had the first sleep study in the year 2005, the last and only sleep study was done at [facility] physicians sleep center.  the results have been shared with Dr. Cyril Mourning, the ordering physician was Dr. Marjean Donna. Stewart at the time date of the study (05-29-2004 )the patient had an RDI of 16.2 /h, which is a mild degree of apnea,  and his oxygen nadir was 84% saturation.  He received a diagnosis of obstructive sleep apnea . he did have frequent limb movements but none of them were periodic or leading to arousals.   He had about 16.8% REM sleep no deep sleep during the sleep study.  Latency to REM sleep was 96 minutes.   Sleep was not extremely fragmented.  There were no central apneas.   The patient was titrated in another sleep study to a CPAP at a pressure of 9.5 cmH2O this is where his machine today still is set.     Epworth sleepiness score:7 /24. On CPAP.   BMI: 30 kg/m   Neck Circumference: 18"   FINDINGS:   Sleep Summary:   Total Recording Time (hours, min): Total recording time amounted to 9 hours and 5 minutes of which 7 hours and 37 minutes were considered total sleep time there is a proportion of 21.1% REM sleep.                                     Respiratory Indices:   Calculated pAHI (per hour): The overall apnea-hypopnea index was 47.8/h which indicates severe sleep apnea during REM sleep AHI was 41.4/h and  during non-REM sleep 49.5/h.  In supine position the AHI was 48.0/h and in prone sleep 13.9/h.  In left-sided sleep the patient still had an AHI of 89.3/h.  Snoring level was moderate with a mean volume of 41 dB.  The patient snored for about 20% of the total sleep time.                                                            Oxygen Saturation Statistics:   O2 Saturation Range (%): Oxygen saturation ranged between 79% at nadir and a maximum saturation of 98% with a mean saturation of 93%.  Total time in hypoxia under 90% saturation was 10.3 minutes the equivalent of only 2.3% of total sleep time.                                     O2 Saturation (minutes) <89%:  4.1 minutes.         Pulse Rate Statistics:   Pulse  Varied between 51 bpm and 90 bpm with a mean heart rate of 65 bpm.  There were very large variations of the pulse rate noted.             IMPRESSION:  This HST confirms the presence of severe sleep apnea and the patient is considered CPAP dependent.  It was interesting that prone sleep reduced the apnea index significantly but the left lateral sleep position did not.  The patient should definitely continue CPAP treatment given the severity of the apnea .  he currently uses 11 cmH2O pressure , 2 cm EPR.    RECOMMENDATION: currently uses 11 cmH2O pressure with 2 cm EPR.  He does need to be refitted for a mask as he has a lot of air leakage on his current download.   I like for his CPAP to set a range of pressure between 6 and 14 cmH2O 2 cm centimeter EPR.heated humidification.    INTERPRETING PHYSICIAN:   Carmen Dohmeier, MD   Medical Director of [facility].          

## 2021-05-09 NOTE — Progress Notes (Signed)
I, [name], LAT, ATC, am serving as scribe for Dr. [name].  Luis Fernandez is a 75 y.o. 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  He was last seen by Dr. Denyse Amass on 03/07/21 and noted improvement in his L hip after 11 PT sessions but noted con't R hip pain.  He had a R GT steroid injection at his last visit and was advised to con't his HEP.  Today, pt reports he is leaving for [place] on Friday. Pt notes cont bilat hip pain, R>L, and the L hip is generally OK, w/ only intermittent pain.  Diagnostic imaging: R and L hip XR- 01/09/21  Pertinent review of systems: No fevers or chills  Relevant historical information: Pretension and diabetes   Exam:  BP (!) 144/82   Pulse 86   Ht 6\' 3"  (1.905 m)   Wt 237 lb (107.5 kg)   SpO2 96%   BMI 29.62 kg/m  General: Well Developed, well nourished, and in no acute distress.   MSK: Right hip: Normal-appearing Tender palpation at greater trochanter superior aspect Hip abduction strength diminished 4/5.    Lab and Radiology Results  Procedure: Real-time Ultrasound Guided Injection of the right greater trochanter bursa Device: Philips Affiniti 50G Images permanently stored and available for review in PACS Verbal informed consent obtained.  Discussed risks and benefits of procedure. Warned about infection bleeding damage to structures skin hypopigmentation and fat atrophy among others. Patient expresses understanding and agreement Time-out conducted.   Noted no overlying erythema, induration, or other signs of local infection.   Skin prepped in a sterile fashion.   Local anesthesia: Topical Ethyl chloride.   With sterile technique and under real time ultrasound guidance: 40 mg of Kenalog and 2 mL of Marcaine injected into greater trochanter bursa. Fluid seen entering the bursa.   Completed  without difficulty   Pain immediately resolved suggesting accurate placement of the medication.   Advised to call if fevers/chills, erythema, induration, drainage, or persistent bleeding.   Images permanently stored and available for review in the ultrasound unit.  Impression: Technically successful ultrasound guided injection.   EXAM: DG HIP (WITH OR WITHOUT PELVIS) 2-3V RIGHT   COMPARISON:  None.   FINDINGS: There is no evidence of hip fracture or dislocation. Mild osteophyte formation of right hip is noted without significant joint space narrowing.   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 No acute abnormality seen.     Electronically Signed   By: M.D.   On: 01/12/2021 08:38 I, 03/14/2021, personally (independently) visualized and performed the interpretation of the images attached in this note.      Assessment and Plan: 75 y.o. male with right lateral hip pain thought to be due to greater trochanteric bursitis.  Patient had steroid injection about 2 months.  His pain is starting to return.  Plan to proceed with greater trochanter injection early today because he has a trip to [place] later this week.  Because he is somewhat failing conservative management strategies plan also for MRI to be done when he returns from [place].  He already has had a trial of physical therapy for greater than 6 weeks which did help but not sufficiently.  Recheck after MRI. Limited tramadol in case he has worsening pain with his travels.  Cautioned him  against tramadol with the temazepam and other sedating medications.  PDMP reviewed during this encounter. Orders Placed This Encounter  Procedures   [place] LIMITED JOINT SPACE STRUCTURES LOW BILAT(NO LINKED CHARGES)    Standing Status:   Future    Number of Occurrences:   1    Standing Expiration Date:   11/10/2021    Order Specific Question:   Reason for Exam (SYMPTOM  OR DIAGNOSIS REQUIRED)    Answer:    bilateral hip pain    Order Specific Question:   Preferred imaging location?    Answer:   [facility]   MR HIP RIGHT WO CONTRAST    Standing Status:   Future    Standing Expiration Date:   05/12/2022    Order Specific Question:   What is the patient's sedation requirement?    Answer:   No Sedation    Order Specific Question:   Does the patient have a pacemaker or implanted devices?    Answer:   No    Order Specific Question:   Preferred imaging location?    Answer:   [facility] (table limit-350lbs)   Meds ordered this encounter  Medications   traMADol (ULTRAM) 50 MG tablet    Sig: Take 1 tablet (50 mg total) by mouth every 8 (eight) hours as needed for severe pain.    Dispense:  10 tablet    Refill:  0     Discussed warning signs or symptoms. Please see discharge instructions. Patient expresses understanding.   The above documentation has been reviewed and is accurate and complete [name], M.D.

## 2021-05-12 ENCOUNTER — Ambulatory Visit: Payer: Self-pay

## 2021-05-12 ENCOUNTER — Ambulatory Visit (INDEPENDENT_AMBULATORY_CARE_PROVIDER_SITE_OTHER): Payer: Medicare Other | Admitting: Family Medicine

## 2021-05-12 ENCOUNTER — Other Ambulatory Visit: Payer: Self-pay

## 2021-05-12 ENCOUNTER — Encounter: Payer: Self-pay | Admitting: Family Medicine

## 2021-05-12 ENCOUNTER — Ambulatory Visit: Payer: Medicare Other | Admitting: Family Medicine

## 2021-05-12 VITALS — BP 144/82 | HR 86 | Ht 75.0 in | Wt 237.0 lb

## 2021-05-12 DIAGNOSIS — M25551 Pain in right hip: Secondary | ICD-10-CM | POA: Diagnosis not present

## 2021-05-12 DIAGNOSIS — M25552 Pain in left hip: Secondary | ICD-10-CM | POA: Diagnosis not present

## 2021-05-12 MED ORDER — TRAMADOL HCL 50 MG PO TABS: 50.0000 mg | ORAL_TABLET | Freq: Three times a day (TID) | ORAL | 0 refills | Status: DC | PRN

## 2021-05-12 NOTE — Patient Instructions (Addendum)
Thank you for coming in today.   MRI has been ordered. You should hear about scheduling within a week.  Have fun on your trip!!  Check back after your MRI

## 2021-05-13 ENCOUNTER — Encounter: Payer: Self-pay | Admitting: Family Medicine

## 2021-05-15 ENCOUNTER — Encounter: Payer: Self-pay | Admitting: *Deleted

## 2021-05-15 ENCOUNTER — Telehealth: Payer: Self-pay | Admitting: *Deleted

## 2021-05-15 NOTE — Procedures (Signed)
[facility]   HOME SLEEP TEST REPORT ( by Watch PAT)   STUDY DATE:  05-01-2021 DOB:  June 08, 1946    ORDERING CLINICIAN: Melvyn Novas, MD  REFERRING CLINICIAN:    CLINICAL INFORMATION/HISTORY: I have the pleasure of seeing Luis Fernandez 03-26-2021, a right-handed Caucasian male with a longstanding diagnosis of OSA sleep disorder.  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    The patient had the first sleep study in the year 2005, the last and only sleep study was done at [facility] physicians sleep center.  the results have been shared with Dr. Cyril Mourning, the ordering physician was Dr. Marjean Donna. Stewart at the time date of the study (05-29-2004 )the patient had an RDI of 16.2 /h, which is a mild degree of apnea,  and his oxygen nadir was 84% saturation.  He received a diagnosis of obstructive sleep apnea . he did have frequent limb movements but none of them were periodic or leading to arousals.   He had about 16.8% REM sleep no deep sleep during the sleep study.  Latency to REM sleep was 96 minutes.   Sleep was not extremely fragmented.  There were no central apneas.   The patient was titrated in another sleep study to a CPAP at a pressure of 9.5 cmH2O this is where his machine today still is set.     Epworth sleepiness score:7 /24. On CPAP.   BMI: 30 kg/m   Neck Circumference: 18"   FINDINGS:   Sleep Summary:   Total Recording Time (hours, min): Total recording time amounted to 9 hours and 5 minutes of which 7 hours and 37 minutes were considered total sleep time there is a proportion of 21.1% REM sleep.                                     Respiratory Indices:   Calculated pAHI (per hour): The overall apnea-hypopnea index was 47.8/h which indicates severe sleep apnea during REM sleep AHI was 41.4/h and during  non-REM sleep 49.5/h.  In supine position the AHI was 48.0/h and in prone sleep 13.9/h.  In left-sided sleep the patient still had an AHI of 89.3/h.  Snoring level was moderate with a mean volume of 41 dB.  The patient snored for about 20% of the total sleep time.                                                            Oxygen Saturation Statistics:   O2 Saturation Range (%): Oxygen saturation ranged between 79% at nadir and a maximum saturation of 98% with a mean saturation of 93%.  Total time in hypoxia under 90% saturation was 10.3 minutes the equivalent of only 2.3% of total sleep time.                                     O2 Saturation (minutes) <89%:  4.1 minutes.         Pulse Rate Statistics:   Pulse Range:  Varied between 51 bpm and 90 bpm with a mean heart rate of 65 bpm.  There were very large variations of the pulse rate noted.             IMPRESSION:  This HST confirms the presence of severe sleep apnea and the patient is considered CPAP dependent.  It was interesting that prone sleep reduced the apnea index significantly but the left lateral sleep position did not.  The patient should definitely continue CPAP treatment given the severity of the apnea .  he currently uses 11 cmH2O pressure , 2 cm EPR.    RECOMMENDATION: currently uses 11 cmH2O pressure with 2 cm EPR.  He does need to be refitted for a mask as he has a lot of air leakage on his current download.   I like for his CPAP to set a range of pressure between 6 and 14 cmH2O 2 cm centimeter EPR.heated humidification.    INTERPRETING PHYSICIAN:   Melvyn Novas, MD   Medical Director of [facility].

## 2021-05-15 NOTE — Telephone Encounter (Addendum)
I called pt. I advised pt that Dr. Vickey Huger reviewed their sleep study results and found that pt has severe sleep apnea. Dr. Vickey Huger recommends that pt get new cpap. I reviewed PAP compliance expectations with the pt. Pt is agreeable to starting a CPAP. I advised pt that an order will be sent to a DME, [employer], and [employer] will call the pt within about one week after they file with the pt's insurance. [employer] will show the pt how to use the machine, fit for masks, and troubleshoot the CPAP if needed. A follow up appt was made for insurance purposes with Dr. Vickey Huger on 08/19/21 at 2:30p. Pt verbalized understanding to arrive 15 minutes early and bring their CPAP. A letter with all of this information in it will be mailed to the pt as a reminder. I verified with the pt that the address we have on file is correct. Pt verbalized understanding of results. Pt had no questions at this time but was encouraged to call back if questions arise. I have sent the order to [employer] and have received confirmation that they have received the order.  Pt will be leaving for [place] 05/16/21 until 05/29/21. He would like to be contacted after he returns by [employer]. Advised I will send them this message to let them know. He would also like airsense 11 (currently has 10).

## 2021-05-15 NOTE — Addendum Note (Signed)
Addended by: Melvyn Novas on: 05/15/2021 01:21 PM   Modules accepted: Orders

## 2021-05-15 NOTE — Progress Notes (Signed)
IMPRESSION:  This HST confirms the presence of severe sleep apnea and the patient is considered CPAP dependent.  It was interesting that prone sleep reduced the apnea index significantly but the left lateral sleep position did not.  The patient should definitely continue CPAP treatment given the severity of the apnea .  he currently uses 11 cmH2O pressure , 2 cm EPR.    RECOMMENDATION: currently uses 11 cmH2O pressure with 2 cm EPR.  He does need to be refitted for a mask as he has a lot of air leakage on his current download.   I like for his CPAP to set a range of pressure between 6 and 14 cmH2O 2 cm centimeter EPR.heated humidification.   INTERPRETING PHYSICIAN:   Melvyn Novas, MD

## 2021-05-15 NOTE — Telephone Encounter (Signed)
-----   Message from Melvyn Novas, MD sent at 05/15/2021  1:21 PM EDT ----- IMPRESSION:  This HST confirms the presence of severe sleep apnea and the patient is considered CPAP dependent.  It was interesting that prone sleep reduced the apnea index significantly but the left lateral sleep position did not.  The patient should definitely continue CPAP treatment given the severity of the apnea .  he currently uses 11 cmH2O pressure , 2 cm EPR.    RECOMMENDATION: currently uses 11 cmH2O pressure with 2 cm EPR.  He does need to be refitted for a mask as he has a lot of air leakage on his current download.   I like for his CPAP to set a range of pressure between 6 and 14 cmH2O 2 cm centimeter EPR.heated humidification.   INTERPRETING PHYSICIAN:   Melvyn Novas, MD

## 2021-06-07 ENCOUNTER — Other Ambulatory Visit: Payer: Self-pay

## 2021-06-07 ENCOUNTER — Ambulatory Visit (INDEPENDENT_AMBULATORY_CARE_PROVIDER_SITE_OTHER): Payer: Medicare Other

## 2021-06-07 DIAGNOSIS — M1611 Unilateral primary osteoarthritis, right hip: Secondary | ICD-10-CM | POA: Diagnosis not present

## 2021-06-07 DIAGNOSIS — S76311A Strain of muscle, fascia and tendon of the posterior muscle group at thigh level, right thigh, initial encounter: Secondary | ICD-10-CM | POA: Diagnosis not present

## 2021-06-07 DIAGNOSIS — M25551 Pain in right hip: Secondary | ICD-10-CM | POA: Diagnosis not present

## 2021-06-07 DIAGNOSIS — G8929 Other chronic pain: Secondary | ICD-10-CM | POA: Diagnosis not present

## 2021-06-10 NOTE — Progress Notes (Signed)
Hip MRI shows hamstring tendonitis and some mild partial tearing.  Additionally you have some hip arthritis.  Not a lot of trochanteric bursitis is seen.  Recommend return to clinic to go over the results and discuss treatment plan and options from here.  We may want to change exercises some based on the new information.

## 2021-06-12 ENCOUNTER — Other Ambulatory Visit: Payer: Self-pay

## 2021-06-12 ENCOUNTER — Ambulatory Visit (INDEPENDENT_AMBULATORY_CARE_PROVIDER_SITE_OTHER): Payer: Medicare Other | Admitting: Family Medicine

## 2021-06-12 VITALS — BP 138/80 | HR 91 | Ht 75.0 in | Wt 229.6 lb

## 2021-06-12 DIAGNOSIS — M25552 Pain in left hip: Secondary | ICD-10-CM | POA: Diagnosis not present

## 2021-06-12 DIAGNOSIS — M76899 Other specified enthesopathies of unspecified lower limb, excluding foot: Secondary | ICD-10-CM

## 2021-06-12 DIAGNOSIS — M25551 Pain in right hip: Secondary | ICD-10-CM

## 2021-06-12 MED ORDER — TRAMADOL HCL 50 MG PO TABS: 50.0000 mg | ORAL_TABLET | Freq: Three times a day (TID) | ORAL | 0 refills | Status: DC | PRN

## 2021-06-12 NOTE — Patient Instructions (Addendum)
Thank you for coming in today.   Please complete the exercises that the athletic trainer went over with you:  View at [email or web address] using code: [email or web address]  Recheck back in 4 weeks.

## 2021-06-12 NOTE — Progress Notes (Signed)
I, Philbert Riser, LAT, ATC acting as a scribe for [name], MD.  Luis Fernandez is a 75 y.o. male who presents to [facility] at [facility] today for f/u R hip pain and MRI review. Pt was last seen by Dr. Denyse Amass on 05/12/21 and was given a R GT steroid injection, was prescribed tramadol, and advised to proceed to MRI after he returned from his trip. Today, pt reports R hip started hurting again while he was on his trip due to increased walking esp on cobblestones.  Pain primarily is in the lateral hip not much in the posterior hip. He notes today he also needs a reorder of his handicap form and would appreciate a refill of his tramadol as he has a few pills left.  Dx imaging: 06/07/21 R hip MRI  01/09/21 R & L hip XR  Pertinent review of systems: No fevers or chills  Relevant historical information: Diabetes   Exam:  BP 138/80   Pulse 91   Ht 6\' 3"  (1.905 m)   Wt 229 lb 9.6 oz (104.1 kg)   SpO2 98%   BMI 28.70 kg/m  General: Well Developed, well nourished, and in no acute distress.   MSK:  Right hip tender palpation greater trochanter mildly.  Not particularly tender ischial tuberosity.    Lab and Radiology Results EXAM: MR OF THE RIGHT HIP WITHOUT CONTRAST   TECHNIQUE: Multiplanar, multisequence MR imaging was performed. No intravenous contrast was administered.   COMPARISON:  Radiographs 01/09/2021   FINDINGS: Bones: There is no evidence of acute fracture, dislocation or avascular necrosis. The visualized bony pelvis appears normal. The visualized sacroiliac joints and symphysis pubis appear normal. Lower lumbar spondylosis noted.   Articular cartilage and labrum   Articular cartilage:  Mild degenerative changes of both hips.   Labrum: There is no gross labral tear or paralabral abnormality.   Joint or bursal effusion   Joint effusion: No significant hip joint effusion.   Bursae: No focal periarticular fluid collection.   Muscles and  tendons   Muscles and tendons: There is common hamstring tendinosis and partial tearing, right greater than left. The visualized gluteus and iliopsoas tendons appear intact. The piriformis muscles are symmetric.   Other findings   Miscellaneous: The urinary bladder appears heavily trabeculated, suggesting chronic bladder outlet obstruction. There is central heterogeneity of the prostate gland, likely reflecting benign prostatic hypertrophy. The visualized internal pelvic contents otherwise appear unremarkable.   IMPRESSION: 1. Common hamstring tendinosis and partial tearing, right greater left. The additional hip tendons appear unremarkable. 2. Mild degenerative changes of both hips. No acute osseous findings or significant joint effusions. 3. Lower lumbar spondylosis could contribute to hip pain. 4. Trabeculated bladder, likely related to BPH.     Electronically Signed   By: 03/11/2021 M.D.   On: 06/09/2021 16:09   I, 13/01/2021, personally (independently) visualized and performed the interpretation of the images attached in this note.     Assessment and Plan: 75 y.o. male with right hip pain.  Source of pain is a little confounding at this time.  Clinically he presents as though the majority of his pain should be hip abductor tendinopathy or trochanteric bursitis however this is not much seen on the MRI today.  MRI shows hamstring tendinitis at the insertion at ischial tuberosity but he does not have much pain in this area on physical exam. It could be that he is an atypical presentation of hamstring tendinitis and that  is why has not improved much with the typical conservative management strategies. Discussed MRI findings treatment plan and options with Luis Fernandez. And plan for trial of home exercise program focused on hamstrings.  Recheck in a month.  If not better would consider trial of diagnostic and hopefully therapeutic injection at the ischial tuberosity hamstring  insertion site. Completed handicap paperwork.  Temporary refill tramadol.   PDMP reviewed during this encounter. No orders of the defined types were placed in this encounter.  Meds ordered this encounter  Medications   traMADol (ULTRAM) 50 MG tablet    Sig: Take 1 tablet (50 mg total) by mouth every 8 (eight) hours as needed for severe pain.    Dispense:  10 tablet    Refill:  0     Discussed warning signs or symptoms. Please see discharge instructions. Patient expresses understanding.   The above documentation has been reviewed and is accurate and complete [name], M.D.  Total encounter time 20 minutes including face-to-face time with the patient and, reviewing past medical record, and charting on the date of service.   Treatment plan and options and MRI review.

## 2021-06-23 ENCOUNTER — Other Ambulatory Visit: Payer: Self-pay

## 2021-06-23 DIAGNOSIS — E1142 Type 2 diabetes mellitus with diabetic polyneuropathy: Secondary | ICD-10-CM

## 2021-06-23 MED ORDER — ONETOUCH DELICA PLUS LANCET33G MISC: 99 refills | Status: DC

## 2021-06-30 ENCOUNTER — Other Ambulatory Visit: Payer: Self-pay | Admitting: Family Medicine

## 2021-07-03 NOTE — Progress Notes (Signed)
I, Philbert Riser, LAT, ATC acting as a scribe for [name], MD.  Luis Fernandez is a 75 y.o. male who presents to [facility] at [facility] today for f/u of R hip pain.  He was last seen by Dr. Denyse Amass on 06/12/21 and was shown a HEP focusing on h/s strengthening.  He had a prior R GT injectio on 05/12/21 and was prescribed Tramadol.  Today, pt reports R hip is not doing well. Pt has been working on [employer] and feels he has worsened over the last few days. Pt locates pain to the R glute.  Dx imaging: 06/07/21 R hip MRI             01/09/21 R & L hip XR  Pertinent review of systems: No fevers or chills  Relevant historical information: Hypertension and diabetes   Exam:  BP 128/78   Pulse 80   Ht 6\' 3"  (1.905 m)   Wt 232 lb (105.2 kg)   SpO2 98%   BMI 29.00 kg/m  General: Well Developed, well nourished, and in no acute distress.   MSK: Right hip tender palpation ischial tuberosity    Lab and Radiology Results  Procedure: Real-time Ultrasound Guided Injection of right ischial tuberosity (bursa) at hamstring insertion Device: Philips Affiniti 50G Images permanently stored and available for review in PACS Verbal informed consent obtained.  Discussed risks and benefits of procedure. Warned about infection bleeding damage to structures skin hypopigmentation and fat atrophy among others. Patient expresses understanding and agreement Time-out conducted.   Noted no overlying erythema, induration, or other signs of local infection.   Skin prepped in a sterile fashion.   Local anesthesia: Topical Ethyl chloride.   With sterile technique and under real time ultrasound guidance: 40 mg of Kenalog and 2 mL of Marcaine injected into ischial tuberosity bursa at hamstring insertion. Fluid seen entering the bursa.   Completed without difficulty   Pain immediately resolved suggesting accurate placement of the medication.   Advised to call if fevers/chills, erythema, induration,  drainage, or persistent bleeding.   Images permanently stored and available for review in the ultrasound unit.  Impression: Technically successful ultrasound guided injection.     EXAM: MR OF THE RIGHT HIP WITHOUT CONTRAST   TECHNIQUE: Multiplanar, multisequence MR imaging was performed. No intravenous contrast was administered.   COMPARISON:  Radiographs 01/09/2021   FINDINGS: Bones: There is no evidence of acute fracture, dislocation or avascular necrosis. The visualized bony pelvis appears normal. The visualized sacroiliac joints and symphysis pubis appear normal. Lower lumbar spondylosis noted.   Articular cartilage and labrum   Articular cartilage:  Mild degenerative changes of both hips.   Labrum: There is no gross labral tear or paralabral abnormality.   Joint or bursal effusion   Joint effusion: No significant hip joint effusion.   Bursae: No focal periarticular fluid collection.   Muscles and tendons   Muscles and tendons: There is common hamstring tendinosis and partial tearing, right greater than left. The visualized gluteus and iliopsoas tendons appear intact. The piriformis muscles are symmetric.   Other findings   Miscellaneous: The urinary bladder appears heavily trabeculated, suggesting chronic bladder outlet obstruction. There is central heterogeneity of the prostate gland, likely reflecting benign prostatic hypertrophy. The visualized internal pelvic contents otherwise appear unremarkable.   IMPRESSION: 1. Common hamstring tendinosis and partial tearing, right greater left. The additional hip tendons appear unremarkable. 2. Mild degenerative changes of both hips. No acute osseous findings or significant joint  effusions. 3. Lower lumbar spondylosis could contribute to hip pain. 4. Trabeculated bladder, likely related to BPH.     Electronically Signed   By: Carey Bullocks M.D.   On: 06/09/2021 16:09   I, [name], personally  (independently) visualized and performed the interpretation of the images attached in this note.     Assessment and Plan: 75 y.o. male with right lateral and posterior hip pain.  This has been ongoing for over a year.  He has had extensive trials of conservative management including trials of PT and home exercise program mostly focused at hip abductor as that is what it seems to be most likely initially.  Additionally has had lateral hip injections for trochanteric bursitis.  However eventually had an MRI which did not show bursitis but did show insertional hamstring tendinitis.  He does not have a lot of pain at this area so the MRI findings were a bit of a surprise.  Over the last month he has been doing home exercise programs focused on hamstring and notes that this does seem to reproduce his pain a bit which does support proximal hamstring tendinitis as a diagnosis.  Discussion proceeded to trial of diagnostic and therapeutic injection. He had immediate and fantastic response to injection today indicating that his proximal hamstring/ischial tuberosity is the source of his pain.  Hopefully will have a good response to the steroid component.  He should continue the home exercise program and may consider actual physical therapy for this dedicated issue in the future.  Additionally may consider PRP going forward.   PDMP not reviewed this encounter. Orders Placed This Encounter  Procedures   [place] LIMITED JOINT SPACE STRUCTURES LOW RIGHT(NO LINKED CHARGES)    Standing Status:   Future    Number of Occurrences:   1    Standing Expiration Date:   01/02/2022    Order Specific Question:   Reason for Exam (SYMPTOM  OR DIAGNOSIS REQUIRED)    Answer:   right hip pain    Order Specific Question:   Preferred imaging location?    Answer:   [facility]   No orders of the defined types were placed in this encounter.    Discussed warning signs or symptoms. Please see discharge  instructions. Patient expresses understanding.   The above documentation has been reviewed and is accurate and complete [name], M.D.

## 2021-07-04 ENCOUNTER — Other Ambulatory Visit: Payer: Self-pay | Admitting: Family Medicine

## 2021-07-04 ENCOUNTER — Other Ambulatory Visit: Payer: Self-pay

## 2021-07-04 ENCOUNTER — Ambulatory Visit: Payer: Self-pay

## 2021-07-04 ENCOUNTER — Ambulatory Visit (INDEPENDENT_AMBULATORY_CARE_PROVIDER_SITE_OTHER): Payer: Medicare Other | Admitting: Family Medicine

## 2021-07-04 VITALS — BP 128/78 | HR 80 | Ht 75.0 in | Wt 232.0 lb

## 2021-07-04 DIAGNOSIS — E785 Hyperlipidemia, unspecified: Secondary | ICD-10-CM

## 2021-07-04 DIAGNOSIS — M25551 Pain in right hip: Secondary | ICD-10-CM | POA: Diagnosis not present

## 2021-07-04 DIAGNOSIS — M76899 Other specified enthesopathies of unspecified lower limb, excluding foot: Secondary | ICD-10-CM | POA: Diagnosis not present

## 2021-07-04 NOTE — Patient Instructions (Addendum)
Thank you for coming in today.   You received an injection today. Seek immediate medical attention if the joint becomes red, extremely painful, or is oozing fluid.   Please let me know if this injection does not help and will proceed to xray and MRI  I think we found the pain generator.   Keep up the exercises.   ischial tuberosity seat cushion

## 2021-07-10 ENCOUNTER — Ambulatory Visit: Payer: Medicare Other | Admitting: Family Medicine

## 2021-07-10 ENCOUNTER — Encounter: Payer: Self-pay | Admitting: Family Medicine

## 2021-07-16 ENCOUNTER — Other Ambulatory Visit: Payer: Self-pay | Admitting: Family Medicine

## 2021-07-21 ENCOUNTER — Ambulatory Visit (INDEPENDENT_AMBULATORY_CARE_PROVIDER_SITE_OTHER): Payer: Medicare Other | Admitting: Family Medicine

## 2021-07-21 ENCOUNTER — Encounter: Payer: Self-pay | Admitting: Family Medicine

## 2021-07-21 ENCOUNTER — Other Ambulatory Visit: Payer: Self-pay

## 2021-07-21 VITALS — BP 134/81 | HR 80 | Temp 98.0°F | Ht 75.0 in | Wt 230.0 lb

## 2021-07-21 DIAGNOSIS — I152 Hypertension secondary to endocrine disorders: Secondary | ICD-10-CM | POA: Diagnosis not present

## 2021-07-21 DIAGNOSIS — E785 Hyperlipidemia, unspecified: Secondary | ICD-10-CM | POA: Diagnosis not present

## 2021-07-21 DIAGNOSIS — E1159 Type 2 diabetes mellitus with other circulatory complications: Secondary | ICD-10-CM

## 2021-07-21 DIAGNOSIS — G4700 Insomnia, unspecified: Secondary | ICD-10-CM | POA: Diagnosis not present

## 2021-07-21 DIAGNOSIS — E1142 Type 2 diabetes mellitus with diabetic polyneuropathy: Secondary | ICD-10-CM

## 2021-07-21 LAB — POCT GLYCOSYLATED HEMOGLOBIN (HGB A1C): HbA1c, POC (controlled diabetic range): 6.6 % (ref 0.0–7.0)

## 2021-07-21 MED ORDER — BENZONATATE 100 MG PO CAPS
100.0000 mg | ORAL_CAPSULE | Freq: Three times a day (TID) | ORAL | 1 refills | Status: DC | PRN
Start: 2021-07-21 — End: 2021-08-19

## 2021-07-21 NOTE — Assessment & Plan Note (Signed)
We will continue temazepam at current strength.  Tolerating well without side effects.

## 2021-07-21 NOTE — Progress Notes (Signed)
[place] - 75 y.o. male MRN [id number]  Date of birth: 01-07-1946  Subjective Chief Complaint  Patient presents with   Diabetes   Hypertension    HPI Luis Fernandez is a 75 year old male here today for follow-up of medical conditions.  He has had continued dry cough since having COVID at the end of October.  Denies dyspnea associated with this.  Doing well with current medications for management of hypertension.  No side effects noted with this.  Denies chest pain, shortness of breath, palpitations, headaches or vision changes.  Diabetes remains fairly well controlled however is up slightly from last visit.  He has received steroid injections x3 since his last visit for musculoskeletal injuries.  He feels like this may be contributing to his elevated blood sugars.  Tolerating current medications well.  No symptoms of hypoglycemia.  Continues to do well with pravastatin for associated hyperlipidemia.  Temazepam continues to work well for sleep.  Denies oversedation or other side effects with this.  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   On medical reccords     Past Surgical History:  Procedure Laterality Date   CARPAL TUNNEL RELEASE     CATARACT EXTRACTION     NOSE SURGERY     TOTAL KNEE ARTHROPLASTY     UMBILICAL HERNIA REPAIR      Social History   Socioeconomic History   Marital status: Married    Spouse name: Johnny Bridge   Number of children: 1   Years of education: 19   Highest education level: Master's degree (e.g., MA, MS, MEng, MEd, MSW, MBA)  Occupational History   Occupation: Clinical research associate    Comment: retired  Tobacco Use   Smoking status: Former   Smokeless tobacco: Never  Building services engineer  Use: Never used  Substance and Sexual Activity   Alcohol use: Yes    Alcohol/week: 3.0 standard drinks    Types: 3 Cans of beer per week    Comment: 2-3 beers a week, occasionally/glass of wine 2 times weekly   Drug use: No   Sexual activity: Yes  Other Topics Concern   Not on file  Social History Narrative   04/26/2021 - lives at home with wife - walks at least a mile daily and gym 3-4 times weekly - walks with dog - wants to travel more and likes to garden             Walks his dog for 1 hour every day. Goes to the gym as well and strength training and aerobic exercise.     Lives with wife   Caffeine, 5 cups of coffee a day   Right handed   Social Determinants of Health   Financial Resource Strain: Not on file  Food Insecurity: Not on file  Transportation Needs: Not on file  Physical Activity: Not on file  Stress: Not on file  Social Connections: Not on file    Family History  Problem Relation Age of Onset   Heart disease Mother    Hypertension Mother    Parkinson's disease Father    Dementia Brother    Hypertension Brother    Depression Brother    COPD  Brother    Colon cancer Neg Hx    Stomach cancer Neg Hx    Rectal cancer Neg Hx    Pancreatic cancer Neg Hx    Esophageal cancer Neg Hx     Health Maintenance  Topic Date Due   OPHTHALMOLOGY EXAM  08/01/2021   HEMOGLOBIN A1C  01/19/2022   FOOT EXAM  07/21/2022   TETANUS/TDAP  05/13/2029   Pneumonia Vaccine 72+ Years old  Completed   INFLUENZA VACCINE  Completed   COVID-19 Vaccine  Completed   Hepatitis C Screening  Completed   Zoster Vaccines- Shingrix  Completed   HPV VACCINES  Aged Out   Fecal DNA (Cologuard)  Discontinued     ----------------------------------------------------------------------------------------------------------------------------------------------------------------------------------------------------------------- Physical Exam BP 134/81 (BP Location: Left Arm, Patient Position:  Sitting, Cuff Size: Normal)    Pulse 80    Temp 98 F (36.7 C)    Ht 6\' 3"  (1.905 m)    Wt 230 lb (104.3 kg)    SpO2 97%    BMI 28.75 kg/m   Physical Exam Constitutional:      Appearance: Normal appearance.  Eyes:     General: No scleral icterus. Cardiovascular:     Rate and Rhythm: Normal rate and regular rhythm.  Pulmonary:     Effort: Pulmonary effort is normal.     Breath sounds: Normal breath sounds.  Musculoskeletal:     Cervical back: Neck supple.  Neurological:     General: No focal deficit present.     Mental Status: He is alert.  Psychiatric:        Mood and Affect: Mood normal.        Behavior: Behavior normal.    ------------------------------------------------------------------------------------------------------------------------------------------------------------------------------------------------------------------- Assessment and Plan  Hypertension associated with diabetes (HCC) Blood pressure remains well controlled at this time.  Continue current medication for management of hypertension.  Low-sodium diet encouraged.  Diabetes with neurologic complications (HCC) Diabetes remains fairly well controlled at this time.  Recommend continuation of current medications.  I would expect his A1c to return back to his baseline over the next few months.  Hyperlipidemia LDL goal <70 Tolerating pravastatin well.  Continue current strength.  Insomnia We will continue temazepam at current strength.  Tolerating well without side effects.   Meds ordered this encounter  Medications   benzonatate (TESSALON) 100 MG capsule    Sig: Take 1-2 capsules (100-200 mg total) by mouth 3 (three) times daily as needed for cough.    Dispense:  30 capsule    Refill:  1    Return in about 6 months (around 01/19/2022) for HTN/DM.    This visit occurred during the SARS-CoV-2 public health emergency.  Safety protocols were in place, including screening questions prior to the visit,  additional usage of staff PPE, and extensive cleaning of exam room while observing appropriate contact time as indicated for disinfecting solutions.

## 2021-07-21 NOTE — Assessment & Plan Note (Signed)
Blood pressure remains well controlled at this time.  Continue current medication for management of hypertension.  Low-sodium diet encouraged.

## 2021-07-21 NOTE — Assessment & Plan Note (Signed)
Diabetes remains fairly well controlled at this time.  Recommend continuation of current medications.  I would expect his A1c to return back to his baseline over the next few months.

## 2021-07-21 NOTE — Assessment & Plan Note (Signed)
Tolerating pravastatin well.  Continue current strength.

## 2021-07-28 ENCOUNTER — Other Ambulatory Visit: Payer: Self-pay | Admitting: Family Medicine

## 2021-08-19 ENCOUNTER — Ambulatory Visit (INDEPENDENT_AMBULATORY_CARE_PROVIDER_SITE_OTHER): Payer: Medicare Other | Admitting: Neurology

## 2021-08-19 ENCOUNTER — Encounter: Payer: Self-pay | Admitting: Neurology

## 2021-08-19 VITALS — BP 136/84 | HR 99 | Ht 75.0 in | Wt 231.0 lb

## 2021-08-19 DIAGNOSIS — Z9989 Dependence on other enabling machines and devices: Secondary | ICD-10-CM

## 2021-08-19 DIAGNOSIS — I152 Hypertension secondary to endocrine disorders: Secondary | ICD-10-CM

## 2021-08-19 DIAGNOSIS — J329 Chronic sinusitis, unspecified: Secondary | ICD-10-CM

## 2021-08-19 DIAGNOSIS — G4733 Obstructive sleep apnea (adult) (pediatric): Secondary | ICD-10-CM | POA: Insufficient documentation

## 2021-08-19 DIAGNOSIS — E1159 Type 2 diabetes mellitus with other circulatory complications: Secondary | ICD-10-CM | POA: Diagnosis not present

## 2021-08-19 DIAGNOSIS — U071 COVID-19: Secondary | ICD-10-CM | POA: Diagnosis not present

## 2021-08-19 DIAGNOSIS — I739 Peripheral vascular disease, unspecified: Secondary | ICD-10-CM | POA: Diagnosis not present

## 2021-08-19 NOTE — Progress Notes (Addendum)
SLEEP MEDICINE CLINIC    Provider:  Melvyn Novas, MD  Primary Care Physician:  [name], DO [address] [facility] [address] [place] [address]     Referring Provider: [name], Do [address] [place],  [place] [address]          Chief Complaint according to patient   Patient presents with:     New Patient (Initial Visit)     Inter-Internal referral for OSA. OSA confirmed,  currently using CPAP. Pt reports doing well, pt has a travel cpap he uses at times.       HISTORY OF PRESENT ILLNESS:  Luis Fernandez is a 76 y.o. Caucasian male  and retired Education administrator- TEFL teacher-, and he is seen here in RV 08/19/2021:  08-19-2021:  Mr. Moronta underwent testing by home sleep test device on 01 May 2021 his apnea was confirmed it was actually still severe with an AHI of 47.8.  It was a little stronger during non-REM sleep than during REM sleep.  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  The patient had only a moderate volume of snoring 41 dB and snoring accompanied only about 20% of total access the situation.  The 95th percentile has remained at 11.5 cmH2O very close to the previous settings.  His air leak at the 95th percentile is high 36.7 L/min.  The patient does have facial hair.  4 minutes were spent in oxygen desaturation which is clinically not significant.  Heart rate varied between 51 and 90 bpm, he was a previous user of 11 cm water pressure CPAP was to his pressure CPAP was 2 cm EPR I ordered for him to receive a new machine with an autotitration range between 6 and 14 cmH2O pressure also was 2 cm water expiratory pressure relief, heated humidification and a mask of the patient's choice. He chose a nasal pillow.  He reports he is completely using up all water from the small water chamber of the Air Sense 11.   The patient left for [place] between the 14th and 27  October unfortunately even as a fully vaccinated patient he contracted COVID there.  He has been able to use the new CPAP compliantly ever since, is using it on average 8 hours and 9 minutes a day 100% compliant by days and hours.  Settings as quoted above residual AHI is 4.5.  There is an unknown number of apneas 0.9/h that he have to deduct from his AHI these are events that occur when there is a high air leak and the machine cannot   Originally referred on 03-26-2021 from new PCP. Chief concern according to patient:  " I was last tested in 2005, my only sleep stud yand my CPAP has been replaced in 2015 , and is still working. I am using a nasal gel comfort mask. I also have a travel machine. "   I have the pleasure of seeing Vahan Wadsworth 03-26-2021, a right-handed Caucasian male with a longstanding diagnosis of OSA sleep disorder.  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   The patient had the first sleep study in the year 2005, the last and only sleep study was done at [facility] sleep center.  the results have been shared with Dr. Cyril Mourningakesh Alva, the ordering physician was Dr. Marjean DonnaBruce M. Stewart at the time date of the study (05-29-2004 )the patient had an RDI of 16.2 /h, which is a mild degree of apnea,  and his oxygen nadir was 84% saturation.  He received a diagnosis of obstructive sleep apnea . he did have frequent limb movements but none of them were periodic or leading to arousals.   He had about 16.8% REM sleep no deep sleep during the sleep study.  Latency to REM sleep was 96 minutes.   Sleep was not extremely fragmented.  There were no central apneas.   The patient was titrated in another sleep study to a CPAP at a pressure of 9.5 cmH2O this is where his machine today  still is set.  The patient's compliance data for today's use of CPAP 100% compliance by days and hours with an average of 7 hours and 38 minutes per night.  The machine right now is set at 11 cm water pressure with 2 cm expiratory relief his residual AHI is 4.0 he does have high air leakage 95th percentile is 17 L/min.  He does have equal amounts of central and obstructive apneas arising.   Sleep relevant medical history: deviate septum repair, sinus surgery.   Family medical /sleep history: NO other family member on CPAP with OSA, insomnia, sleep walkers.    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   and lives in a household with spouse, one adult son, 3 grands.   persons/ alone. One dog  present. Tobacco use/.  quit 1980. ETOH use ; 2 beers a week,  Caffeine intake in form of Coffee( 5-6 / day) Soda( /) Tea ( /) or energy drinks. Regular exercise in form of GYM, 3 days a week, walking .   Hobbies :reading,      Sleep habits are as follows: The patient's dinner time is between 5-7 PM. The patient goes to bed at 10-11 PM and takes  a sleep aid, restoril, continues to sleep for 7 hours, wakes rarely for bathroom breaks. The preferred sleep position is supine, with the support of 1 pillow. Rare GERD. Dreams are reportedly infrequent/vivid.   7.30  AM is the usual rise time. The patient wakes up spontaneously/. He reports not feeling refreshed or restored in AM, with symptoms such as dry mouth, morning headaches, and residual fatigue. Sinus pressure.  Naps are taken infrequently.    Review of Systems: Out of a complete 14 system review, the patient complains of only the following symptoms, and all other reviewed systems are negative.:   ON CPAP and  feeling good. ! Travel s with a travel CPAP, new S 11 CPAP was issued in late 2022.    How likely are you to doze in the following situations: 0 = not likely, 1 =  slight chance, 2 = moderate chance, 3 = high chance   Sitting and Reading? Watching Television? Sitting inactive in a public place (theater or meeting)? As a passenger in a car for an hour without a break? Lying down in the afternoon when circumstances permit? Sitting and talking to someone? Sitting quietly after lunch without alcohol? In a car, while stopped for a few minutes in traffic?   Total = 3 - down from 7-/ 24 points   postviral fatigue after October Covid 19. Antiviral therapy.  FSS endorsed at 32/ 63  points.   GDS 4 / 15 points.  Hoarseness, nasal deviation.   Social History   Socioeconomic History   Marital status: Married    Spouse name: [name]   Number of children: 1   Years of education: 19   Highest education level: Master's degree (e.g., MA, MS, MEng, MEd, MSW, MBA)  Occupational History   Occupation: Clinical research associateLawyer    Comment: retired  Tobacco Use   Smoking status: Former   Smokeless tobacco: Never  Building services engineerVaping Use   Vaping Use: Never used  Substance and Sexual Activity   Alcohol use: Yes    Alcohol/week: 3.0 standard drinks    Types: 3 Cans of beer per week    Comment: 2-3 beers a week, occasionally/glass of wine 2 times weekly   Drug use: No   Sexual activity: Yes  Other Topics Concern   Not on file  Social History Narrative   04/26/2021 - lives at home with wife - walks at least a mile daily and gym 3-4 times weekly - walks with dog - wants to travel more and likes to garden             Walks his dog for 1 hour every day. Goes to the gym as well and strength training and aerobic exercise.     Lives with wife   Caffeine, 5 cups of coffee a day   Right handed   Social Determinants of Health   Financial Resource Strain: Not on file  Food Insecurity: Not on file  Transportation Needs: Not on file  Physical Activity: Not on file  Stress: Not on file  Social Connections: Not on file    Family History  Problem Relation Age of Onset   Heart disease  Mother    Hypertension Mother    Parkinson's disease Father    Dementia Brother    Hypertension Brother    Depression Brother    COPD Brother    Colon cancer Neg Hx    Stomach cancer Neg Hx    Rectal cancer Neg Hx    Pancreatic cancer Neg Hx    Esophageal cancer Neg Hx     Past Medical History:  Diagnosis Date   Arthritis    Diabetes mellitus without complication (HCC)    Hypertension    Steroid-induced adrenal suppression (HCC) 01/21/2016   On medical reccords     Past Surgical History:  Procedure Laterality Date   CARPAL TUNNEL RELEASE     CATARACT EXTRACTION     NOSE SURGERY     TOTAL KNEE ARTHROPLASTY     UMBILICAL HERNIA REPAIR       Current Outpatient Medications on File Prior to Visit  Medication Sig Dispense Refill   cephALEXin (KEFLEX) 500 MG capsule Take 4 capsules 1 hour prior to dental procedure. 20 capsule 1   Cyanocobalamin (VITAMIN B12 PO) Take by mouth.     dapagliflozin propanediol (FARXIGA) 10 MG TABS tablet Take 1 tablet (10 mg total) by mouth daily. 90 tablet 2   Dulaglutide (TRULICITY) 1.5 MG/0.5ML SOPN INJECT 1.5 MG UNDER THE SKIN ONCE WEEKLY 6 mL 3   fenofibrate (TRICOR) 145 MG tablet TAKE ONE TABLET BY MOUTH DAILY 90 tablet 2   Glucosamine 500 MG CAPS Take by mouth.     ibuprofen (ADVIL) 600 MG tablet TAKE ONE TABLET BY MOUTH EVERY 8 HOURS AS NEEDED 270 tablet 9   Lancets (ONETOUCH DELICA PLUS LANCET33G) MISC USE TO CHECK BLOOD SUGAR THREE TIMES A DAY 300  each PRN   LORazepam (ATIVAN) 0.5 MG tablet TAKE ONE TABLET BY MOUTH DAILY (Patient taking differently: 0.5 mg every 6 (six) hours as needed.) 30 tablet 3   losartan (COZAAR) 50 MG tablet TAKE ONE TABLET BY MOUTH DAILY 90 tablet 1   MELATONIN PO Take by mouth.     metFORMIN (GLUCOPHAGE-XR) 500 MG 24 hr tablet Take 2 tablets (1,000 mg total) by mouth 2 (two) times daily. 360 tablet 3   montelukast (SINGULAIR) 10 MG tablet TAKE ONE TABLET BY MOUTH EVERY NIGHT AT BEDTIME 90 tablet 2   Multiple  Vitamins-Minerals (MULTIVITAMIN PO) Take by mouth.     Omega-3 Fatty Acids (FISH OIL PO) Take by mouth.     ONETOUCH ULTRA test strip USE ONE STRIP TO TEST THREE TIMES A DAY 250 strip 1   pravastatin (PRAVACHOL) 80 MG tablet TAKE ONE TABLET BY MOUTH DAILY 90 tablet 3   temazepam (RESTORIL) 30 MG capsule TAKE ONE CAPSULE BY MOUTH EVERY NIGHT AT BEDTIME AS NEEDED FOR SLEEP 30 capsule 5   No current facility-administered medications on file prior to visit.  Physical exam:  Today's Vitals   08/19/21 1416  BP: 136/84  Pulse: 99  Weight: 231 lb (104.8 kg)  Height: 6\' 3"  (1.905 m)   Body mass index is 28.87 kg/m.   Wt Readings from Last 3 Encounters:  08/19/21 231 lb (104.8 kg)  07/21/21 230 lb (104.3 kg)  07/04/21 232 lb (105.2 kg)     Ht Readings from Last 3 Encounters:  08/19/21 6\' 3"  (1.905 m)  07/21/21 6\' 3"  (1.905 m)  07/04/21 6\' 3"  (1.905 m)      General: The patient is awake, alert and appears not in acute distress. The patient is well groomed.  Facial hair, long hair.  Head: Normocephalic, atraumatic. Neck is supple. Mallampati 1,  neck circumference:18 inches . Nasal airflow barely patent.  Retrognathia is not  seen.  Dental status: biological teeth and one bridge - front teeth Cardiovascular:  Regular rate and cardiac rhythm by pulse,  without distended neck veins. Respiratory: Lungs are clear to auscultation.  Skin:  Without evidence of ankle edema, or rash.  Varicose veins. Trunk: The patient's posture is erect.   Neurologic exam : The patient is awake and alert, oriented to place and time.   Memory subjective described as intact.  Attention span & concentration ability appears normal.  Speech is fluent,  without  dysarthria, dysphonia or aphasia.  Mood and affect are appropriate.   Cranial nerves: no loss of smell or taste reported  Pupils are equal and briskly reactive to light. Funduscopic exam deferred .  Extraocular movements in vertical and horizontal  planes were intact . Visual fields by finger perimetry are intact. Hearing was intact to soft voice and finger rubbing.    Facial sensation intact to fine touch.  Facial motor strength is symmetric and tongue and uvula move midline.  Neck ROM : rotation, tilt and flexion extension were normal for age and shoulder shrug was symmetrical.    Motor exam:  Symmetric bulk, tone and ROM.   Normal tone without cog -wheeling, symmetric grip strength . Sensory:  Fine touch, pinprick and vibration were tested  and  normal.  Proprioception tested in the upper extremities was normal Coordination: Rapid alternating movements in the fingers/hands were of normal speed.  The Finger-to-nose maneuver was intact action tremor.bilaterally seen with action not at rest, low amplitude.   Gait and station: Patient could rise unassisted  from a seated position, walked without assistive device.  Stance is of normal width/ base and the patient turned with  steps.  Toe and heel walk were deferred.  Deep tendon reflexes: in the  upper and lower extremities are intact. 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  Right hip pain gives him ROM restriction. Babinski response was deferred .       After spending a total time of  20.5  minutes face to face and additional time for physical and neurologic examination, review of laboratory studies,  personal review of imaging studies, reports and results of other testing and review of referral information / records as far as provided in visit, I have established the following assessments:  1) Mr. Kitchings has hypertension which has been well controlled with the current medications, he is taking pravastatin for hyperlipidemia, he has actually lost weight over the last documented 12 months.   He has severe OSA- confirmed by HST , and is highly compliant with his new CPAP machine, issued 06-03-2021. Right after his [place] trip, which ended with a covid infection.  2) he has not  mentioned his insomnia today- I would be very happy if he can d/c regular benzodiazepine.       My Plan is to proceed with:  1) nasal patency is good, nasal pillow well tolerated. 7-17 cm water auto CPAP.  Highly compliant CPAP user, for many years now on S 11 ResMed machine.  I would like to thank [name], DO  for allowing me to meet with and to take care of this pleasant patient.   In short, Evertte Sones is presenting with almost 2 decades of CPAP use and with a new machine.  I plan to follow up either personally or through our NP every 12 months.   CC: I will share my notes with PCP.  Electronically signed by: Melvyn Novas, MD 08/19/2021 3:12 PM  [facility] Board certified by The [employer] of Sleep Medicine and Diplomate of the [employer] of Sleep Medicine. Board certified In Neurology through the ABPN, Fellow of the [employer] of Neurology. Medical Director of [facility].

## 2021-08-19 NOTE — Patient Instructions (Signed)

## 2021-08-26 ENCOUNTER — Other Ambulatory Visit: Payer: Self-pay | Admitting: Family Medicine

## 2021-09-01 IMAGING — DX DG HIP (WITH OR WITHOUT PELVIS) 2-3V*R*
3 series · 3 of 3 positions shown · non-contrast
Comparison: None.

CLINICAL DATA: Bilateral hip pain for several weeks.

EXAM:
DG HIP (WITH OR WITHOUT PELVIS) 2-3V RIGHT

[pelvis ap]
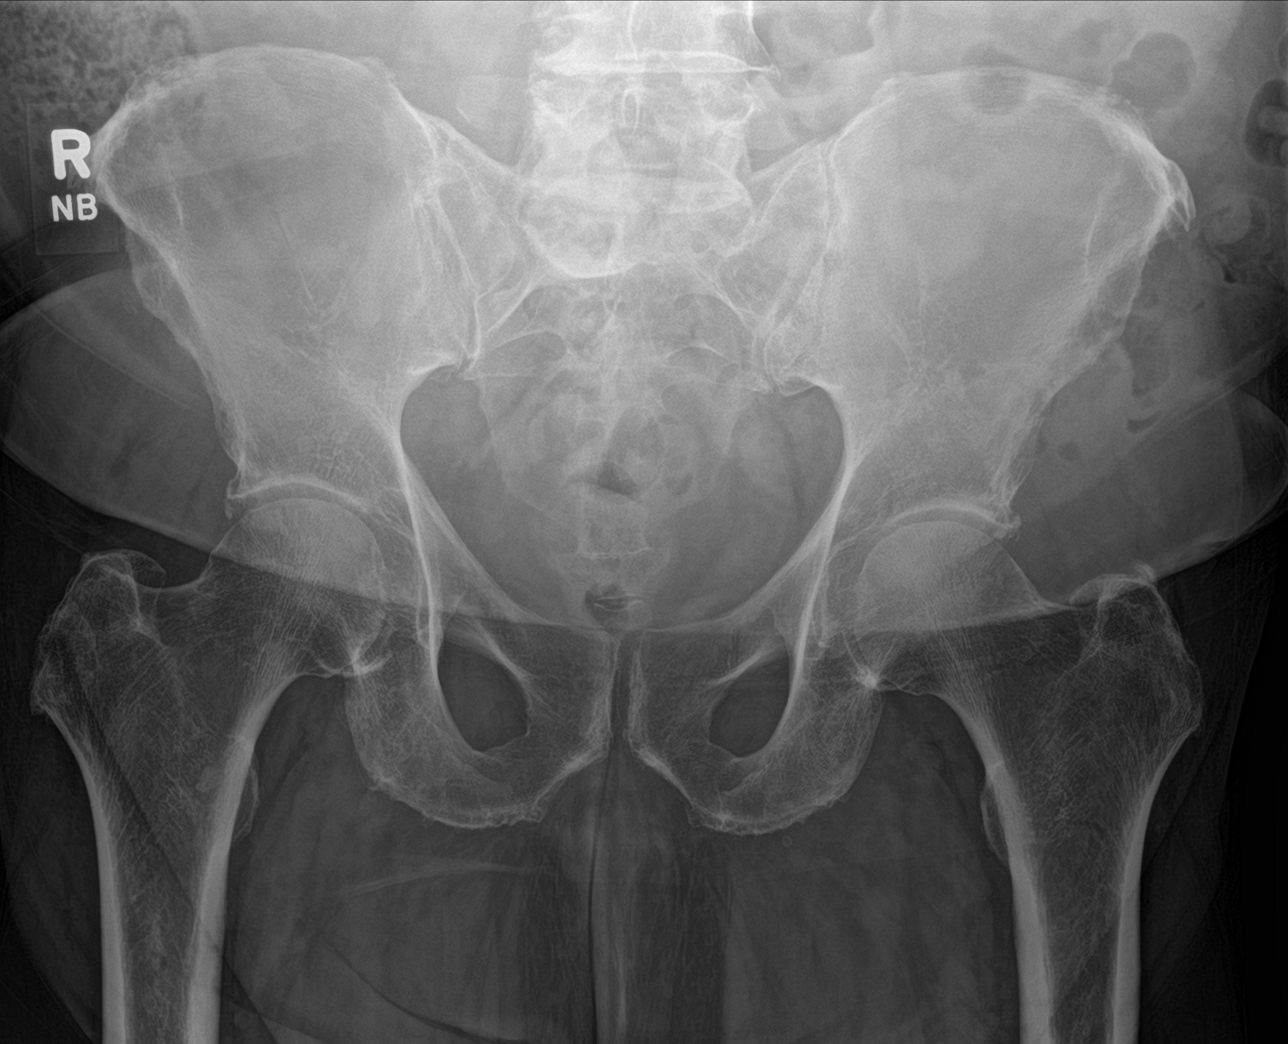

[hip ap]
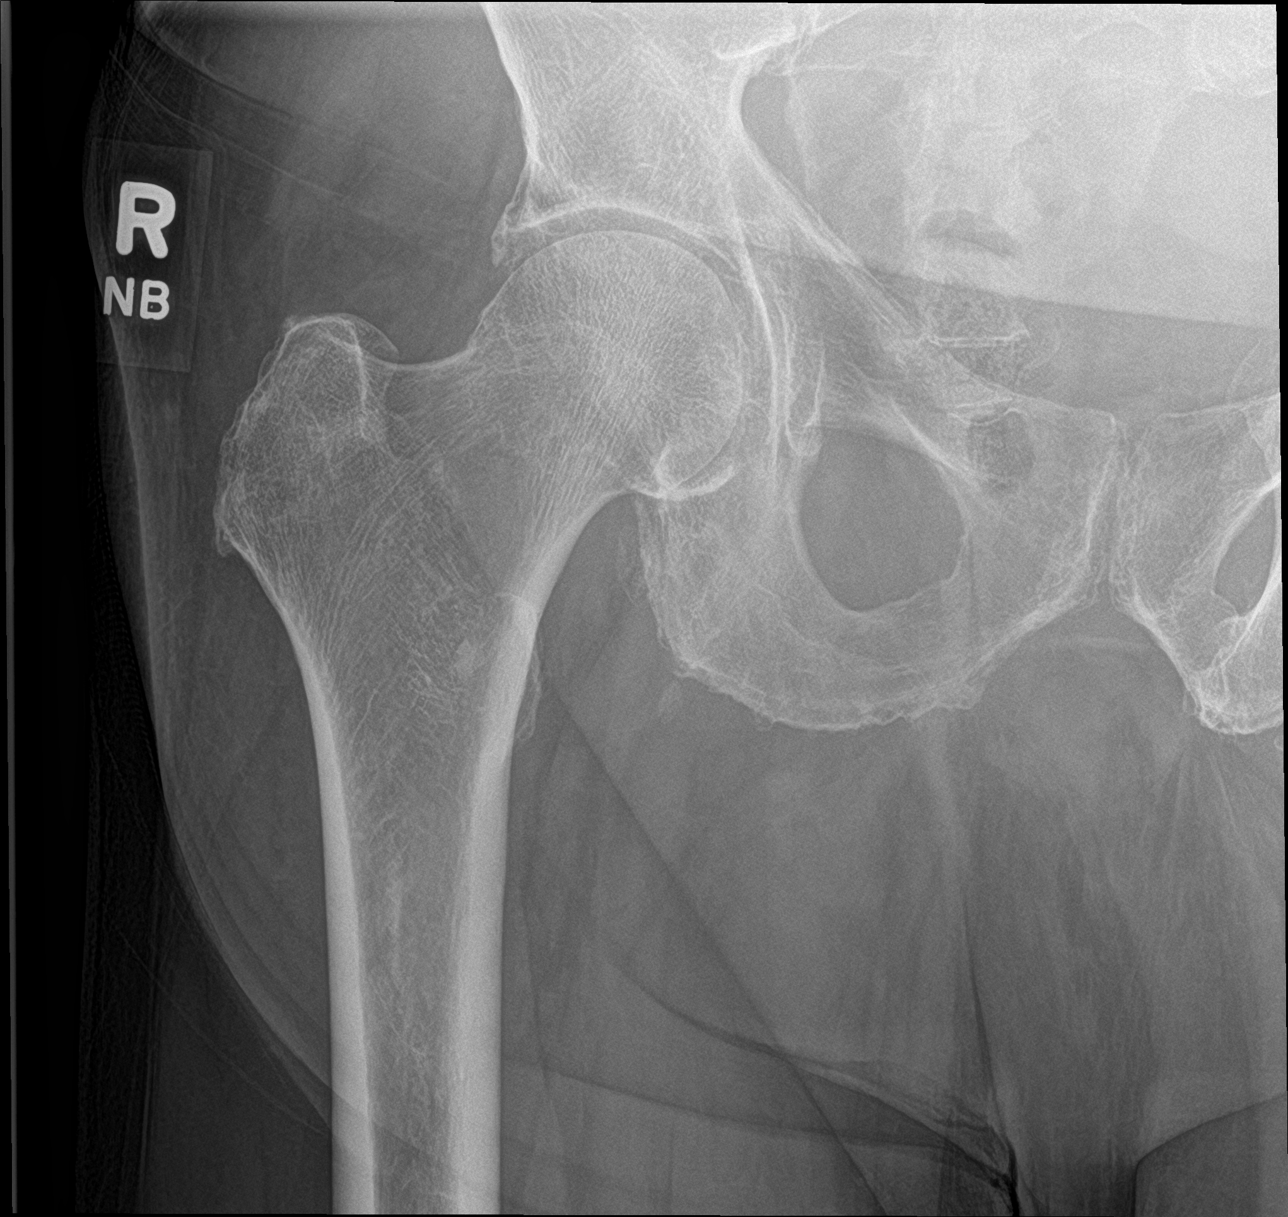

[hip lat]
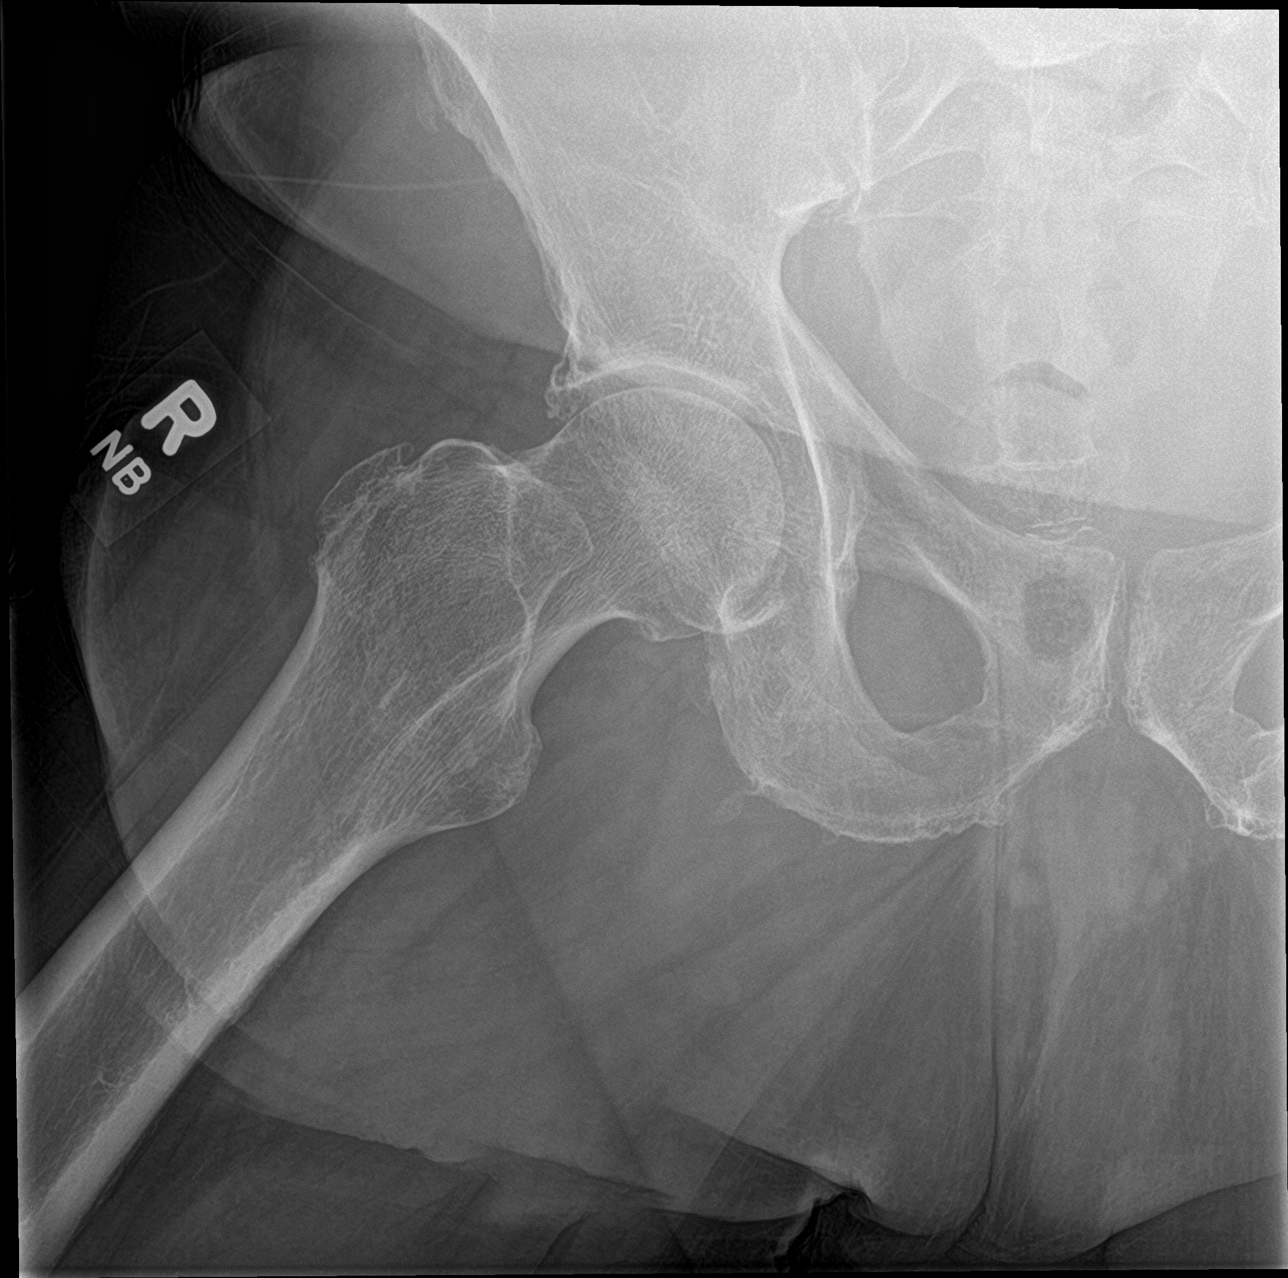

[3 of 3 positions shown; findings below may reference images not displayed]

FINDINGS: There is no evidence of hip fracture or dislocation. Mild osteophyte
formation of right hip is noted without significant joint space
narrowing.
IMPRESSION: Mild degenerative joint disease of the right hip. No acute
abnormality seen.

## 2021-09-02 ENCOUNTER — Telehealth: Payer: Self-pay

## 2021-09-02 NOTE — Telephone Encounter (Signed)
Medication: temazepam (RESTORIL) 30 MG capsule Prior authorization submitted via CoverMyMeds on 09/02/2021 PA submission pending

## 2021-09-08 NOTE — Telephone Encounter (Signed)
Initiated Prior authorization for: temazepam (RESTORIL) 30 MG capsule Via: Covermymeds [id number] Status: Denied as of 09/02/20 Reason:you have requested  more then the maximum quantity  allowed by your plan. You plan covers up to:15 capsules per month of Restoril , your request for additional quanties of the requested drug and strength has been denied Notified Pt via: Mychart/Called pt, pt expressed understanding, pt states he will pay out of pocket  for medication at [facility].

## 2021-09-19 ENCOUNTER — Telehealth (INDEPENDENT_AMBULATORY_CARE_PROVIDER_SITE_OTHER): Payer: Medicare Other | Admitting: Physician Assistant

## 2021-09-19 ENCOUNTER — Encounter: Payer: Self-pay | Admitting: Physician Assistant

## 2021-09-19 DIAGNOSIS — Z8709 Personal history of other diseases of the respiratory system: Secondary | ICD-10-CM

## 2021-09-19 DIAGNOSIS — J0101 Acute recurrent maxillary sinusitis: Secondary | ICD-10-CM

## 2021-09-19 MED ORDER — CEPHALEXIN 500 MG PO CAPS: 500.0000 mg | ORAL_CAPSULE | Freq: Three times a day (TID) | ORAL | 0 refills | Status: AC

## 2021-09-19 NOTE — Progress Notes (Signed)
..  Virtual Visit via Video Note  I connected with Luis Fernandez on 09/19/21 at 11:30 AM EST by a video enabled telemedicine application and verified that I am speaking with the correct person using two identifiers.  Location: Patient: home Provider: clinic  .Marland KitchenParticipating in visit:  Patient: Luis Fernandez Provider: Tandy Gaw PA-C Provider in training: Rolland Porter    I discussed the limitations of evaluation and management by telemedicine and the availability of in person appointments. The patient expressed understanding and agreed to proceed.  History of Present Illness: Pt is a 76 yo male with hx of recurrent sinusitis who calls in to discuss over a week of sinus pressure, pain, achy feeling, nasal sputum, cough. Negative home covid test. No fever, chills. Taking OTC mucinex with little relief. Keflex works the best for him. He worries about infections going to his knee replacement.   .. 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   Insomnia 12/17/2015   B12 deficiency 01/21/2016   Paresthesia 06/23/2016   OSA (obstructive sleep apnea) 06/23/2016   Recurrent sinusitis 01/10/2020   Bilateral hip pain 01/12/2021   Hamstring tendinitis at origin 07/04/2021   OSA on CPAP 08/19/2021   PAD (peripheral artery disease) (HCC) 08/19/2021   COVID-19 08/19/2021   Resolved Ambulatory Problems    Diagnosis Date Noted   Diabetes with ulcer of toe (HCC) 12/17/2015   Steroid-induced adrenal suppression (HCC) 01/21/2016   Trigger finger of left hand 05/26/2017   Obesity, diabetes, and hypertension syndrome (HCC) 01/03/2018   Acute maxillary sinusitis 12/07/2019   GAD (generalized anxiety disorder) 07/11/2020   Acute bacterial sinusitis  04/30/2021   Past Medical History:  Diagnosis Date   Arthritis    Diabetes mellitus without complication (HCC)    Hypertension      Observations/Objective: No acute distress Normal mood and appearance Congested sounding No labored breathing or wheezing  .Marland KitchenThere were no vitals filed for this visit. There is no height or weight on file to calculate BMI.    Assessment and Plan:   Cephalexin has worked well for him in the past for sinusitis.  Discussed symptomatic care with fluids and rest Follow up if symptoms not improving.      Follow Up Instructions:    I discussed the assessment and treatment plan with the patient. The patient was provided an opportunity to ask questions and all were answered. The patient agreed with the plan and demonstrated an understanding of the instructions.   The patient was advised to call back or seek an in-person evaluation if the symptoms worsen or if the condition fails to improve as anticipated.    Tandy Gaw, PA-C

## 2021-10-01 ENCOUNTER — Telehealth: Payer: Self-pay

## 2021-10-01 NOTE — Telephone Encounter (Signed)
°  Initiated Prior authorization DJT:[id number] 1.5MG /0.5ML pen-injectors Via: Covermymeds Case/Key:[id number] Status: Pending as of 10/01/21 Reason: Notified Pt via: Mychart

## 2021-10-11 ENCOUNTER — Other Ambulatory Visit: Payer: Self-pay | Admitting: Family Medicine

## 2021-10-11 DIAGNOSIS — E1142 Type 2 diabetes mellitus with diabetic polyneuropathy: Secondary | ICD-10-CM

## 2021-10-22 ENCOUNTER — Encounter: Payer: Self-pay | Admitting: Family Medicine

## 2021-11-11 ENCOUNTER — Ambulatory Visit: Payer: Self-pay

## 2021-11-11 ENCOUNTER — Ambulatory Visit (INDEPENDENT_AMBULATORY_CARE_PROVIDER_SITE_OTHER): Payer: Medicare Other | Admitting: Family Medicine

## 2021-11-11 ENCOUNTER — Encounter: Payer: Self-pay | Admitting: Family Medicine

## 2021-11-11 VITALS — BP 132/74 | HR 73 | Ht 75.0 in | Wt 231.6 lb

## 2021-11-11 DIAGNOSIS — M25551 Pain in right hip: Secondary | ICD-10-CM | POA: Diagnosis not present

## 2021-11-11 MED ORDER — TRAMADOL HCL 50 MG PO TABS: 50.0000 mg | ORAL_TABLET | Freq: Three times a day (TID) | ORAL | 0 refills | Status: DC | PRN

## 2021-11-11 NOTE — Progress Notes (Signed)
? ?I, Luis Fernandez, LAT, ATC, am serving as scribe for Dr. [name]. ? ?Luis Fernandez is a 76 y.o. male who presents to [facility] today for R hip pain. Pt's last R GT steroid injection was performed on 05/12/21. Pt was last seen by Dr. Denyse Amass on 07/04/21 and was given a R ischial tuberosity steroid injection and was advised to cont HEP. He states that his pain flared up about 2 weeks ago.  He locates his pain to his R lateral hip w/ radiating pain into his R groin.  He states that his HEP is actually hurting his hip more at this point and so has decreased them to every other day. ? ?He notes the injection at the right ischial tuberosity hamstring origin worked very well until recently.  This injection was early December. ? ?Dx imaging: 06/07/21 R hip MRI ?            01/09/21 R & L hip XR ? ?Pertinent review of systems: No fevers or chills ? ?Relevant historical information: Hypertension and diabetes ? ? ?Exam:  ?BP 132/74 (BP Location: Left Arm, Patient Position: Sitting, Cuff Size: Large)   Pulse 73   Ht 6\' 3"  (1.905 m)   Wt 231 lb 9.6 oz (105.1 kg)   SpO2 94%   BMI 28.95 kg/m?  ?General: Well Developed, well nourished, and in no acute distress.  ? ?MSK: Right hip tender palpation at ischial tuberosity.  Normal-appearing otherwise ? ? ? ?Lab and Radiology Results ? ?Procedure: Real-time Ultrasound Guided Injection of right hip ischial tuberosity hamstring origin ?Device: Philips Affiniti 50G ?Images permanently stored and available for review in PACS ?Ultrasound evaluation prior to injection reveals calcification within the hamstring insertion/origin site.  This is consistent in appearance with calcific tendinopathy. ?Verbal informed consent obtained.  Discussed risks and benefits of procedure. Warned about infection, bleeding, hyperglycemia damage to structures among others. ?Patient expresses understanding and agreement ?Time-out conducted.   ?Noted no overlying erythema,  induration, or other signs of local infection.   ?Skin prepped in a sterile fashion.   ?Local anesthesia: Topical Ethyl chloride.   ?With sterile technique and under real time ultrasound guidance: 40 mg of Kenalog and 2 mL of Marcaine injected into right hamstring origin at ischial tuberosity. Fluid seen entering the ischial tuberosity.   ?Completed without difficulty   ?Pain immediately resolved suggesting accurate placement of the medication.   ?Advised to call if fevers/chills, erythema, induration, drainage, or persistent bleeding.   ?Images permanently stored and available for review in the ultrasound unit.  ?Impression: Technically successful ultrasound guided injection. ? ? ? ? ?Assessment and Plan: ?76 y.o. male with Right posterior and lateral hip pain thought to be due mostly due to ischial bursitis and hamstring tendinitis.  Plan for repeat injection today.  Limited refill of his tramadol.  Consider extracorporeal shockwave therapy in the future if needed. ?Recheck as needed. ? ? ?PDMP reviewed during this encounter. ?Orders Placed This Encounter  ?Procedures  ? 73 LIMITED JOINT SPACE STRUCTURES LOW RIGHT(NO LINKED CHARGES)  ?  Order Specific Question:   Reason for Exam (SYMPTOM  OR DIAGNOSIS REQUIRED)  ?  Answer:   R hip pain  ?  Order Specific Question:   Preferred imaging location?  ?  Answer:   [facility]  ? ?Meds ordered this encounter  ?Medications  ? traMADol (ULTRAM) 50 MG tablet  ?  Sig: Take 1 tablet (50 mg total) by mouth  every 8 (eight) hours as needed for severe pain.  ?  Dispense:  15 tablet  ?  Refill:  0  ? ? ? ?Discussed warning signs or symptoms. Please see discharge instructions. Patient expresses understanding. ? ? ?The above documentation has been reviewed and is accurate and complete [name], M.D. ? ? ?

## 2021-11-11 NOTE — Patient Instructions (Addendum)
Good to see you today. ? ?You had a R hip (IT) injection.  Call or go to the ER if you develop a large red swollen joint with extreme pain or oozing puss.  ? ?Follow-up: as needed ?

## 2021-12-02 ENCOUNTER — Other Ambulatory Visit: Payer: Self-pay | Admitting: Family Medicine

## 2021-12-23 ENCOUNTER — Other Ambulatory Visit: Payer: Self-pay | Admitting: Family Medicine

## 2021-12-30 ENCOUNTER — Other Ambulatory Visit: Payer: Self-pay | Admitting: Family Medicine

## 2022-01-19 ENCOUNTER — Encounter: Payer: Self-pay | Admitting: Family Medicine

## 2022-01-19 ENCOUNTER — Ambulatory Visit (INDEPENDENT_AMBULATORY_CARE_PROVIDER_SITE_OTHER): Payer: Medicare Other | Admitting: Family Medicine

## 2022-01-19 VITALS — BP 120/69 | HR 67 | Ht 75.0 in | Wt 229.0 lb

## 2022-01-19 DIAGNOSIS — I152 Hypertension secondary to endocrine disorders: Secondary | ICD-10-CM

## 2022-01-19 DIAGNOSIS — G4700 Insomnia, unspecified: Secondary | ICD-10-CM | POA: Diagnosis not present

## 2022-01-19 DIAGNOSIS — E1159 Type 2 diabetes mellitus with other circulatory complications: Secondary | ICD-10-CM | POA: Diagnosis not present

## 2022-01-19 DIAGNOSIS — M25552 Pain in left hip: Secondary | ICD-10-CM | POA: Diagnosis not present

## 2022-01-19 DIAGNOSIS — M25551 Pain in right hip: Secondary | ICD-10-CM

## 2022-01-19 DIAGNOSIS — E1142 Type 2 diabetes mellitus with diabetic polyneuropathy: Secondary | ICD-10-CM | POA: Diagnosis not present

## 2022-01-19 DIAGNOSIS — T753XXA Motion sickness, initial encounter: Secondary | ICD-10-CM | POA: Diagnosis not present

## 2022-01-19 DIAGNOSIS — E538 Deficiency of other specified B group vitamins: Secondary | ICD-10-CM

## 2022-01-19 DIAGNOSIS — E785 Hyperlipidemia, unspecified: Secondary | ICD-10-CM | POA: Diagnosis not present

## 2022-01-19 DIAGNOSIS — G4733 Obstructive sleep apnea (adult) (pediatric): Secondary | ICD-10-CM | POA: Diagnosis not present

## 2022-01-19 MED ORDER — TEMAZEPAM 30 MG PO CAPS: ORAL_CAPSULE | ORAL | 5 refills | Status: DC

## 2022-01-19 MED ORDER — IBUPROFEN 600 MG PO TABS: 600.0000 mg | ORAL_TABLET | Freq: Three times a day (TID) | ORAL | 3 refills | Status: DC | PRN

## 2022-01-19 MED ORDER — SCOPOLAMINE 1 MG/3DAYS TD PT72
1.0000 | MEDICATED_PATCH | TRANSDERMAL | 0 refills | Status: DC
Start: 2022-01-19 — End: 2022-04-21

## 2022-01-19 NOTE — Assessment & Plan Note (Signed)
BP remains well controlled at this time.  Recommend continuation of current medications for management of HTN.   ?

## 2022-01-19 NOTE — Patient Instructions (Signed)
Continue current medications.  We'll be in touch with lab results.   See me again in 6 months.

## 2022-01-19 NOTE — Assessment & Plan Note (Signed)
Will continue temazepam at current strength.

## 2022-01-19 NOTE — Assessment & Plan Note (Signed)
He is doing well with current medications. Updating a1c today with labs.

## 2022-01-19 NOTE — Progress Notes (Signed)
Luis Fernandez - 76 y.o. male MRN [id number]  Date of birth: 01/02/1946  Subjective No chief complaint on file.   HPI Luis Fernandez is a 76 y.o. male here today for follow up visit.  He reports that he is doing fairly well at this time.   Diabetes is currently managed with metformin, farxiga and trulicity.  He is tolerating these well.  Blood sugars have been pretty well controlled with this.  He denies hypoglycemia.    BP has remained well controlled with losartan.  Tolerating well.  Denies chest pain, shortness of breath, palpitations, headache or vision changes.   He does use lorazepam at times during the day for anxiety and temazepam at bedtime to help with insomnia.  Denies oversedation or dizziness related to dthis.    Going on a cruise and requesting a renewal of scopolamine patches.  Has used these before and they are working well for him.    OSA is managed by Dr. Vickey Huger.  Seeing Dr. Denyse Amass for hip pain.   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   On medical reccords     Past Surgical History:  Procedure Laterality Date   CARPAL TUNNEL RELEASE     CATARACT EXTRACTION     NOSE SURGERY     TOTAL KNEE ARTHROPLASTY     UMBILICAL HERNIA REPAIR      Social History   Socioeconomic History   Marital status: Married    Spouse name: Johnny Bridge   Number of children: 1   Years of education: 19   Highest education level: Master's degree (e.g., MA, MS, MEng, MEd, MSW, MBA)  Occupational History   Occupation: Clinical research associate    Comment: retired  Tobacco Use   Smoking status: Former   Smokeless tobacco: Never  Building services engineer Use: Never used  Substance and Sexual Activity   Alcohol use: Yes     Alcohol/week: 3.0 standard drinks of alcohol    Types: 3 Cans of beer per week    Comment: 2-3 beers a week, occasionally/glass of wine 2 times weekly   Drug use: No   Sexual activity: Yes  Other Topics Concern   Not on file  Social History Narrative   04/26/2021 - lives at home with wife - walks at least a mile daily and gym 3-4 times weekly - walks with dog - wants to travel more and likes to garden             Walks his dog for 1 hour every day. Goes to the gym as well and strength training and aerobic exercise.     Lives with wife   Caffeine, 5 cups of coffee a day   Right handed   Social Determinants of Health   Financial Resource Strain: Low Risk  (09/12/2018)   Overall Financial Resource Strain (CARDIA)    Difficulty of Paying Living Expenses: Not hard at all  Food Insecurity: No Food Insecurity (09/12/2018)   Hunger Vital Sign    Worried About Running Out of Food in the Last Year: Never true    Ran Out of Food in the Last Year: Never true  Transportation Needs: No Transportation Needs (09/12/2018)  PRAPARE - Administrator, Civil Service (Medical): No    Lack of Transportation (Non-Medical): No  Physical Activity: Sufficiently Active (09/12/2018)   Exercise Vital Sign    Days of Exercise per Week: 7 days    Minutes of Exercise per Session: 60 min  Stress: No Stress Concern Present (09/12/2018)   [employer] of Occupational Health - Occupational Stress Questionnaire    Feeling of Stress : Not at all  Social Connections: Somewhat Isolated (09/12/2018)   Social Connection and Isolation Panel [NHANES]    Frequency of Communication with Friends and Family: More than three times a week    Frequency of Social Gatherings with Friends and Family: Once a week    Attends Religious Services: Never    Database administrator or Organizations: No    Attends Engineer, structural: Never    Marital Status: Married    Family History  Problem Relation Age of  Onset   Heart disease Mother    Hypertension Mother    Parkinson's disease Father    Dementia Brother    Hypertension Brother    Depression Brother    COPD Brother    Colon cancer Neg Hx    Stomach cancer Neg Hx    Rectal cancer Neg Hx    Pancreatic cancer Neg Hx    Esophageal cancer Neg Hx     Health Maintenance  Topic Date Due   COVID-19 Vaccine (6 - Moderna series) 06/09/2021   OPHTHALMOLOGY EXAM  08/01/2021   HEMOGLOBIN A1C  01/19/2022   INFLUENZA VACCINE  03/03/2022   FOOT EXAM  07/21/2022   TETANUS/TDAP  05/13/2029   Pneumonia Vaccine 70+ Years old  Completed   Hepatitis C Screening  Completed   Zoster Vaccines- Shingrix  Completed   HPV VACCINES  Aged Out   Fecal DNA (Cologuard)  Discontinued     ----------------------------------------------------------------------------------------------------------------------------------------------------------------------------------------------------------------- Physical Exam There were no vitals taken for this visit.  Physical Exam Constitutional:      Appearance: Normal appearance.  Eyes:     General: No scleral icterus. Cardiovascular:     Rate and Rhythm: Normal rate and regular rhythm.  Pulmonary:     Effort: Pulmonary effort is normal.     Breath sounds: Normal breath sounds.  Musculoskeletal:     Cervical back: Neck supple.  Neurological:     Mental Status: He is alert.  Psychiatric:        Mood and Affect: Mood normal.        Behavior: Behavior normal.    ------------------------------------------------------------------------------------------------------------------------------------------------------------------------------------------------------------------- Assessment and Plan  Hypertension associated with diabetes (HCC) BP remains well controlled at this time.  Recommend continuation of current medications for management of HTN.    OSA (obstructive sleep apnea) Managed by Neurology.  On  CPAP.  Diabetes with neurologic complications [facility]) He is doing well with current medications. Updating a1c today with labs.    Insomnia Will continue temazepam at current strength.   Bilateral hip pain Seeing Dr. Denyse Amass.  Ibuprofen renewed for now.  He is using CBD as well, feels that this works as well or better than tramadol.     Meds ordered this encounter  Medications   temazepam (RESTORIL) 30 MG capsule    Sig: TAKE ONE CAPSULE BY MOUTH EVERY NIGHT AT BEDTIME AS NEEDED FOR SLEEP    Dispense:  30 capsule    Refill:  5   ibuprofen (ADVIL) 600 MG tablet    Sig: Take 1 tablet (  600 mg total) by mouth every 8 (eight) hours as needed.    Dispense:  270 tablet    Refill:  3   scopolamine (TRANSDERM-SCOP) 1 MG/3DAYS    Sig: Place 1 patch (1.5 mg total) onto the skin every 3 (three) days.    Dispense:  4 patch    Refill:  0    Return in about 6 months (around 07/21/2022) for T2DM/HTN.    This visit occurred during the SARS-CoV-2 public health emergency.  Safety protocols were in place, including screening questions prior to the visit, additional usage of staff PPE, and extensive cleaning of exam room while observing appropriate contact time as indicated for disinfecting solutions.

## 2022-01-19 NOTE — Assessment & Plan Note (Signed)
Managed by Neurology.  On CPAP.

## 2022-01-19 NOTE — Assessment & Plan Note (Signed)
Seeing Dr. Denyse Amass.  Ibuprofen renewed for now.  He is using CBD as well, feels that this works as well or better than tramadol.

## 2022-01-19 NOTE — Assessment & Plan Note (Signed)
Going on a cruise and has used transdermal scopolamine.  Rx for this renewed.

## 2022-01-20 LAB — COMPLETE METABOLIC PANEL WITH GFR
AG Ratio: 2.1 (calc) (ref 1.0–2.5)
ALT: 29 U/L (ref 9–46)
AST: 29 U/L (ref 10–35)
Albumin: 4.7 g/dL (ref 3.6–5.1)
Alkaline phosphatase (APISO): 40 U/L (ref 35–144)
BUN: 24 mg/dL (ref 7–25)
CO2: 23 mmol/L (ref 20–32)
Calcium: 10.2 mg/dL (ref 8.6–10.3)
Chloride: 106 mmol/L (ref 98–110)
Creat: 0.9 mg/dL (ref 0.70–1.28)
Globulin: 2.2 g/dL (calc) (ref 1.9–3.7)
Glucose, Bld: 116 mg/dL (ref 65–139)
Potassium: 4.7 mmol/L (ref 3.5–5.3)
Sodium: 141 mmol/L (ref 135–146)
Total Bilirubin: 0.8 mg/dL (ref 0.2–1.2)
Total Protein: 6.9 g/dL (ref 6.1–8.1)
eGFR: 88 mL/min/{1.73_m2} (ref >=60)

## 2022-01-20 LAB — CBC WITH DIFFERENTIAL/PLATELET
Absolute Monocytes: 386 cells/uL (ref 200–950)
Basophils Absolute: 51 cells/uL (ref 0–200)
Basophils Relative: 1.1 %
Eosinophils Absolute: 129 cells/uL (ref 15–500)
Eosinophils Relative: 2.8 %
HCT: 48.9 % (ref 38.5–50.0)
Hemoglobin: 16.7 g/dL (ref 13.2–17.1)
Lymphs Abs: 1366 cells/uL (ref 850–3900)
MCH: 32.8 pg (ref 27.0–33.0)
MCHC: 34.2 g/dL (ref 32.0–36.0)
MCV: 96.1 fL (ref 80.0–100.0)
MPV: 9.2 fL (ref 7.5–12.5)
Monocytes Relative: 8.4 %
Neutro Abs: 2668 cells/uL (ref 1500–7800)
Neutrophils Relative %: 58 %
Platelets: 274 10*3/uL (ref 140–400)
RBC: 5.09 10*6/uL (ref 4.20–5.80)
RDW: 12.6 % (ref 11.0–15.0)
Total Lymphocyte: 29.7 %
WBC: 4.6 10*3/uL (ref 3.8–10.8)

## 2022-01-20 LAB — LIPID PANEL W/REFLEX DIRECT LDL
Cholesterol: 163 mg/dL (ref <200)
HDL: 36 mg/dL — ABNORMAL LOW (ref >=40)
LDL Cholesterol (Calc): 94 mg/dL (calc)
Non-HDL Cholesterol (Calc): 127 mg/dL (calc) (ref <130)
Total CHOL/HDL Ratio: 4.5 (calc) (ref <5.0)
Triglycerides: 248 mg/dL — ABNORMAL HIGH (ref <150)

## 2022-01-20 LAB — HEMOGLOBIN A1C
Hgb A1c MFr Bld: 6.2 % of total Hgb — ABNORMAL HIGH (ref <5.7)
Mean Plasma Glucose: 131 mg/dL
eAG (mmol/L): 7.3 mmol/L

## 2022-01-20 LAB — TSH: TSH: 0.66 mIU/L (ref 0.40–4.50)

## 2022-01-20 LAB — VITAMIN B12: Vitamin B-12: 806 pg/mL (ref 200–1100)

## 2022-02-13 ENCOUNTER — Other Ambulatory Visit: Payer: Self-pay | Admitting: Family Medicine

## 2022-02-16 ENCOUNTER — Ambulatory Visit (INDEPENDENT_AMBULATORY_CARE_PROVIDER_SITE_OTHER): Payer: Medicare Other | Admitting: Family Medicine

## 2022-02-16 ENCOUNTER — Ambulatory Visit: Payer: Self-pay

## 2022-02-16 VITALS — BP 138/76 | HR 82 | Ht 75.0 in | Wt 232.2 lb

## 2022-02-16 DIAGNOSIS — M76891 Other specified enthesopathies of right lower limb, excluding foot: Secondary | ICD-10-CM | POA: Diagnosis not present

## 2022-02-16 DIAGNOSIS — M25551 Pain in right hip: Secondary | ICD-10-CM

## 2022-02-16 DIAGNOSIS — M76899 Other specified enthesopathies of unspecified lower limb, excluding foot: Secondary | ICD-10-CM

## 2022-02-16 MED ORDER — TRAMADOL HCL 50 MG PO TABS: 50.0000 mg | ORAL_TABLET | Freq: Three times a day (TID) | ORAL | 0 refills | Status: DC | PRN

## 2022-02-16 NOTE — Progress Notes (Signed)
I, Luis Fernandez, LAT, ATC, am serving as scribe for Dr. [name].  Luis Fernandez is a 76 y.o. male who presents to [facility] today for continued R hip pain. Pt was last seen by Dr. Denyse Amass on 11/11/21 and had a R ischial tuberosity steroid injection and given a limited refill of tramadol. Prior to that he had a R GT steroid injection on 05/12/21 and another R IT steroid injection on 07/04/21.  Today, pt reports R post hip, lateral hip and R groin pain x 3-4 weeks.  He is leaving for a cruise next Wed.  He is taking IBU and CBD gummies.  Dx imaging: 06/07/21 R hip MRI             01/09/21 R & L hip XR  Pertinent review of systems: No fevers or chills  Relevant historical information: Hypertension and diabetes   Exam:  BP 138/76 (BP Location: Left Arm, Patient Position: Sitting, Cuff Size: Normal)   Pulse 82   Ht 6\' 3"  (1.905 m)   Wt 232 lb 3.2 oz (105.3 kg)   SpO2 94%   BMI 29.02 kg/m  General: Well Developed, well nourished, and in no acute distress.   MSK: Right posterior hip tender palpation at ischial tuberosity.    Lab and Radiology Results  Procedure: Real-time Ultrasound Guided Injection of right hip ischial tuberosity bursa Device: Philips Affiniti 50G Images permanently stored and available for review in PACS Calcific changes seen in the ischial tuberosity hamstring origin. Verbal informed consent obtained.  Discussed risks and benefits of procedure. Warned about infection, bleeding, hyperglycemia damage to structures among others. Patient expresses understanding and agreement Time-out conducted.   Noted no overlying erythema, induration, or other signs of local infection.   Skin prepped in a sterile fashion.   Local anesthesia: Topical Ethyl chloride.   With sterile technique and under real time ultrasound guidance: 40 mg of Kenalog and 2 mL of Marcaine injected into right hip ischial tuberosity bursa. Fluid seen entering the bursa.    Completed without difficulty   Pain immediately resolved suggesting accurate placement of the medication.   Advised to call if fevers/chills, erythema, induration, drainage, or persistent bleeding.   Images permanently stored and available for review in the ultrasound unit.  Impression: Technically successful ultrasound guided injection.        Assessment and Plan: 76 y.o. male with right posterior hip pain thought to be due to ischial tuberosity bursitis and insertional hamstring tendinitis.  He does have some lateral hip and anterior hip pain as well that is thought to be trochanteric bursitis and possibly hip arthritis.  Plan for repeat targeted injection today.  When the pain returns would like to try something different.  He will reschedule for shockwave therapy when the pain returns. Limited refill tramadol today.   PDMP reviewed during this encounter. Orders Placed This Encounter  Procedures   62 LIMITED JOINT SPACE STRUCTURES LOW RIGHT(NO LINKED CHARGES)    Order Specific Question:   Reason for Exam (SYMPTOM  OR DIAGNOSIS REQUIRED)    Answer:   R hip pain    Order Specific Question:   Preferred imaging location?    Answer:   [facility] nurse ordered this encounter  Medications   traMADol (ULTRAM) 50 MG tablet    Sig: Take 1 tablet (50 mg total) by mouth every 8 (eight) hours as needed for severe pain.    Dispense:  15  tablet    Refill:  0     Discussed warning signs or symptoms. Please see discharge instructions. Patient expresses understanding.   The above documentation has been reviewed and is accurate and complete [name], M.D.

## 2022-02-16 NOTE — Patient Instructions (Addendum)
Good to see you today.  You had a R ischial tuberosity injection.  Call or go to the ER if you develop a large red swollen joint with extreme pain or oozing puss.   Follow-up as needed.  When pain starts to return, we'll do shockwave therapy next.

## 2022-02-17 DIAGNOSIS — Z23 Encounter for immunization: Secondary | ICD-10-CM | POA: Diagnosis not present

## 2022-03-12 ENCOUNTER — Other Ambulatory Visit: Payer: Self-pay | Admitting: Family Medicine

## 2022-03-20 ENCOUNTER — Other Ambulatory Visit: Payer: Self-pay | Admitting: Family Medicine

## 2022-03-25 ENCOUNTER — Other Ambulatory Visit: Payer: Self-pay | Admitting: Family Medicine

## 2022-03-26 ENCOUNTER — Other Ambulatory Visit: Payer: Self-pay | Admitting: Family Medicine

## 2022-03-26 ENCOUNTER — Other Ambulatory Visit: Payer: Self-pay

## 2022-03-26 MED ORDER — LORAZEPAM 0.5 MG PO TABS: 0.5000 mg | ORAL_TABLET | Freq: Every day | ORAL | 2 refills | Status: DC

## 2022-03-26 MED ORDER — CEPHALEXIN 500 MG PO CAPS: 2000.0000 mg | ORAL_CAPSULE | Freq: Once | ORAL | 3 refills | Status: AC

## 2022-03-26 NOTE — Telephone Encounter (Signed)
Patient left a vm msg requesting a med refill for lorazepam rx. Patient is going to be having some dental work next week and is requesting an antibiotic rx as well. Rx pended. Please send to [facility] pharmacy.   Last OV: 01/19/22 Next OV: 07/22/22 Last RF: 12/23/21

## 2022-03-26 NOTE — Telephone Encounter (Signed)
Completed.

## 2022-03-27 DIAGNOSIS — Z23 Encounter for immunization: Secondary | ICD-10-CM | POA: Diagnosis not present

## 2022-03-27 NOTE — Telephone Encounter (Signed)
Patient having dental work in the upcoming week.

## 2022-04-06 ENCOUNTER — Other Ambulatory Visit: Payer: Self-pay | Admitting: Family Medicine

## 2022-04-07 ENCOUNTER — Other Ambulatory Visit: Payer: Self-pay | Admitting: Family Medicine

## 2022-04-07 DIAGNOSIS — E1142 Type 2 diabetes mellitus with diabetic polyneuropathy: Secondary | ICD-10-CM

## 2022-04-21 ENCOUNTER — Ambulatory Visit (INDEPENDENT_AMBULATORY_CARE_PROVIDER_SITE_OTHER): Payer: Medicare Other | Admitting: Family Medicine

## 2022-04-21 ENCOUNTER — Encounter: Payer: Self-pay | Admitting: Family Medicine

## 2022-04-21 VITALS — BP 136/81 | HR 73 | Ht 75.0 in | Wt 232.0 lb

## 2022-04-21 DIAGNOSIS — J0111 Acute recurrent frontal sinusitis: Secondary | ICD-10-CM | POA: Diagnosis not present

## 2022-04-21 DIAGNOSIS — E1142 Type 2 diabetes mellitus with diabetic polyneuropathy: Secondary | ICD-10-CM

## 2022-04-21 DIAGNOSIS — J329 Chronic sinusitis, unspecified: Secondary | ICD-10-CM

## 2022-04-21 DIAGNOSIS — E11621 Type 2 diabetes mellitus with foot ulcer: Secondary | ICD-10-CM

## 2022-04-21 DIAGNOSIS — L97519 Non-pressure chronic ulcer of other part of right foot with unspecified severity: Secondary | ICD-10-CM

## 2022-04-21 LAB — POCT GLYCOSYLATED HEMOGLOBIN (HGB A1C): HbA1c, POC (controlled diabetic range): 6.4 % (ref 0.0–7.0)

## 2022-04-21 MED ORDER — TRULICITY 3 MG/0.5ML ~~LOC~~ SOAJ: 3.0000 mg | SUBCUTANEOUS | 1 refills | Status: DC

## 2022-04-21 MED ORDER — CEPHALEXIN 500 MG PO CAPS: 500.0000 mg | ORAL_CAPSULE | Freq: Three times a day (TID) | ORAL | 0 refills | Status: DC

## 2022-04-21 NOTE — Assessment & Plan Note (Signed)
Is responded well to Keflex in the past.  We will add this back on.

## 2022-04-21 NOTE — Progress Notes (Addendum)
Luis Fernandez - 76 y.o. male MRN [id number]  Date of birth: 1946-02-14  Subjective Chief Complaint  Patient presents with   Diabetes   Hypertension    HPI Luis Fernandez is a 76 year old male here today with complaint of blister on his foot.  Reports that he first noticed this a few days ago.  Area is painful.  Blister did pop with clear appearing fluid and some blood.  He did note another small blister adjacent to it as well.  This was popped as well.  He has had pain with walking.  Denies fever or chills.  He does not recall any injury to the foot.  He also thinks he may have a sinus infection.  Has history of sinus infections a few times a year.  Reports that this feels similar to his previous sinus infection.  Currently having maxillary sinus pain with radiating, yellow/green mucus.  ROS:  A comprehensive ROS was completed and negative except as noted per HPI  Allergies  Allergen Reactions   Prednisone     Intolerance     Past Medical History:  Diagnosis Date   Arthritis    Diabetes mellitus without complication ([place])    Hypertension    Steroid-induced adrenal suppression ([place]) 01/21/2016   On medical reccords     Past Surgical History:  Procedure Laterality Date   CARPAL TUNNEL RELEASE     CATARACT EXTRACTION     NOSE SURGERY     TOTAL KNEE ARTHROPLASTY     UMBILICAL HERNIA REPAIR      Social History   Socioeconomic History   Marital status: Married    Spouse name: [name]   Number of children: 1   Years of education: 63   Highest education level: Master's degree (e.g., MA, MS, MEng, MEd, MSW, MBA)  Occupational History   Occupation: Chief Executive Officer    Comment: retired  Tobacco Use   Smoking status: Former   Smokeless tobacco: Never  Scientific laboratory technician Use: Never used  Substance and Sexual Activity   Alcohol use: Yes    Alcohol/week: 3.0 standard drinks of alcohol    Types: 3 Cans of beer per week    Comment: 2-3 beers a week, occasionally/glass of wine 2 times  weekly   Drug use: No   Sexual activity: Yes  Other Topics Concern   Not on file  Social History Narrative   04/26/2021 - lives at home with wife - walks at least a mile daily and gym 3-4 times weekly - walks with dog - wants to travel more and likes to garden             Walks his dog for 1 hour every day. Goes to the gym as well and strength training and aerobic exercise.     Lives with wife   Caffeine, 5 cups of coffee a day   Right handed   Social Determinants of Health   Financial Resource Strain: Low Risk  (09/12/2018)   Overall Financial Resource Strain (CARDIA)    Difficulty of Paying Living Expenses: Not hard at all  Food Insecurity: No Food Insecurity (09/12/2018)   Hunger Vital Sign    Worried About Running Out of Food in the Last Year: Never true    Ran Out of Food in the Last Year: Never true  Transportation Needs: No Transportation Needs (09/12/2018)   PRAPARE - Hydrologist (Medical): No    Lack of Transportation (Non-Medical): No  Physical  Activity: Sufficiently Active (09/12/2018)   Exercise Vital Sign    Days of Exercise per Week: 7 days    Minutes of Exercise per Session: 60 min  Stress: No Stress Concern Present (09/12/2018)   [employer] of Occupational Health - Occupational Stress Questionnaire    Feeling of Stress : Not at all  Social Connections: Somewhat Isolated (09/12/2018)   Social Connection and Isolation Panel [NHANES]    Frequency of Communication with Friends and Family: More than three times a week    Frequency of Social Gatherings with Friends and Family: Once a week    Attends Religious Services: Never    Database administrator or Organizations: No    Attends Engineer, structural: Never    Marital Status: Married    Family History  Problem Relation Age of Onset   Heart disease Mother    Hypertension Mother    Parkinson's disease Father    Dementia Brother    Hypertension Brother    Depression  Brother    COPD Brother    Colon cancer Neg Hx    Stomach cancer Neg Hx    Rectal cancer Neg Hx    Pancreatic cancer Neg Hx    Esophageal cancer Neg Hx     Health Maintenance  Topic Date Due   OPHTHALMOLOGY EXAM  04/21/2022 (Originally 08/01/2021)   COVID-19 Vaccine (6 - Moderna series) 04/21/2022 (Originally 06/09/2021)   Diabetic kidney evaluation - Urine ACR  09/03/2022 (Originally 01/09/2021)   FOOT EXAM  07/21/2022   HEMOGLOBIN A1C  10/20/2022   Diabetic kidney evaluation - GFR measurement  01/20/2023   TETANUS/TDAP  05/13/2029   Pneumonia Vaccine 42+ Years old  Completed   INFLUENZA VACCINE  Completed   Hepatitis C Screening  Completed   Zoster Vaccines- Shingrix  Completed   HPV VACCINES  Aged Out   Fecal DNA (Cologuard)  Discontinued     ----------------------------------------------------------------------------------------------------------------------------------------------------------------------------------------------------------------- Physical Exam BP 136/81 (BP Location: Right Arm, Patient Position: Sitting, Cuff Size: Large)   Pulse 73   Ht 6\' 3"  (1.905 m)   Wt 232 lb (105.2 kg)   SpO2 97%   BMI 29.00 kg/m   Physical Exam Constitutional:      Appearance: Normal appearance.  Eyes:     General: No scleral icterus. Cardiovascular:     Rate and Rhythm: Normal rate and regular rhythm.  Pulmonary:     Effort: Pulmonary effort is normal.     Breath sounds: Normal breath sounds.  Musculoskeletal:     Cervical back: Neck supple.  Skin:    Comments: Small ulceration located on great toe of the right foot. Additionally there is a callused area just proximal and lateral to this ulceration.  This area was debrided and no additional ulceration was noted under this.  Neurological:     General: No focal deficit present.     Mental Status: He is alert.  Psychiatric:        Mood and Affect: Mood normal.        Behavior: Behavior normal.      ------------------------------------------------------------------------------------------------------------------------------------------------------------------------------------------------------------------- Assessment and Plan  Diabetes with neurologic complications (HCC) A1c has increased slightly since last visit.  Increasing Trulicity to 3 mg weekly.  Continue to work on dietary changes.  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  Dispensed postoperative shoe to help with offloading this.  No signs of infection at this time.  Additional callused area that appears to be old  blister was debrided with no additional ulceration noted.  Referral placed to podiatry.  Recurrent sinusitis Is responded well to Keflex in the past.  We will add this back on.   Meds ordered this encounter  Medications   Dulaglutide (TRULICITY) 3 MG/0.5ML SOPN    Sig: Inject 3 mg as directed once a week.    Dispense:  6 mL    Refill:  1   cephALEXin (KEFLEX) 500 MG capsule    Sig: Take 1 capsule (500 mg total) by mouth 3 (three) times daily.    Dispense:  21 capsule    Refill:  0    No follow-ups on file.    This visit occurred during the SARS-CoV-2 public health emergency.  Safety protocols were in place, including screening questions prior to the visit, additional usage of staff PPE, and extensive cleaning of exam room while observing appropriate contact time as indicated for disinfecting solutions.

## 2022-04-21 NOTE — Assessment & Plan Note (Addendum)
Small ulceration of great toe.  Dispensed postoperative shoe to help with offloading this.  No signs of infection at this time.  Additional callused area that appears to be old blister was debrided with no additional ulceration noted.  Referral placed to podiatry.

## 2022-04-21 NOTE — Patient Instructions (Addendum)
Use post-op shoe to help offload toe.   Referral placed to podiatry.  Increase trulicity to 3mg  daily.  Cepahlexin for sinus infection   Medium-size bowl/basin Sterile water or sterile Saline solution Soap (gel) or Betadine or Hibiclens Gauze - some to be kept dry, some to be moistened Vaseline gauze or other non-adherent dressing (Xeroform, etc.) Coban or ACE wrap  Directions Fill a medium-sized bowl/basin with lukewarm water of enough volume to cover the desired body part. Add either 3 pumps of non-fragrance gel soap OR 2 tablespoons of betadine or Hibiclens solution OR Epson salt. Do not use hydrogen peroxide! Place your affected foot in the water bath.  After a 10-15 minute soak, remove your foot  from the water and gently rinse off with sterile water or saline solution. Pat dry with a clean towel. Depending upon your instructions, now place either a non-adherent dressing or a wet-to-dry dressing over the wound. Prepare at wet-to-dry gauze dressing by lightly pouring saline solution onto a clean, dry sponge (2"x2" or 4"x4"). Gently squeeze the excess moisture out of sponge, then place it over the non- adherent dressing.

## 2022-04-21 NOTE — Assessment & Plan Note (Signed)
A1c has increased slightly since last visit.  Increasing Trulicity to 3 mg weekly.  Continue to work on dietary changes.

## 2022-04-29 ENCOUNTER — Ambulatory Visit (INDEPENDENT_AMBULATORY_CARE_PROVIDER_SITE_OTHER): Payer: Medicare Other | Admitting: Family Medicine

## 2022-04-29 DIAGNOSIS — Z0000 Encounter for general adult medical examination without abnormal findings: Secondary | ICD-10-CM

## 2022-04-29 NOTE — Patient Instructions (Addendum)
MEDICARE ANNUAL WELLNESS VISIT Health Maintenance Summary and Written Plan of Care  [name] ,  Thank you for allowing me to perform your Medicare Annual Wellness Visit and for your ongoing commitment to your health.   Health Maintenance & Immunization History Health Maintenance  Topic Date Due  . OPHTHALMOLOGY EXAM  04/29/2022 (Originally 08/01/2021)  . Diabetic kidney evaluation - Urine ACR  09/03/2022 (Originally 01/09/2021)  . COVID-19 Vaccine (7 - Moderna series) 07/02/2022  . FOOT EXAM  07/21/2022  . HEMOGLOBIN A1C  10/20/2022  . Diabetic kidney evaluation - GFR measurement  01/20/2023  . TETANUS/TDAP  05/13/2029  . Pneumonia Vaccine 81+ Years old  Completed  . INFLUENZA VACCINE  Completed  . Hepatitis C Screening  Completed  . Zoster Vaccines- Shingrix  Completed  . HPV VACCINES  Aged Out  . Fecal DNA (Cologuard)  Discontinued   Immunization History  Administered Date(s) Administered  . Fluad Quad(high Dose 65+) 04/19/2019  . Influenza Inj Mdck Quad Pf 03/28/2021  . Influenza, High Dose Seasonal PF 04/05/2018  . Influenza-Unspecified 05/12/2016, 05/12/2017, 04/12/2020, 03/27/2022  . MODERNA COVID-19 SARS-COV-2 PEDS BIVALENT BOOSTER 6Y-11Y 03/02/2022  . Moderna Sars-Covid-2 Vaccination 09/02/2019, 10/02/2019, 06/03/2020, 11/15/2020, 04/14/2021  . Pneumococcal Conjugate-13 06/22/2014  . Pneumococcal Polysaccharide-23 02/20/2010  . Pneumococcal-Unspecified 02/20/2010  . Respiratory Syncytial Virus Vaccine,Recomb Aduvanted(Arexvy) 03/28/2022  . Tdap 03/04/2009, 05/14/2019  . Zoster Recombinat (Shingrix) 11/01/2016, 03/01/2017  . Zoster, Live 01/01/2002    These are the patient goals that we discussed:  Goals Addressed              This Visit's Progress   .  Patient Stated (pt-stated)        Patient stated that he would like to loose 25-30 lbs.        This is a list of Health Maintenance Items that are overdue or due now: Eye exam Foot  exam  Orders/Referrals Placed Today: No orders of the defined types were placed in this encounter.  (Contact our referral department at [phone] if you have not spoken with someone about your referral appointment within the next 5 days)    Follow-up Plan Follow-up with [name], DO as planned Medicare wellness visit in one year. Patient will access AVS on my chart.      Health Maintenance, Male Adopting a healthy lifestyle and getting preventive care are important in promoting health and wellness. Ask your health care provider about: The right schedule for you to have regular tests and exams. Things you can do on your own to prevent diseases and keep yourself healthy. What should I know about diet, weight, and exercise? Eat a healthy diet  Eat a diet that includes plenty of vegetables, fruits, low-fat dairy products, and lean protein. Do not eat a lot of foods that are high in solid fats, added sugars, or sodium. Maintain a healthy weight Body mass index (BMI) is a measurement that can be used to identify possible weight problems. It estimates body fat based on height and weight. Your health care provider can help determine your BMI and help you achieve or maintain a healthy weight. Get regular exercise Get regular exercise. This is one of the most important things you can do for your health. Most adults should: Exercise for at least 150 minutes each week. The exercise should increase your heart rate and make you sweat (moderate-intensity exercise). Do strengthening exercises at least twice a week. This is in addition to the moderate-intensity exercise. Spend less time sitting.  Even light physical activity can be beneficial. Watch cholesterol and blood lipids Have your blood tested for lipids and cholesterol at 77 years of age, then have this test every 5 years. You may need to have your cholesterol levels checked more often if: Your lipid or cholesterol levels are  high. You are older than 77 years of age. You are at high risk for heart disease. What should I know about cancer screening? Many types of cancers can be detected early and may often be prevented. Depending on your health history and family history, you may need to have cancer screening at various ages. This may include screening for: Colorectal cancer. Prostate cancer. Skin cancer. Lung cancer. What should I know about heart disease, diabetes, and high blood pressure? Blood pressure and heart disease High blood pressure causes heart disease and increases the risk of stroke. This is more likely to develop in people who have high blood pressure readings or are overweight. Talk with your health care provider about your target blood pressure readings. Have your blood pressure checked: Every 3-5 years if you are 16-30 years of age. Every year if you are 70 years old or older. If you are between the ages of 20 and 20 and are a current or former smoker, ask your health care provider if you should have a one-time screening for abdominal aortic aneurysm (AAA). Diabetes Have regular diabetes screenings. This checks your fasting blood sugar level. Have the screening done: Once every three years after age 85 if you are at a normal weight and have a low risk for diabetes. More often and at a younger age if you are overweight or have a high risk for diabetes. What should I know about preventing infection? Hepatitis B If you have a higher risk for hepatitis B, you should be screened for this virus. Talk with your health care provider to find out if you are at risk for hepatitis B infection. Hepatitis C Blood testing is recommended for: Everyone born from [birth year] through 1966. Anyone with known risk factors for hepatitis C. Sexually transmitted infections (STIs) You should be screened each year for STIs, including gonorrhea and chlamydia, if: You are sexually active and are younger than 77 years of  age. You are older than 77 years of age and your health care provider tells you that you are at risk for this type of infection. Your sexual activity has changed since you were last screened, and you are at increased risk for chlamydia or gonorrhea. Ask your health care provider if you are at risk. Ask your health care provider about whether you are at high risk for HIV. Your health care provider may recommend a prescription medicine to help prevent HIV infection. If you choose to take medicine to prevent HIV, you should first get tested for HIV. You should then be tested every 3 months for as long as you are taking the medicine. Follow these instructions at home: Alcohol use Do not drink alcohol if your health care provider tells you not to drink. If you drink alcohol: Limit how much you have to 0-2 drinks a day. Know how much alcohol is in your drink. In the [place] one drink equals one 12 oz bottle of beer (355 mL), one 5 oz glass of wine (148 mL), or one 1 oz glass of hard liquor (44 mL). Lifestyle Do not use any products that contain nicotine or tobacco. These products include cigarettes, chewing tobacco, and vaping devices, such as e-cigarettes. If  you need help quitting, ask your health care provider. Do not use street drugs. Do not share needles. Ask your health care provider for help if you need support or information about quitting drugs. General instructions Schedule regular health, dental, and eye exams. Stay current with your vaccines. Tell your health care provider if: You often feel depressed. You have ever been abused or do not feel safe at home. Summary Adopting a healthy lifestyle and getting preventive care are important in promoting health and wellness. Follow your health care provider's instructions about healthy diet, exercising, and getting tested or screened for diseases. Follow your health care provider's instructions on monitoring your cholesterol and blood  pressure. This information is not intended to replace advice given to you by your health care provider. Make sure you discuss any questions you have with your health care provider. Document Revised: 12/09/2020 Document Reviewed: 12/09/2020 Elsevier Patient Education  [place].

## 2022-04-29 NOTE — Progress Notes (Signed)
MEDICARE ANNUAL WELLNESS VISIT  04/29/2022  Telephone Visit Disclaimer This Medicare AWV was conducted by telephone due to national recommendations for restrictions regarding the COVID-19 Pandemic (e.g. social distancing).  I verified, using two identifiers, that I am speaking with Luis Fernandez or their authorized healthcare agent. I discussed the limitations, risks, security, and privacy concerns of performing an evaluation and management service by telephone and the potential availability of an in-person appointment in the future. The patient expressed understanding and agreed to proceed.  Location of Patient: Home Location of Provider (nurse):  In the office.  Subjective:    Luis Fernandez is a 76 y.o. male patient of Luis Coombe, DO who had a Medicare Annual Wellness Visit today via telephone. [name] is Retired and lives with their spouse. he has 1 child. he reports that he is socially active and does interact with friends/family regularly. he is moderately physically active and enjoys reading, traveling and gardening.  Patient Care Team: Luis Coombe, DO as PCP - General (Family Medicine)     04/29/2022   11:03 AM 04/26/2021   11:07 AM 01/23/2021    2:02 PM 09/12/2018   10:56 AM 09/30/2017    9:36 AM 12/17/2015   10:31 AM  Advanced Directives  Does Patient Have a Medical Advance Directive? Yes Yes Yes Yes Yes Yes  Type of Advance Directive Living will Healthcare Power of [place];Living will Healthcare Power of [place];Living will;Out of facility DNR (pink MOST or yellow form) Healthcare Power of [place];Living will Healthcare Power of [place];Living will Healthcare Power of [place];Living will;Out of facility DNR (pink MOST or yellow form)  Does patient want to make changes to medical advance directive? No - Patient declined No - Patient declined No - Patient declined Yes (MAU/Ambulatory/Procedural Areas - Information given)    Copy of Healthcare Power of Attorney  in Chart?  Yes - validated most recent copy scanned in chart (See row information) No - copy requested No - copy requested Yes No - copy requested    Hospital Utilization Over the Past 12 Months: # of hospitalizations or ER visits: 0 # of surgeries: 0  Review of Systems    Patient reports that his overall health is unchanged compared to last year.  History obtained from chart review and the patient  Patient Reported Readings (BP, Pulse, CBG, Weight, etc) none  Pain Assessment Pain : No/denies pain     Current Medications & Allergies (verified) Allergies as of 04/29/2022       Reactions   Prednisone    Intolerance         Medication List        Accurate as of April 29, 2022 11:11 AM. If you have any questions, ask your nurse or doctor.          cephALEXin 500 MG capsule Commonly known as: KEFLEX Take 1 capsule (500 mg total) by mouth 3 (three) times daily.   Farxiga 10 MG Tabs tablet Generic drug: dapagliflozin propanediol TAKE ONE TABLET BY MOUTH DAILY   fenofibrate 145 MG tablet Commonly known as: TRICOR TAKE ONE TABLET BY MOUTH DAILY   FISH OIL PO Take by mouth.   Glucosamine 500 MG Caps Take by mouth.   ibuprofen 600 MG tablet Commonly known as: ADVIL Take 1 tablet (600 mg total) by mouth every 8 (eight) hours as needed.   LORazepam 0.5 MG tablet Commonly known as: ATIVAN Take 1 tablet (0.5 mg total) by mouth daily.   losartan 50 MG tablet  Commonly known as: COZAAR TAKE ONE TABLET BY MOUTH DAILY   MELATONIN PO Take by mouth.   metFORMIN 500 MG 24 hr tablet Commonly known as: GLUCOPHAGE-XR TAKE TWO TABLETS BY MOUTH TWICE A DAY   montelukast 10 MG tablet Commonly known as: SINGULAIR TAKE ONE TABLET BY MOUTH EVERY NIGHT AT BEDTIME   MULTIVITAMIN PO Take by mouth.   OneTouch Delica Plus Lancet33G Misc USE TO CHECK BLOOD SUGAR THREE TIMES A DAY   OneTouch Ultra test strip Generic drug: glucose blood USE ONE STRIP TO TEST THREE  TIMES A DAY   pravastatin 80 MG tablet Commonly known as: PRAVACHOL TAKE ONE TABLET BY MOUTH DAILY   temazepam 30 MG capsule Commonly known as: RESTORIL TAKE ONE CAPSULE BY MOUTH EVERY NIGHT AT BEDTIME AS NEEDED FOR SLEEP   traMADol 50 MG tablet Commonly known as: ULTRAM Take 1 tablet (50 mg total) by mouth every 8 (eight) hours as needed for severe pain.   Trulicity 3 MG/0.5ML Sopn Generic drug: Dulaglutide Inject 3 mg as directed once a week.   VITAMIN B12 PO Take by mouth.        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   JOINT REPLACEMENT  07/09/2008   left  total knee replacement   NOSE SURGERY     TOTAL KNEE ARTHROPLASTY     UMBILICAL HERNIA REPAIR     Family History  Problem Relation Age of Onset   Heart disease Mother    Hypertension Mother    Parkinson's disease Father    Dementia Brother    Hypertension Brother    Depression Brother    COPD Brother    Colon cancer Neg Hx    Stomach cancer Neg Hx    Rectal cancer Neg Hx    Pancreatic cancer Neg Hx    Esophageal cancer Neg Hx    Social History   Socioeconomic History   Marital status: Married    Spouse name: Luis Fernandez   Number of children: 1   Years of education: 19   Highest education level: Doctorate  Occupational History   Occupation: Clinical research associateLawyer    Comment: retired  Tobacco Use   Smoking status: Former    Packs/day: 1.50    Years: 10.00    Total pack years: 15.00    Types: Cigarettes, Pipe    Quit date: 08/17/1976    Years since  quitting: 45.7   Smokeless tobacco: Never  Vaping Use   Vaping Use: Never used  Substance and Sexual Activity   Alcohol use: Yes    Alcohol/week: 1.0 standard drink of alcohol    Types: 1 Cans of beer per week    Comment: 2-3 beers a week, occasionally/glass of wine 2 times weekly   Drug use: Never   Sexual activity: Yes  Other Topics Concern   Not on file  Social History Narrative   Lives with spouse at home. He has  one child. He enjoys reading, travelling and gardening.   Social Determinants of Health   Financial Resource Strain: Low Risk  (04/27/2022)   Overall Financial Resource Strain (CARDIA)    Difficulty of Paying Living Expenses: Not hard at all  Food Insecurity: No Food Insecurity (04/27/2022)   Hunger Vital Sign    Worried About Running Out of Food in the Last Year: Never true    Ran Out of Food in the Last Year: Never true  Transportation Needs: No Transportation Needs (04/27/2022)   PRAPARE - Hydrologist (Medical): No    Lack of Transportation (Non-Medical): No  Physical Activity: Sufficiently Active (04/27/2022)   Exercise Vital Sign    Days of Exercise per Week: 7 days    Minutes of Exercise per Session: 40 min  Stress: No Stress Concern Present (04/27/2022)   [place]    Feeling of Stress : Only a little  Social Connections: Unknown (04/27/2022)   Social Connection and Isolation Panel [NHANES]    Frequency of Communication with Friends and Family: Twice a week    Frequency of Social Gatherings with Friends and Family: Once a week    Attends Religious Services: Not on Diplomatic Services operational officer of Clubs or Organizations: No    Attends Archivist Meetings: 1 to 4 times per year    Marital Status: Married    Activities of Daily Living    04/29/2022   11:03 AM 04/27/2022    3:08 PM  In your present state of health, do you have any difficulty performing the  following activities:  Hearing?  1  Comment bilateral hearing aids.   Vision?  0  Difficulty concentrating or making decisions?  0  Walking or climbing stairs?  0  Dressing or bathing?  0  Doing errands, shopping?  0  Preparing Food and eating ?  N  Using the Toilet?  N  In the past six months, have you accidently leaked urine?  N  Do you have problems with loss of bowel control?  N  Managing your Medications?  N  Managing your Finances?  N  Housekeeping or managing your Housekeeping?  N    Patient Education/ Literacy How often do you need to have someone help you when you read instructions, pamphlets, or other written materials from your doctor or pharmacy?: 1 - Never What is the last grade level you completed in school?: Law school  Exercise Current Exercise Habits: Home exercise routine, Type of exercise: walking, Time (Minutes): 40, Frequency (Times/Week): 7, Weekly Exercise (Minutes/Week): 280, Intensity: Moderate, Exercise limited by: None identified  Diet Patient reports consuming 3 meals a day and 1-2 snack(s) a day Patient reports that his primary diet is: Regular Patient reports that she does have regular access to food.   Depression Screen    04/29/2022   11:02 AM 07/21/2021   10:06 AM 04/26/2021   11:14 AM 07/11/2020   11:01 AM 09/27/2019    1:24 PM 09/12/2018   10:58 AM 01/03/2018    9:15 AM  PHQ 2/9 Scores  PHQ - 2 Score 0 0 0 1 0 0 0  PHQ- 9 Score    2 0       Fall Risk    04/29/2022   11:02 AM 04/27/2022    3:08 PM 07/21/2021   10:06 AM 04/26/2021   11:12 AM 01/09/2021   10:23 AM  Fall  Risk   Falls in the past year? 0 0 0 1 0  Comment    lost balance and fell no injuries   Number falls in past yr: 0  0 0 0  Injury with Fall? 0 0 0  0  Risk for fall due to : No Fall Risks  No Fall Risks Impaired balance/gait Impaired mobility  Follow up Falls evaluation completed  Falls evaluation completed Education provided Falls evaluation completed     Objective:   Kelson Queenan seemed alert and oriented and he participated appropriately during our telephone visit.  Blood Pressure Weight BMI  BP Readings from Last 3 Encounters:  04/21/22 136/81  02/16/22 138/76  01/19/22 120/69   Wt Readings from Last 3 Encounters:  04/21/22 232 lb (105.2 kg)  02/16/22 232 lb 3.2 oz (105.3 kg)  01/19/22 229 lb (103.9 kg)   BMI Readings from Last 1 Encounters:  04/21/22 29.00 kg/m    *Unable to obtain current vital signs, weight, and BMI due to telephone visit type  Hearing/Vision  Izaan did not seem to have difficulty with hearing/understanding during the telephone conversation Reports that he has had a formal eye exam by an eye care professional within the past year Reports that he has not had a formal hearing evaluation within the past year *Unable to fully assess hearing and vision during telephone visit type  Cognitive Function:    04/29/2022   11:06 AM 04/26/2021   11:16 AM 09/12/2018   11:02 AM 12/29/2016    9:50 AM  6CIT Screen  What Year? 0 points 0 points 0 points 0 points  What month? 0 points 0 points 0 points 0 points  What time? 0 points 0 points 0 points 0 points  Count back from 20 0 points 0 points 0 points 0 points  Months in reverse 0 points  0 points 0 points  Repeat phrase 2 points  2 points 4 points  Total Score 2 points  2 points 4 points   (Normal:0-7, Significant for Dysfunction: >8)  Normal Cognitive Function Screening: Yes   Immunization & Health Maintenance Record Immunization History  Administered Date(s) Administered   Fluad Quad(high Dose 65+) 04/19/2019   Influenza Inj Mdck Quad Pf 03/28/2021   Influenza, High Dose Seasonal PF 04/05/2018   Influenza-Unspecified 05/12/2016, 05/12/2017, 04/12/2020, 03/27/2022   MODERNA COVID-19 SARS-COV-2 PEDS BIVALENT BOOSTER 6Y-11Y 03/02/2022   Moderna Sars-Covid-2 Vaccination 09/02/2019, 10/02/2019, 06/03/2020, 11/15/2020, 04/14/2021   Pneumococcal Conjugate-13 06/22/2014    Pneumococcal Polysaccharide-23 02/20/2010   Pneumococcal-Unspecified 02/20/2010   Respiratory Syncytial Virus Vaccine,Recomb Aduvanted(Arexvy) 03/28/2022   Tdap 03/04/2009, 05/14/2019   Zoster Recombinat (Shingrix) 11/01/2016, 03/01/2017   Zoster, Live 01/01/2002    Health Maintenance  Topic Date Due   OPHTHALMOLOGY EXAM  04/29/2022 (Originally 08/01/2021)   Diabetic kidney evaluation - Urine ACR  09/03/2022 (Originally 01/09/2021)   COVID-19 Vaccine (7 - Moderna series) 07/02/2022   FOOT EXAM  07/21/2022   HEMOGLOBIN A1C  10/20/2022   Diabetic kidney evaluation - GFR measurement  01/20/2023   TETANUS/TDAP  05/13/2029   Pneumonia Vaccine 44+ Years old  Completed   INFLUENZA VACCINE  Completed   Hepatitis C Screening  Completed   Zoster Vaccines- Shingrix  Completed   HPV VACCINES  Aged Out   Fecal DNA (Cologuard)  Discontinued       Assessment  This is a routine wellness examination for [employer].  Health Maintenance: Due or Overdue There are no preventive care reminders to display for this  patient.   Luis Fernandez does not need a referral for [employer] Assistance: Care Management:   no Social Work:    no Prescription Assistance:  no Nutrition/Diabetes Education:  no   Plan:  Personalized Goals  Goals Addressed               This Visit's Progress     Patient Stated (pt-stated)        Patient stated that he would like to loose 25-30 lbs.       Personalized Health Maintenance & Screening Recommendations  Eye exam Foot exam  Lung Cancer Screening Recommended: no (Low Dose CT Chest recommended if Age 81-80 years, 30 pack-year currently smoking OR have quit w/in past 15 years) Hepatitis C Screening recommended: no HIV Screening recommended: no  Advanced Directives: Written information was not prepared per patient's request.  Referrals & Orders No orders of the defined types were placed in this encounter.   Follow-up Plan Follow-up with  Luis Coombe, DO as planned Medicare wellness visit in one year. Patient will access AVS on my chart.   I have personally reviewed and noted the following in the patient's chart:   Medical and social history Use of alcohol, tobacco or illicit drugs  Current medications and supplements Functional ability and status Nutritional status Physical activity Advanced directives List of other physicians Hospitalizations, surgeries, and ER visits in previous 12 months Vitals Screenings to include cognitive, depression, and falls Referrals and appointments  In addition, I have reviewed and discussed with Luis Fernandez certain preventive protocols, quality metrics, and best practice recommendations. A written personalized care plan for preventive services as well as general preventive health recommendations is available and can be mailed to the patient at his request.      Modesto Charon, RN BSN  04/29/2022

## 2022-04-30 ENCOUNTER — Ambulatory Visit (INDEPENDENT_AMBULATORY_CARE_PROVIDER_SITE_OTHER): Payer: Medicare Other

## 2022-04-30 ENCOUNTER — Ambulatory Visit (INDEPENDENT_AMBULATORY_CARE_PROVIDER_SITE_OTHER): Payer: Medicare Other | Admitting: Podiatry

## 2022-04-30 DIAGNOSIS — Z872 Personal history of diseases of the skin and subcutaneous tissue: Secondary | ICD-10-CM

## 2022-04-30 DIAGNOSIS — L97511 Non-pressure chronic ulcer of other part of right foot limited to breakdown of skin: Secondary | ICD-10-CM | POA: Diagnosis not present

## 2022-04-30 DIAGNOSIS — E1149 Type 2 diabetes mellitus with other diabetic neurological complication: Secondary | ICD-10-CM | POA: Diagnosis not present

## 2022-04-30 DIAGNOSIS — L84 Corns and callosities: Secondary | ICD-10-CM

## 2022-04-30 NOTE — Progress Notes (Signed)
Subjective:   Patient ID: Luis Fernandez, male   DOB: 76 y.o.   MRN: [id number]   HPI Chief Complaint  Patient presents with   foot exam    Foot exam. Patient had an ulcer to right hallux, No open area noted.      Saw PCP on 04/21/2022 for small ulcer on big toe, postop shoe given. He noticed a bister and an ulcer developed after that. He use iodine soaks and the wound helaed. He is not sure how this started.   Sugar has been a little "goofy" he states.  A1c 6.4 on 04/21/2022  Review of Systems  All other systems reviewed and are negative.  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   JOINT REPLACEMENT  07/09/2008   left  total knee replacement   NOSE SURGERY     TOTAL KNEE ARTHROPLASTY     UMBILICAL HERNIA REPAIR       Current Outpatient Medications:    cephALEXin (KEFLEX) 500 MG capsule, Take 1 capsule (500 mg total) by mouth 3 (three) times daily., Disp: 21 capsule, Rfl: 0   Cyanocobalamin (VITAMIN B12 PO), Take by mouth., Disp: , Rfl:    Dulaglutide (TRULICITY) 3 MC/9.4BS SOPN, Inject 3 mg as directed once a week., Disp: 6 mL, Rfl: 1   FARXIGA 10 MG TABS tablet, TAKE ONE TABLET BY MOUTH DAILY, Disp: 90 tablet, Rfl: 2   fenofibrate (TRICOR) 145 MG tablet, TAKE ONE TABLET BY MOUTH DAILY, Disp: 90 tablet, Rfl: 2   Glucosamine 500 MG CAPS, Take by mouth., Disp: , Rfl:    ibuprofen (ADVIL) 600 MG tablet, Take 1 tablet (600 mg total) by mouth every 8 (eight) hours as needed., Disp: 270 tablet, Rfl: 3   Lancets (ONETOUCH DELICA PLUS  [id number]) MISC, USE TO CHECK BLOOD SUGAR THREE TIMES A DAY, Disp: 300 each, Rfl: PRN   LORazepam (ATIVAN) 0.5 MG tablet, Take 1 tablet (0.5 mg total) by mouth daily., Disp: 30 tablet, Rfl: 2   losartan (COZAAR) 50 MG tablet, TAKE ONE TABLET BY MOUTH DAILY, Disp: 90 tablet, Rfl: 1   MELATONIN PO, Take by mouth., Disp: , Rfl:    metFORMIN (GLUCOPHAGE-XR) 500 MG 24 hr tablet, TAKE TWO TABLETS BY MOUTH TWICE A DAY, Disp: 360 tablet, Rfl: 3   montelukast (SINGULAIR) 10 MG tablet, TAKE ONE TABLET BY MOUTH EVERY NIGHT AT BEDTIME, Disp: 90 tablet, Rfl: 2   Multiple Vitamins-Minerals (MULTIVITAMIN PO), Take by mouth., Disp: , Rfl:    Omega-3 Fatty Acids (FISH OIL PO), Take by mouth., Disp: , Rfl:    ONETOUCH ULTRA test strip, USE ONE STRIP TO TEST THREE TIMES A DAY, Disp: 250 strip, Rfl: 1   pravastatin (PRAVACHOL) 80 MG tablet, TAKE ONE TABLET BY MOUTH DAILY, Disp: 90 tablet, Rfl: 3   temazepam (RESTORIL) 30 MG capsule, TAKE ONE CAPSULE BY MOUTH EVERY NIGHT AT BEDTIME AS NEEDED FOR SLEEP, Disp: 30 capsule, Rfl:  5   traMADol (ULTRAM) 50 MG tablet, Take 1 tablet (50 mg total) by mouth every 8 (eight) hours as needed for severe pain., Disp: 15 tablet, Rfl: 0  Allergies  Allergen Reactions   Prednisone     Intolerance           Objective:  Physical Exam  General: AAO x3, NAD  Dermatological: On the right hallux there is some slight hyperkeratotic tissue with new skin present.  There is no blister formation or open lesion today.  Patient lesion left foot plantarly without any underlying ulceration drainage or signs of infection.  Currently there is no open lesions bilaterally.  Vascular: Dorsalis Pedis artery and Posterior Tibial artery pedal pulses are palpable bilateral with immedate capillary fill time.  There is no pain with calf compression, swelling, warmth, erythema.   Neruologic: Sensation decreased to the forefoot.  Musculoskeletal: No pain on exam  Gait: Unassisted, Nonantalgic.        Assessment:   Resolved ulceration right foot with hyperkeratotic lesion     Plan:  -Treatment options discussed including all alternatives, risks, and complications -Etiology of symptoms were discussed -At this time patient wound is healed.  Debride the callus on the left without any occasions or bleeding.  Recommend moisturizer and offloading.  Discussed glucose control as well. -Recommend follow-up every 6 to 12 months.  Encouraged to call any questions or concerns or changes in the meantime.  Vivi Barrack DPM

## 2022-04-30 NOTE — Patient Instructions (Signed)
Diabetes Mellitus and Foot Care Foot care is an important part of your health, especially when you have diabetes. Diabetes may cause you to have problems because of poor blood flow (circulation) to your feet and legs, which can cause your skin to: Become thinner and drier. Break more easily. Heal more slowly. Peel and crack. You may also have nerve damage (neuropathy) in your legs and feet, causing decreased feeling in them. This means that you may not notice minor injuries to your feet that could lead to more serious problems. Noticing and addressing any potential problems early is the best way to prevent future foot problems. How to care for your feet Foot hygiene  Wash your feet daily with warm water and mild soap. Do not use hot water. Then, pat your feet and the areas between your toes until they are completely dry. Do not soak your feet as this can dry your skin. Trim your toenails straight across. Do not dig under them or around the cuticle. File the edges of your nails with an emery board or nail file. Apply a moisturizing lotion or petroleum jelly to the skin on your feet and to dry, brittle toenails. Use lotion that does not contain alcohol and is unscented. Do not apply lotion between your toes. Shoes and socks Wear clean socks or stockings every day. Make sure they are not too tight. Do not wear knee-high stockings since they may decrease blood flow to your legs. Wear shoes that fit properly and have enough cushioning. Always look in your shoes before you put them on to be sure there are no objects inside. To break in new shoes, wear them for just a few hours a day. This prevents injuries on your feet. Wounds, scrapes, corns, and calluses  Check your feet daily for blisters, cuts, bruises, sores, and redness. If you cannot see the bottom of your feet, use a mirror or ask someone for help. Do not cut corns or calluses or try to remove them with medicine. If you find a minor scrape,  cut, or break in the skin on your feet, keep it and the skin around it clean and dry. You may clean these areas with mild soap and water. Do not clean the area with peroxide, alcohol, or iodine. If you have a wound, scrape, corn, or callus on your foot, look at it several times a day to make sure it is healing and not infected. Check for: Redness, swelling, or pain. Fluid or blood. Warmth. Pus or a bad smell. General tips Do not cross your legs. This may decrease blood flow to your feet. Do not use heating pads or hot water bottles on your feet. They may burn your skin. If you have lost feeling in your feet or legs, you may not know this is happening until it is too late. Protect your feet from hot and cold by wearing shoes, such as at the beach or on hot pavement. Schedule a complete foot exam at least once a year (annually) or more often if you have foot problems. Report any cuts, sores, or bruises to your health care provider immediately. Where to find more information American Diabetes Association: www.diabetes.org Association of Diabetes Care & Education Specialists: www.diabeteseducator.org Contact a health care provider if: You have a medical condition that increases your risk of infection and you have any cuts, sores, or bruises on your feet. You have an injury that is not healing. You have redness on your legs or feet. You   feel burning or tingling in your legs or feet. You have pain or cramps in your legs and feet. Your legs or feet are numb. Your feet always feel cold. You have pain around any toenails. Get help right away if: You have a wound, scrape, corn, or callus on your foot and: You have pain, swelling, or redness that gets worse. You have fluid or blood coming from the wound, scrape, corn, or callus. Your wound, scrape, corn, or callus feels warm to the touch. You have pus or a bad smell coming from the wound, scrape, corn, or callus. You have a fever. You have a red  line going up your leg. Summary Check your feet every day for blisters, cuts, bruises, sores, and redness. Apply a moisturizing lotion or petroleum jelly to the skin on your feet and to dry, brittle toenails. Wear shoes that fit properly and have enough cushioning. If you have foot problems, report any cuts, sores, or bruises to your health care provider immediately. Schedule a complete foot exam at least once a year (annually) or more often if you have foot problems. This information is not intended to replace advice given to you by your health care provider. Make sure you discuss any questions you have with your health care provider. Document Revised: 02/08/2020 Document Reviewed: 02/08/2020 Elsevier Patient Education  2023 Elsevier Inc.  

## 2022-05-18 DIAGNOSIS — Z23 Encounter for immunization: Secondary | ICD-10-CM | POA: Diagnosis not present

## 2022-05-19 DIAGNOSIS — E119 Type 2 diabetes mellitus without complications: Secondary | ICD-10-CM | POA: Diagnosis not present

## 2022-05-19 DIAGNOSIS — H5212 Myopia, left eye: Secondary | ICD-10-CM | POA: Diagnosis not present

## 2022-05-19 DIAGNOSIS — Z961 Presence of intraocular lens: Secondary | ICD-10-CM | POA: Diagnosis not present

## 2022-05-19 DIAGNOSIS — H52223 Regular astigmatism, bilateral: Secondary | ICD-10-CM | POA: Diagnosis not present

## 2022-05-19 DIAGNOSIS — Z9849 Cataract extraction status, unspecified eye: Secondary | ICD-10-CM | POA: Diagnosis not present

## 2022-05-19 DIAGNOSIS — H5201 Hypermetropia, right eye: Secondary | ICD-10-CM | POA: Diagnosis not present

## 2022-05-19 DIAGNOSIS — H524 Presbyopia: Secondary | ICD-10-CM | POA: Diagnosis not present

## 2022-05-19 DIAGNOSIS — H53143 Visual discomfort, bilateral: Secondary | ICD-10-CM | POA: Diagnosis not present

## 2022-05-19 LAB — HM DIABETES EYE EXAM

## 2022-06-24 ENCOUNTER — Other Ambulatory Visit: Payer: Self-pay | Admitting: Family Medicine

## 2022-06-27 ENCOUNTER — Other Ambulatory Visit: Payer: Self-pay | Admitting: Family Medicine

## 2022-06-27 DIAGNOSIS — E785 Hyperlipidemia, unspecified: Secondary | ICD-10-CM

## 2022-06-29 ENCOUNTER — Other Ambulatory Visit: Payer: Self-pay

## 2022-07-03 ENCOUNTER — Other Ambulatory Visit: Payer: Self-pay | Admitting: Family Medicine

## 2022-07-08 ENCOUNTER — Encounter: Payer: Self-pay | Admitting: Family Medicine

## 2022-07-14 ENCOUNTER — Encounter: Payer: Self-pay | Admitting: Neurology

## 2022-07-15 ENCOUNTER — Other Ambulatory Visit: Payer: Self-pay | Admitting: Family Medicine

## 2022-07-22 ENCOUNTER — Encounter: Payer: Self-pay | Admitting: Family Medicine

## 2022-07-22 ENCOUNTER — Ambulatory Visit (INDEPENDENT_AMBULATORY_CARE_PROVIDER_SITE_OTHER): Payer: Medicare Other | Admitting: Family Medicine

## 2022-07-22 VITALS — BP 139/73 | HR 73 | Ht 75.0 in | Wt 233.0 lb

## 2022-07-22 DIAGNOSIS — I152 Hypertension secondary to endocrine disorders: Secondary | ICD-10-CM

## 2022-07-22 DIAGNOSIS — J0111 Acute recurrent frontal sinusitis: Secondary | ICD-10-CM

## 2022-07-22 DIAGNOSIS — E1142 Type 2 diabetes mellitus with diabetic polyneuropathy: Secondary | ICD-10-CM

## 2022-07-22 DIAGNOSIS — E1159 Type 2 diabetes mellitus with other circulatory complications: Secondary | ICD-10-CM

## 2022-07-22 DIAGNOSIS — G4700 Insomnia, unspecified: Secondary | ICD-10-CM

## 2022-07-22 LAB — POCT GLYCOSYLATED HEMOGLOBIN (HGB A1C): HbA1c, POC (controlled diabetic range): 6.5 % (ref 0.0–7.0)

## 2022-07-22 LAB — POCT UA - MICROALBUMIN
Albumin/Creatinine Ratio, Urine, POC: <30
Creatinine, POC: 100 mg/dL
Microalbumin Ur, POC: 10 mg/L

## 2022-07-22 MED ORDER — CEPHALEXIN 500 MG PO CAPS: 500.0000 mg | ORAL_CAPSULE | Freq: Three times a day (TID) | ORAL | 1 refills | Status: DC

## 2022-07-22 NOTE — Patient Instructions (Addendum)
Tana Conch, MD or Jacquiline Doe, MD at [facility]

## 2022-07-22 NOTE — Assessment & Plan Note (Signed)
A1c is up slightly.  Recommend continuation of current medications and working on dietary change.

## 2022-07-22 NOTE — Assessment & Plan Note (Signed)
Blood pressure well controlled at this time.  Recommend continuation of losartan at current strength. ?

## 2022-07-22 NOTE — Progress Notes (Signed)
Luis Fernandez - 76 y.o. male MRN [id number]  Date of birth: 16-Aug-1945  Subjective Chief Complaint  Patient presents with   Diabetes   Hypertension    HPI Luis Fernandez is a 76 y.o. male here today for follow up visit.   Reports that he is doing well.   Continues on trulicity, farxiga and metformin for management of diabetes.  He is doing well with current medications and denies side effects.  Previously had ulceration of toe.  Seen by podiatry and this has resolved.   Continues on losartan for management of HTN.  Tolerating well without side effects.  Denies chest pain, shortness of breath, palpitations, headaches or vision changes.   He is taking lorazepam for muscle spasms in his legs as needed.  Muscle relaxers were not effective for him previously.   Using temazepam temazepam at bedtime.  This is working well for him.  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   JOINT REPLACEMENT  07/09/2008   left  total knee replacement   NOSE SURGERY     TOTAL KNEE ARTHROPLASTY     UMBILICAL HERNIA REPAIR      Social History   Socioeconomic History   Marital status: Married    Spouse name: [name]   Number of children: 1   Years of education: 19   Highest education level: Doctorate  Occupational History   Occupation: Chief Executive Officer    Comment: retired  Tobacco Use   Smoking  status: Former    Packs/day: 1.50    Years: 10.00    Total pack years: 15.00    Types: Cigarettes, Pipe    Quit date: 08/17/1976    Years since quitting: 45.9   Smokeless tobacco: Never  Vaping Use   Vaping Use: Never used  Substance and Sexual Activity   Alcohol use: Yes    Alcohol/week: 1.0 standard drink of alcohol    Types: 1 Cans of beer per week    Comment: 2-3 beers a week, occasionally/glass of wine 2 times weekly   Drug use: Never   Sexual activity: Yes  Other Topics Concern   Not on file  Social History Narrative   Lives with spouse at home. He has one child. He enjoys reading, travelling and gardening.   Social Determinants of Health   Financial Resource Strain: Low Risk  (04/27/2022)   Overall Financial Resource Strain (CARDIA)    Difficulty of Paying Living Expenses: Not hard at all  Food Insecurity: No Food Insecurity (04/27/2022)   Hunger Vital Sign    Worried About Running Out of Food in the Last Year: Never true    Ran Out of Food in the Last Year: Never true  Transportation Needs: No Transportation Needs (04/27/2022)  PRAPARE - Hydrologist (Medical): No    Lack of Transportation (Non-Medical): No  Physical Activity: Sufficiently Active (04/27/2022)   Exercise Vital Sign    Days of Exercise per Week: 7 days    Minutes of Exercise per Session: 40 min  Stress: No Stress Concern Present (04/27/2022)   [place]    Feeling of Stress : Only a little  Social Connections: Unknown (04/27/2022)   Social Connection and Isolation Panel [NHANES]    Frequency of Communication with Friends and Family: Twice a week    Frequency of Social Gatherings with Friends and Family: Once a week    Attends Religious Services: Not on Advertising copywriter or Organizations: No    Attends Archivist Meetings: 1 to 4 times per year    Marital Status: Married    Family  History  Problem Relation Age of Onset   Heart disease Mother    Hypertension Mother    Parkinson's disease Father    Dementia Brother    Hypertension Brother    Depression Brother    COPD Brother    Colon cancer Neg Hx    Stomach cancer Neg Hx    Rectal cancer Neg Hx    Pancreatic cancer Neg Hx    Esophageal cancer Neg Hx     Health Maintenance  Topic Date Due   Diabetic kidney evaluation - eGFR measurement  01/20/2023   HEMOGLOBIN A1C  01/21/2023   Medicare Annual Wellness (AWV)  04/30/2023   FOOT EXAM  05/01/2023   OPHTHALMOLOGY EXAM  05/20/2023   Diabetic kidney evaluation - Urine ACR  07/23/2023   DTaP/Tdap/Td (3 - Td or Tdap) 05/13/2029   Pneumonia Vaccine 79+ Years old  Completed   INFLUENZA VACCINE  Completed   COVID-19 Vaccine  Completed   Hepatitis C Screening  Completed   Zoster Vaccines- Shingrix  Completed   HPV VACCINES  Aged Out   Fecal DNA (Cologuard)  Discontinued     ----------------------------------------------------------------------------------------------------------------------------------------------------------------------------------------------------------------- Physical Exam BP 139/73 (BP Location: Left Arm, Patient Position: Sitting, Cuff Size: Normal)   Pulse 73   Ht 6' 3" (1.905 m)   Wt 233 lb (105.7 kg)   SpO2 96%   BMI 29.12 kg/m   Physical Exam Constitutional:      Appearance: Normal appearance.  HENT:     Head: Normocephalic and atraumatic.  Eyes:     General: No scleral icterus. Cardiovascular:     Rate and Rhythm: Normal rate and regular rhythm.  Pulmonary:     Effort: Pulmonary effort is normal.     Breath sounds: Normal breath sounds.  Musculoskeletal:     Cervical back: Neck supple.  Neurological:     Mental Status: He is alert.  Psychiatric:        Mood and Affect: Mood normal.        Behavior: Behavior normal.      ------------------------------------------------------------------------------------------------------------------------------------------------------------------------------------------------------------------- Assessment and Plan  Hypertension associated with diabetes ([place]) Blood pressure well-controlled at this time.  Recommend continuation of losartan at current strength.  Diabetes with neurologic complications ([facility]) W4Y is up slightly.  Recommend continuation of current medications and working on dietary change.  Insomnia Doing well at today's appointment current strength.  He has not noted any significant side effects with this.  Will plan to continue.   Meds ordered this encounter  Medications   cephALEXin (KEFLEX) 500 MG  capsule    Sig: Take 1 capsule (500 mg total) by mouth 3 (three) times daily. Before dental work    Dispense:  21 capsule    Refill:  1    No follow-ups on file.    This visit occurred during the SARS-CoV-2 public health emergency.  Safety protocols were in place, including screening questions prior to the visit, additional usage of staff PPE, and extensive cleaning of exam room while observing appropriate contact time as indicated for disinfecting solutions.

## 2022-07-22 NOTE — Assessment & Plan Note (Signed)
Doing well at today's appointment current strength.  He has not noted any significant side effects with this.  Will plan to continue.

## 2022-08-19 ENCOUNTER — Ambulatory Visit: Payer: Medicare Other | Admitting: Neurology

## 2022-08-27 ENCOUNTER — Encounter: Payer: Self-pay | Admitting: Neurology

## 2022-08-27 ENCOUNTER — Ambulatory Visit (INDEPENDENT_AMBULATORY_CARE_PROVIDER_SITE_OTHER): Payer: Medicare Other | Admitting: Neurology

## 2022-08-27 VITALS — BP 129/79 | HR 81 | Ht 75.0 in | Wt 225.0 lb

## 2022-08-27 DIAGNOSIS — G4733 Obstructive sleep apnea (adult) (pediatric): Secondary | ICD-10-CM

## 2022-08-27 DIAGNOSIS — I152 Hypertension secondary to endocrine disorders: Secondary | ICD-10-CM | POA: Diagnosis not present

## 2022-08-27 DIAGNOSIS — E1159 Type 2 diabetes mellitus with other circulatory complications: Secondary | ICD-10-CM

## 2022-08-27 DIAGNOSIS — I739 Peripheral vascular disease, unspecified: Secondary | ICD-10-CM

## 2022-08-27 NOTE — Patient Instructions (Signed)
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  She has recovered since then.  He has remained a compliant CPAP user.   His Epworth sleepiness score is endorsed at 3 out of 24 points so there is no reason to consult of concern of hypersomnia.  His fatigue score is 17 out of 63 which is actually a rare low score in the patient in his late 26s.  He also endorsed the geriatric depression score today at 3 out of 15 points which is not indicative of clinical depression.     His compliance is excellent: The patient uses the machine 30 out of 30 days each of these days over 7 hours consecutively the minimum pressure is 6 the maximum pressure 14 cm water was 2 cm EPR and his residual AHI is 2.8/h there are a few centrals most of sleep remaining apneas are obstructive he does have a high air leak and his 95th percentile pressure is 12.7 cm water.  I would be happy to increase the maximum pressure by 1 cm but it does not need to be done.  Living With Sleep Apnea Sleep apnea is a condition in which breathing pauses or becomes shallow during sleep. Sleep apnea is most commonly caused by a collapsed or blocked airway. People with sleep apnea usually snore loudly. They may have times when they gasp and stop breathing for 10 seconds or more during sleep. This may happen many times during the night. The breaks in breathing also interrupt the deep sleep that you need to feel rested. Even if you do not completely wake up from the gaps in breathing, your sleep may not be restful and you feel tired during the day. You may also have a headache in the morning and low energy during the day, and you may feel anxious or depressed. How can sleep apnea affect me? Sleep apnea increases your chances of extreme tiredness during the day (daytime fatigue). It can also  increase your risk for health conditions, such as: Heart attack. Stroke. Obesity. Type 2 diabetes. Heart failure. Irregular heartbeat. High blood pressure. If you have daytime fatigue as a result of sleep apnea, you may be more likely to: Perform poorly at school or work. Fall asleep while driving. Have difficulty with attention. Develop depression or anxiety. Have sexual dysfunction. What actions can I take to manage sleep apnea? Sleep apnea treatment  If you were given a device to open your airway while you sleep, use it only as told by your health care provider. You may be given: An oral appliance. This is a custom-made mouthpiece that shifts your lower jaw forward. A continuous positive airway pressure (CPAP) device. This device blows air through a mask when you breathe out (exhale). A nasal expiratory positive airway pressure (EPAP) device. This device has valves that you put into each nostril. A bi-level positive airway pressure (BIPAP) device. This device blows air through a mask when you breathe in (inhale) and breathe out (exhale). You may need surgery if other treatments do not work for you. Sleep habits Go to sleep and wake up at the same time every day. This helps set your internal clock (circadian rhythm) for sleeping. If you stay up later than usual, such as on weekends, try to get up in the morning within 2 hours of  your normal wake time. Try to get at least 7-9 hours of sleep each night. Stop using a computer, tablet, and mobile phone a few hours before bedtime. Do not take long naps during the day. If you nap, limit it to 30 minutes. Have a relaxing bedtime routine. Reading or listening to music may relax you and help you sleep. Use your bedroom only for sleep. Keep your television and computer out of your bedroom. Keep your bedroom cool, dark, and quiet. Use a supportive mattress and pillows. Follow your health care provider's instructions for other changes to  sleep habits. Nutrition Do not eat heavy meals in the evening. Do not have caffeine in the later part of the day. The effects of caffeine can last for more than 5 hours. Follow your health care provider's or dietitian's instructions for any diet changes. Lifestyle     Do not drink alcohol before bedtime. Alcohol can cause you to fall asleep at first, but then it can cause you to wake up in the middle of the night and have trouble getting back to sleep. Do not use any products that contain nicotine or tobacco. These products include cigarettes, chewing tobacco, and vaping devices, such as e-cigarettes. If you need help quitting, ask your health care provider. Medicines Take over-the-counter and prescription medicines only as told by your health care provider. Do not use over-the-counter sleep medicine. You can become dependent on this medicine, and it can make sleep apnea worse. Do not use medicines, such as sedatives and narcotics, unless told by your health care provider. Activity Exercise on most days, but avoid exercising in the evening. Exercising near bedtime can interfere with sleeping. If possible, spend time outside every day. Natural light helps regulate your circadian rhythm. General information Lose weight if you need to, and maintain a healthy weight. Keep all follow-up visits. This is important. If you are having surgery, make sure to tell your health care provider that you have sleep apnea. You may need to bring your device with you. Where to find more information Learn more about sleep apnea and daytime fatigue from: American Sleep Association: sleepassociation.[place]: sleepfoundation.org National Heart, Lung, and Blood Institute: https://www.hartman-hill.biz/ Summary Sleep apnea is a condition in which breathing pauses or becomes shallow during sleep. Sleep apnea can cause daytime fatigue and other serious health conditions. You may need to wear a device while  sleeping to help keep your airway open. If you are having surgery, make sure to tell your health care provider that you have sleep apnea. You may need to bring your device with you. Making changes to sleep habits, diet, lifestyle, and activity can help you manage sleep apnea. This information is not intended to replace advice given to you by your health care provider. Make sure you discuss any questions you have with your health care provider. Document Revised: 02/26/2021 Document Reviewed: 06/28/2020 Elsevier Patient Education  [place].

## 2022-08-27 NOTE — Progress Notes (Signed)
SLEEP MEDICINE CLINIC    Provider:  Larey Seat, MD  Primary Care Physician:  Luetta Nutting, [place] [address]     Referring Provider: Luetta Nutting, [address] [place],  [place] [address]          Chief Complaint according to patient   Patient presents with:     New Patient (Initial Visit)     Pt alone, rm 2. Here for cpap follow up. He has noticed that there will water in tube. Otherwise going well. DME. [facility].       HISTORY OF PRESENT ILLNESS:  Luis Fernandez is a 77 y.o. 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  She has recovered since then.  He has remained a compliant CPAP user.   His Epworth sleepiness score is endorsed at 3 out of 24 points so there is no reason to consult of concern of hypersomnia.  His fatigue score is 17 out of 63 which is actually a rare low score in the patient in his late 80s.  He also endorsed the geriatric depression score today at 3 out of 15 points which is not indicative of clinical depression.    His compliance is excellent: The patient uses the machine 30 out of 30 days each of these days over 7 hours consecutively the minimum pressure is 6 the maximum pressure 14 cm water was 2 cm EPR and his residual AHI is 2.8/h there are a few centrals most of sleep remaining apneas are obstructive he does have a high air leak and his 95th percentile pressure is 12.7 cm water.  I would be happy to increase the maximum pressure by 1 cm but it does not need to be done.  08-19-2021:  Luis Fernandez underwent testing by home sleep test device on 01 May 2021 his apnea was confirmed it was actually  still severe with an AHI of 47.8.  It was a little stronger during non-REM sleep than during REM sleep.  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  The patient had only a moderate volume of snoring 41 dB and snoring accompanied only about 20% of total access the situation.  The 95th percentile has remained at 11.5 cmH2O very close to the previous settings.  His air leak at the 95th percentile is high 36.7 L/min.  The patient does have facial hair.  4 minutes were spent in oxygen desaturation which is clinically not significant.  Heart rate varied between 51 and 90 bpm, he was a previous user of 11 cm water pressure CPAP was to his pressure CPAP was 2 cm EPR I ordered for him to receive a new machine with an autotitration range between 6 and 14 cmH2O pressure also was 2 cm water expiratory pressure relief, heated humidification and a mask of the patient's choice. He chose a nasal pillow.  He reports he is completely using up all water from the small water chamber of the Air Sense 11.   The patient left for [place] between the 14th and 27 October unfortunately  even as a fully vaccinated patient he contracted COVID there.  He has been able to use the new CPAP compliantly ever since, is using it on average 8 hours and 9 minutes a day 100% compliant by days and hours.  Settings as quoted above residual AHI is 4.5.  There is an unknown number of apneas 0.9/h that he have to deduct from his AHI these are events that occur when there is a high air leak and the machine cannot   Originally referred on 03-26-2021 from new PCP. Chief concern according to patient:  " I was last tested in 2005, my only sleep stud yand my CPAP has been replaced in 2015 , and is still working. I am using a nasal gel comfort mask. I also have a travel machine. "   I have the pleasure of seeing Luis Fernandez 03-26-2021, a right-handed Caucasian male with a longstanding diagnosis of  OSA sleep disorder.  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   The patient had the first sleep study in the year 2005, the last and only sleep study was done at [facility] center.  the results have been shared with Dr. Cyril Mourning, the ordering physician was Dr. Marjean Donna. Stewart at the time date of the study (05-29-2004 )the patient had an RDI of 16.2 /h, which is a mild degree of apnea,  and his oxygen nadir was 84% saturation.  He received a diagnosis of obstructive sleep apnea . he did have frequent limb movements but none of them were periodic or leading to arousals.   He had about 16.8% REM sleep no deep sleep during the sleep study.  Latency to REM sleep was 96 minutes.   Sleep was not extremely fragmented.  There were no central apneas.   The patient was titrated in another sleep study to a CPAP at a pressure of 9.5 cmH2O this is where his machine today still is set.  The patient's compliance data for today's use of CPAP 100% compliance by days and hours with an average of 7 hours and 38 minutes per night.  The machine right now is set at 11 cm water pressure with 2 cm expiratory relief his residual AHI is 4.0 he does have high air leakage 95th percentile is 17 L/min.  He does have equal amounts of central and obstructive apneas arising.   Sleep relevant medical history: deviate septum repair, sinus surgery.   Family medical /sleep history: NO other family member on CPAP with OSA, insomnia, sleep walkers.    Social history:  Patient is retired from Social research officer, government , disability administrative judge , used to work at [employer].   and lives in a household with spouse, one adult son, 3 grands.   persons/ alone. One dog  present. Tobacco use/.  quit 1980. ETOH use ; 2 beers  a week,  Caffeine intake in form of Coffee( 5-6 / day) Soda( /) Tea ( /) or energy drinks. Regular exercise in form of GYM, 3 days a week, walking .   Hobbies :reading,      Sleep habits are as follows: The patient's dinner time is between 5-7 PM. The patient goes to bed at 10-11 PM and takes  a sleep aid, restoril, continues to sleep for 7 hours, wakes rarely for bathroom breaks. The preferred sleep position is supine,  with the support of 1 pillow. Rare GERD. Dreams are reportedly infrequent/vivid.   7.30  AM is the usual rise time. The patient wakes up spontaneously/. He reports not feeling refreshed or restored in AM, with symptoms such as dry mouth, morning headaches, and residual fatigue. Sinus pressure.  Naps are taken infrequently.    Review of Systems: Out of a complete 14 system review, the patient complains of only the following symptoms, and all other reviewed systems are negative.:   His Epworth sleepiness score is endorsed at 3 out of 24 points so there is no reason to consult of concern of hypersomnia.  His fatigue score is 17 out of 63 which is actually a rare low score in the patient in his late 16s.  He also endorsed the geriatric depression score today at 3 out of 15 points which is not indicative of clinical depression.   ON CPAP and  feeling good. ! Travel s with a travel CPAP, new S 11 CPAP was issued in late 2022.    How likely are you to doze in the following situations: 0 = not likely, 1 = slight chance, 2 = moderate chance, 3 = high chance   Sitting and Reading? Watching Television? Sitting inactive in a public place (theater or meeting)? As a passenger in a car for an hour without a break? Lying down in the afternoon when circumstances permit? Sitting and talking to someone? Sitting quietly after lunch without alcohol? In a car, while stopped for a few minutes in traffic?   Total = 3 - down from 7-/ 24 points   postviral fatigue after October Covid 19.  32/ 63  Antiviral therapy.  FSS endorsed at 17/ 63 points.   GDS 4 / 15 points.  Hoarseness, nasal deviation.   Social History   Socioeconomic History   Marital status: Married    Spouse name: Luis Fernandez   Number of children: 1   Years of education: 19   Highest education level: Doctorate  Occupational History   Occupation: Clinical research associate    Comment: retired  Tobacco Use   Smoking status: Former    Packs/day: 1.50    Years: 10.00    Total pack years: 15.00    Types: Cigarettes, Pipe    Quit date: 08/17/1976    Years since quitting: 46.0   Smokeless tobacco: Never  Vaping Use   Vaping Use: Never used  Substance and Sexual Activity   Alcohol use: Yes    Alcohol/week: 1.0 standard drink of alcohol    Types: 1 Cans of beer per week    Comment: 2-3 beers a week, occasionally/glass of wine 2 times weekly   Drug use: Never   Sexual activity: Yes  Other Topics Concern   Not on file  Social History Narrative   Lives with spouse at home. He has one child. He enjoys reading, travelling and gardening.   Social Determinants of Health   Financial Resource Strain: Low Risk  (04/27/2022)   Overall Financial Resource Strain (CARDIA)    Difficulty of Paying Living Expenses: Not hard at all  Food Insecurity: No Food Insecurity (04/27/2022)   Hunger Vital Sign    Worried About Running Out of Food in the Last Year: Never true    Ran Out of Food in the Last Year: Never true  Transportation Needs: No Transportation Needs (04/27/2022)   PRAPARE - Administrator, Civil Service (Medical): No    Lack of Transportation (Non-Medical): No  Physical  Activity: Sufficiently Active (04/27/2022)   Exercise Vital Sign    Days of Exercise per Week: 7 days    Minutes of Exercise per Session: 40 min  Stress: No Stress Concern Present (04/27/2022)   [employer] - Occupational Stress Questionnaire    Feeling of Stress : Only a little  Social Connections: Unknown (04/27/2022)    Social Connection and Isolation Panel [NHANES]    Frequency of Communication with Friends and Family: Twice a week    Frequency of Social Gatherings with Friends and Family: Once a week    Attends Religious Services: Not on Marketing executivefile    Active Member of Clubs or Organizations: No    Attends Engineer, structuralClub or Organization Meetings: 1 to 4 times per year    Marital Status: Married    Family History  Problem Relation Age of Onset   Heart disease Mother    Hypertension Mother    Parkinson's disease Father    Dementia Brother    Hypertension Brother    Depression Brother    COPD Brother    Colon cancer Neg Hx    Stomach cancer Neg Hx    Rectal cancer Neg Hx    Pancreatic cancer Neg Hx    Esophageal cancer Neg Hx     Past Medical History:  Diagnosis Date   Allergy    Arthritis    Cataract    Surgical correction in both eyes   Diabetes mellitus without complication (HCC)    Hypertension    Sleep apnea    use cpap machine nightly   Steroid-induced adrenal suppression (HCC) 01/21/2016   On medical reccords     Past Surgical History:  Procedure Laterality Date   CARPAL TUNNEL RELEASE     CATARACT EXTRACTION     EYE SURGERY     cataract surgeries both eyes pre 2005   HERNIA REPAIR  2002?   JOINT REPLACEMENT  07/09/2008   left  total knee replacement   NOSE SURGERY     TOTAL KNEE ARTHROPLASTY     UMBILICAL HERNIA REPAIR       Current Outpatient Medications on File Prior to Visit  Medication Sig Dispense Refill   cephALEXin (KEFLEX) 500 MG capsule Take 1 capsule (500 mg total) by mouth 3 (three) times daily. Before dental work 21 capsule 1   Cyanocobalamin (VITAMIN B12 PO) Take by mouth.     Dulaglutide (TRULICITY) 3 MG/0.5ML SOPN Inject 3 mg as directed once a week. 6 mL 1   FARXIGA 10 MG TABS tablet TAKE ONE TABLET BY MOUTH DAILY 90 tablet 2   fenofibrate (TRICOR) 145 MG tablet TAKE ONE TABLET BY MOUTH DAILY 90 tablet 2   Glucosamine 500 MG CAPS Take by mouth.     ibuprofen  (ADVIL) 600 MG tablet Take 1 tablet (600 mg total) by mouth every 8 (eight) hours as needed. 270 tablet 3   Lancets (ONETOUCH DELICA PLUS LANCET33G) MISC USE TO CHECK BLOOD SUGAR THREE TIMES A DAY 300 each PRN   LORazepam (ATIVAN) 0.5 MG tablet TAKE 1 TABLET BY MOUTH DAILY 30 tablet 3   losartan (COZAAR) 50 MG tablet TAKE 1 TABLET BY MOUTH DAILY 90 tablet 1   MELATONIN PO Take by mouth.     metFORMIN (GLUCOPHAGE-XR) 500 MG 24 hr tablet TAKE TWO TABLETS BY MOUTH TWICE A DAY 360 tablet 3   montelukast (SINGULAIR) 10 MG tablet TAKE ONE TABLET BY MOUTH EVERY NIGHT AT BEDTIME 90 tablet 2  Multiple Vitamins-Minerals (MULTIVITAMIN PO) Take by mouth.     Omega-3 Fatty Acids (FISH OIL PO) Take by mouth.     ONETOUCH ULTRA test strip USE ONE STRIP TO TEST THREE TIMES A DAY 250 strip 1   pravastatin (PRAVACHOL) 80 MG tablet TAKE ONE TABLET BY MOUTH DAILY 90 tablet 3   temazepam (RESTORIL) 30 MG capsule TAKE ONE CAPSULE BY MOUTH EVERY NIGHT AT BEDTIME AS NEEDED FOR SLEEP 30 capsule 5   No current facility-administered medications on file prior to visit.  Physical exam:  Today's Vitals   08/27/22 1418  BP: 129/79  Pulse: 81  Weight: 225 lb (102.1 kg)  Height: 6\' 3"  (1.905 m)   Body mass index is 28.12 kg/m.   Wt Readings from Last 3 Encounters:  08/27/22 225 lb (102.1 kg)  07/22/22 233 lb (105.7 kg)  04/21/22 232 lb (105.2 kg)     Ht Readings from Last 3 Encounters:  08/27/22 6\' 3"  (1.905 m)  07/22/22 6\' 3"  (1.905 m)  04/21/22 6\' 3"  (1.905 m)      General: The patient is awake, alert and appears not in acute distress. The patient is well groomed.  Facial hair, long hair.  Head: Normocephalic, atraumatic. Neck is supple. Mallampati 1,  neck circumference:18 inches . Nasal airflow barely patent.  Retrognathia is not  seen.  Dental status: biological teeth and one Fernandez - front teeth Cardiovascular:  Regular rate and cardiac rhythm by pulse- without distended neck veins. Respiratory:  Lungs are clear to auscultation.  Skin:  Without evidence of ankle edema, or rash.  Varicose veins. Trunk: The patient's posture is erect.   Neurologic exam : The patient is awake and alert, oriented to place and time.   Memory subjective described as intact.  Attention span & concentration ability appears normal.  Speech is fluent, without dysarthria, but with dysphonia.  Mood and affect are appropriate.   Cranial nerves: no loss of smell or taste reported  Pupils are equal and briskly reactive to light. Funduscopic exam deferred .  Extraocular movements in vertical and horizontal planes were intact . Visual fields by finger perimetry are intact. Hearing was intact to soft voice and finger rubbing.    Facial sensation intact to fine touch.  Facial motor strength is symmetric and tongue and uvula move midline.  Neck ROM : rotation, tilt and flexion extension were normal for age and shoulder shrug was symmetrical.    Motor exam:  Symmetric bulk, tone and ROM.   Normal tone without cog -wheeling, symmetric grip strength . Sensory:  Fine touch, pinprick and vibration were tested  and  normal.  Proprioception tested in the upper extremities was normal Coordination: Rapid alternating movements in the fingers/hands were of normal speed.  The Finger-to-nose maneuver was intact action tremor.bilaterally seen with action not at rest, low amplitude.   Gait and station: Patient could rise unassisted from a seated position, walked without assistive device.  Stance is of normal width/ base and the patient turned with  steps.  Toe and heel walk were deferred.  Deep tendon reflexes: in the  upper and lower extremities are intact. 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  Right hip pain gives him ROM restriction. Babinski response was deferred .       After spending a total time of  20.5  minutes face to face and additional time for physical and neurologic examination, review of laboratory  studies,  personal review of imaging studies, reports  and results of other testing and review of referral information / records as far as provided in visit, I have established the following assessments:  1) Luis Fernandez has severe OSA- confirmed by HST , and is highly compliant with his new CPAP machine, issued 06-03-2021. He has achieved good control and reduction of fatigue as well as daytime sleepiness.  2) he has not mentioned his insomnia today- I would be very happy if he can d/c regular benzodiazepine.      My Plan is to proceed with:  1) nasal patency is good, nasal pillow well tolerated. 6-14 cm water auto CPAP.  2 cm . Highly compliant CPAP user, for many years now - yearly revisit with MD ( me ) . Machine issued [phone].  I would like to thank [name], DO  for allowing me to meet with and to take care of this pleasant patient.   In short, Shivaan Tierno is presenting with almost 2 decades of CPAP use and with a new machine.  I plan to follow up  personally every 12 months.   CC: I will share my notes with PCP.  Electronically signed by: Melvyn Novas, MD 08/27/2022 2:52 PM  [facility] Board certified by The [employer] of Sleep Medicine and Diplomate of the [employer] of Sleep Medicine. Board certified In Neurology through the ABPN, Fellow of the [employer] of Neurology. Medical Director of [facility].

## 2022-08-31 ENCOUNTER — Other Ambulatory Visit: Payer: Self-pay | Admitting: Family Medicine

## 2022-09-16 ENCOUNTER — Other Ambulatory Visit: Payer: Self-pay | Admitting: Family Medicine

## 2022-09-16 DIAGNOSIS — E1142 Type 2 diabetes mellitus with diabetic polyneuropathy: Secondary | ICD-10-CM

## 2022-09-30 ENCOUNTER — Encounter: Payer: Self-pay | Admitting: Family Medicine

## 2022-09-30 ENCOUNTER — Ambulatory Visit (INDEPENDENT_AMBULATORY_CARE_PROVIDER_SITE_OTHER): Payer: Medicare Other | Admitting: Family Medicine

## 2022-09-30 VITALS — BP 150/90 | HR 86 | Temp 98.7°F | Ht 75.0 in | Wt 232.2 lb

## 2022-09-30 DIAGNOSIS — E1142 Type 2 diabetes mellitus with diabetic polyneuropathy: Secondary | ICD-10-CM

## 2022-09-30 DIAGNOSIS — E785 Hyperlipidemia, unspecified: Secondary | ICD-10-CM

## 2022-09-30 DIAGNOSIS — I152 Hypertension secondary to endocrine disorders: Secondary | ICD-10-CM

## 2022-09-30 DIAGNOSIS — R9431 Abnormal electrocardiogram [ECG] [EKG]: Secondary | ICD-10-CM

## 2022-09-30 DIAGNOSIS — E1159 Type 2 diabetes mellitus with other circulatory complications: Secondary | ICD-10-CM | POA: Diagnosis not present

## 2022-09-30 DIAGNOSIS — E538 Deficiency of other specified B group vitamins: Secondary | ICD-10-CM | POA: Diagnosis not present

## 2022-09-30 DIAGNOSIS — F5101 Primary insomnia: Secondary | ICD-10-CM

## 2022-09-30 DIAGNOSIS — G4733 Obstructive sleep apnea (adult) (pediatric): Secondary | ICD-10-CM | POA: Diagnosis not present

## 2022-09-30 DIAGNOSIS — M65341 Trigger finger, right ring finger: Secondary | ICD-10-CM | POA: Diagnosis not present

## 2022-09-30 MED ORDER — MONTELUKAST SODIUM 10 MG PO TABS: 10.0000 mg | ORAL_TABLET | Freq: Every day | ORAL | 2 refills | Status: DC

## 2022-09-30 MED ORDER — TRULICITY 3 MG/0.5ML ~~LOC~~ SOAJ: 3.0000 mg | SUBCUTANEOUS | 1 refills | Status: DC

## 2022-09-30 MED ORDER — LOSARTAN POTASSIUM 50 MG PO TABS: 50.0000 mg | ORAL_TABLET | Freq: Every day | ORAL | 1 refills | Status: DC

## 2022-09-30 NOTE — Patient Instructions (Signed)
It was very nice to see you today!  [facility] at [facility] on the 1st floor Phone number [phone]    PLEASE NOTE:  If you had any lab tests please let us know if you have not heard back within a few days. You may see your results on MyChart before we have a chance to review them but we will give you a call once they are reviewed by [place]. If we ordered any referrals today, please let us know if you have not heard from their office within the next week.   Please try these tips to maintain a healthy lifestyle:  Eat most of your calories during the day when you are active. Eliminate processed foods including packaged sweets (pies, cakes, cookies), reduce intake of potatoes, white bread, white pasta, and white rice. Look for whole grain options, oat flour or almond flour.  Each meal should contain half fruits/vegetables, one quarter protein, and one quarter carbs (no bigger than a computer mouse).  Cut down on sweet beverages. This includes juice, soda, and sweet tea. Also watch fruit intake, though this is a healthier sweet option, it still contains natural sugar! Limit to 3 servings daily.  Drink at least 1 glass of water with each meal and aim for at least 8 glasses per day  Exercise at least 150 minutes every week.

## 2022-09-30 NOTE — Progress Notes (Unsigned)
New Patient Office Visit  Subjective:  Patient ID: Luis Fernandez, male    DOB: 12/31/1945  Age: 77 y.o. MRN: [id number]  CC:  Chief Complaint  Patient presents with   Establish Care    Need new pcp Trigger Finger, referral to specialist for shots    HPI Luis Fernandez presents for new pt.  Trigger finger, dm-wants to be closer to home  DM type 2-trulicity, farxiga, metformin   running 120's.    Occ 200's.  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 Getting stuck.    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   JOINT REPLACEMENT  07/09/2008   left  total knee replacement   NOSE SURGERY     TOTAL KNEE ARTHROPLASTY     UMBILICAL HERNIA REPAIR      Family History  Problem Relation Age of Onset   Heart disease Mother    Hypertension Mother    Parkinson's disease Father    Dementia Brother    Hypertension Brother    Depression Brother    COPD Brother    Colon cancer Neg Hx    Stomach cancer Neg Hx    Rectal cancer Neg Hx    Pancreatic cancer Neg Hx    Esophageal cancer Neg Hx     Social History   Socioeconomic History   Marital status: Married    Spouse name: [name]   Number of children: 1   Years of education: 53   Highest education level: Doctorate  Occupational History   Occupation: Chief Executive Officer    Comment: retired  Tobacco Use   Smoking status: Former     Packs/day: 1.50    Years: 10.00    Total pack years: 15.00    Types: Cigarettes, Pipe    Quit date: 08/17/1976    Years since quitting: 46.1   Smokeless tobacco: Never  Vaping Use   Vaping Use: Never used  Substance and Sexual Activity   Alcohol use: Yes    Alcohol/week: 1.0 standard drink of alcohol    Types: 1 Cans of beer per week    Comment: 2-3 beers a week, occasionally/glass of wine 2 times weekly   Drug use: Never   Sexual activity: Yes  Other Topics Concern   Not on file  Social History Narrative   Lives with spouse at home. He has one child. He enjoys reading, travelling and gardening.   Social Determinants of Health   Financial Resource Strain: Low Risk  (04/27/2022)   Overall Financial Resource Strain (CARDIA)    Difficulty of Paying Living Expenses: Not hard at all  Food Insecurity: No Food Insecurity (04/27/2022)   Hunger Vital Sign    Worried About Running Out of Food in the Last Year: Never true    Ran Out  of Food in the Last Year: Never true  Transportation Needs: No Transportation Needs (04/27/2022)   PRAPARE - Hydrologist (Medical): No    Lack of Transportation (Non-Medical): No  Physical Activity: Sufficiently Active (04/27/2022)   Exercise Vital Sign    Days of Exercise per Week: 7 days    Minutes of Exercise per Session: 40 min  Stress: No Stress Concern Present (04/27/2022)   [place]    Feeling of Stress : Only a little  Social Connections: Unknown (04/27/2022)   Social Connection and Isolation Panel [NHANES]    Frequency of Communication with Friends and Family: Twice a week    Frequency of Social Gatherings with Friends and Family: Once a week    Attends Religious Services: Not on Advertising copywriter or Organizations: No    Attends Archivist Meetings: 1 to 4 times per year    Marital Status: Married  Human resources officer Violence: Not  At Risk (04/29/2022)   Humiliation, Afraid, Rape, and Kick questionnaire    Fear of Current or Ex-Partner: No    Emotionally Abused: No    Physically Abused: No    Sexually Abused: No    ROS  ROS: Gen: no fever, chills .  Less energy.  Can work in yard for 2 hrs and needs break..  walks 1.5 miles/day Skin: no rash, itching ENT: no ear pain, ear drainage, nasal congestion, rhinorrhea, sinus pressure, sore throat Eyes: no blurry vision, double vision Resp: no cough, wheeze,SOB CV: no CP, palpitations, LE edema,  GI: no heartburn, n/v/d/c, abd pain GU: no dysuria, urgency, frequency, hematuria MSK: no joint pain, myalgias, back pain Neuro: no dizziness, headache, weakness, vertigo Psych: no depression, anxiety, insomnia, SI   Objective:   Today's Vitals: BP (!) 140/60   Pulse 86   Temp 98.7 F (37.1 C) (Temporal)   Ht '6\' 3"'$  (1.905 m)   Wt 232 lb 4 oz (105.3 kg)   SpO2 97%   BMI 29.03 kg/m   Physical Exam  Gen: WDWN NAD HEENT: NCAT, conjunctiva not injected, sclera nonicteric TM WNL B, OP moist, no exudates  NECK:  supple, no thyromegaly, no nodes, no carotid bruits CARDIAC: RRR, S1S2+, no murmur. DP 2+B LUNGS: CTAB. No wheezes ABDOMEN:  BS+, soft, NTND, No HSM, no masses EXT:  no edema MSK: no gross abnormalities.  NEURO: A&O x3.  CN II-XII intact.  PSYCH: normal mood. Good eye contact   Assessment & Plan:   Problem List Items Addressed This Visit   None   Outpatient Encounter Medications as of 09/30/2022  Medication Sig   cephALEXin (KEFLEX) 500 MG capsule Take 1 capsule (500 mg total) by mouth 3 (three) times daily. Before dental work   Cyanocobalamin (VITAMIN B12 PO) Take by mouth.   Dulaglutide (TRULICITY) 3 0000000 SOPN Inject 3 mg as directed once a week.   FARXIGA 10 MG TABS tablet TAKE ONE TABLET BY MOUTH DAILY   fenofibrate (TRICOR) 145 MG tablet TAKE ONE TABLET BY MOUTH DAILY   Glucosamine 500 MG CAPS Take by mouth.   ibuprofen (ADVIL) 600 MG tablet  Take 1 tablet (600 mg total) by mouth every 8 (eight) hours as needed.   Lancets (ONETOUCH DELICA PLUS 123XX123) MISC USE AS DIRECTED THREE TIMES A DAY   LORazepam (ATIVAN) 0.5 MG tablet TAKE 1 TABLET BY MOUTH DAILY   losartan (COZAAR) 50 MG tablet  TAKE 1 TABLET BY MOUTH DAILY   MELATONIN PO Take by mouth.   metFORMIN (GLUCOPHAGE-XR) 500 MG 24 hr tablet TAKE TWO TABLETS BY MOUTH TWICE A DAY   montelukast (SINGULAIR) 10 MG tablet TAKE ONE TABLET BY MOUTH EVERY NIGHT AT BEDTIME   Multiple Vitamins-Minerals (MULTIVITAMIN PO) Take by mouth.   Omega-3 Fatty Acids (FISH OIL PO) Take by mouth.   ONETOUCH ULTRA test strip USE 1 STRIP TO TEST THREE TIMES A DAY   pravastatin (PRAVACHOL) 80 MG tablet TAKE ONE TABLET BY MOUTH DAILY   temazepam (RESTORIL) 30 MG capsule TAKE ONE CAPSULE BY MOUTH EVERY NIGHT AT BEDTIME AS NEEDED FOR SLEEP   No facility-administered encounter medications on file as of 09/30/2022.    Follow-up: No follow-ups on file.   Wellington Hampshire, MD

## 2022-10-01 ENCOUNTER — Encounter: Payer: Self-pay | Admitting: Family Medicine

## 2022-10-01 LAB — CBC WITH DIFFERENTIAL/PLATELET
Basophils Absolute: 0.1 10*3/uL (ref 0.0–0.1)
Basophils Relative: 1.1 % (ref 0.0–3.0)
Eosinophils Absolute: 0.1 10*3/uL (ref 0.0–0.7)
Eosinophils Relative: 2.8 % (ref 0.0–5.0)
HCT: 45.2 % (ref 39.0–52.0)
Hemoglobin: 15.7 g/dL (ref 13.0–17.0)
Lymphocytes Relative: 29 % (ref 12.0–46.0)
Lymphs Abs: 1.4 10*3/uL (ref 0.7–4.0)
MCHC: 34.7 g/dL (ref 30.0–36.0)
MCV: 95.8 fl (ref 78.0–100.0)
Monocytes Absolute: 0.3 10*3/uL (ref 0.1–1.0)
Monocytes Relative: 6.9 % (ref 3.0–12.0)
Neutro Abs: 2.9 10*3/uL (ref 1.4–7.7)
Neutrophils Relative %: 60.2 % (ref 43.0–77.0)
Platelets: 269 10*3/uL (ref 150.0–400.0)
RBC: 4.72 Mil/uL (ref 4.22–5.81)
RDW: 13.2 % (ref 11.5–15.5)
WBC: 4.9 10*3/uL (ref 4.0–10.5)

## 2022-10-01 LAB — COMPREHENSIVE METABOLIC PANEL
ALT: 29 U/L (ref 0–53)
AST: 27 U/L (ref 0–37)
Albumin: 4.3 g/dL (ref 3.5–5.2)
Alkaline Phosphatase: 41 U/L (ref 39–117)
BUN: 20 mg/dL (ref 6–23)
CO2: 24 mEq/L (ref 19–32)
Calcium: 9.9 mg/dL (ref 8.4–10.5)
Chloride: 107 mEq/L (ref 96–112)
Creatinine, Ser: 0.81 mg/dL (ref 0.40–1.50)
GFR: 84.92 mL/min (ref >60.00)
Glucose, Bld: 158 mg/dL — ABNORMAL HIGH (ref 70–99)
Potassium: 4 mEq/L (ref 3.5–5.1)
Sodium: 142 mEq/L (ref 135–145)
Total Bilirubin: 0.6 mg/dL (ref 0.2–1.2)
Total Protein: 6.5 g/dL (ref 6.0–8.3)

## 2022-10-01 LAB — LIPID PANEL
Cholesterol: 130 mg/dL (ref 0–200)
HDL: 31.4 mg/dL — ABNORMAL LOW (ref >39.00)
NonHDL: 99.08
Total CHOL/HDL Ratio: 4
Triglycerides: 284 mg/dL — ABNORMAL HIGH (ref 0.0–149.0)
VLDL: 56.8 mg/dL — ABNORMAL HIGH (ref 0.0–40.0)

## 2022-10-01 LAB — VITAMIN B12: Vitamin B-12: 757 pg/mL (ref 211–911)

## 2022-10-01 LAB — TSH: TSH: 0.61 u[IU]/mL (ref 0.35–5.50)

## 2022-10-01 LAB — LDL CHOLESTEROL, DIRECT: Direct LDL: 76 mg/dL

## 2022-10-01 NOTE — Progress Notes (Signed)
Labs look great except: 1.  Triglycerides are elevated.  Continue medications.  Make sure you are taking fish oil 4000 mg/day.  Need to keep sugars controlled.  Work on diet/exercise. 2.  Regarding the abnormal EKG-I cannot find any old ones to compare it to.  I suggest referral to cardiology.  If willing, please send referral

## 2022-10-02 ENCOUNTER — Encounter: Payer: Self-pay | Admitting: Family Medicine

## 2022-10-02 ENCOUNTER — Other Ambulatory Visit: Payer: Self-pay | Admitting: *Deleted

## 2022-10-02 DIAGNOSIS — R9431 Abnormal electrocardiogram [ECG] [EKG]: Secondary | ICD-10-CM

## 2022-10-06 ENCOUNTER — Encounter: Payer: Self-pay | Admitting: Family Medicine

## 2022-10-06 ENCOUNTER — Ambulatory Visit: Payer: Self-pay

## 2022-10-06 ENCOUNTER — Ambulatory Visit (INDEPENDENT_AMBULATORY_CARE_PROVIDER_SITE_OTHER): Payer: Medicare Other | Admitting: Family Medicine

## 2022-10-06 VITALS — BP 140/82 | HR 85 | Ht 75.0 in | Wt 229.2 lb

## 2022-10-06 DIAGNOSIS — M720 Palmar fascial fibromatosis [Dupuytren]: Secondary | ICD-10-CM

## 2022-10-06 DIAGNOSIS — M79644 Pain in right finger(s): Secondary | ICD-10-CM

## 2022-10-06 DIAGNOSIS — M65351 Trigger finger, right little finger: Secondary | ICD-10-CM

## 2022-10-06 DIAGNOSIS — M65341 Trigger finger, right ring finger: Secondary | ICD-10-CM | POA: Diagnosis not present

## 2022-10-06 NOTE — Patient Instructions (Addendum)
Thank you for coming in today.   You received an injection today. Seek immediate medical attention if the joint becomes red, extremely painful, or is oozing fluid.   Use the double bandaid splint   If we need hand therapy there is a very good OT here in town I like.

## 2022-10-06 NOTE — Progress Notes (Signed)
I, Josepha Pigg, CMA acting as a scribe for Lynne Leader, MD.  Luis Fernandez is a 77 y.o. male who presents to [facility] today for hand pain.  Patient was previously seen by Dr. Georgina Snell on 02/16/2022 for right hip pain.  Today, patient presents with hand pain x1 month.  Patient locates pain to his fourth-fifth fingers of he right hand, with triggering present. Pt is RHD. Notes decreased ROM in the 4th finger. C/o palpable knots on the palm of the hand. Sx are worse first thing in the mornings. Sx worsen with use. Noticeable decrease in the right hand.   Grip strength: YES Aggravates: use Treatments tried: stretching, hot tub    Pertinent review of systems: No fevers or chills  Relevant historical information: Hypertension, diabetes, peripheral arterial disease.   Exam:  BP (!) 140/82   Pulse 85   Ht '6\' 3"'$  (1.905 m)   Wt 229 lb 3.2 oz (104 kg)   SpO2 94%   BMI 28.65 kg/m  General: Well Developed, well nourished, and in no acute distress.   MSK: Right hands: Minimal Dupuytren's contracture nodules are present. Tender palpation palmar aspect of skin overlying fourth and fifth MCPs.  Some triggering is present with flexion of fourth and fifth digit.  Intact strength.    Lab and Radiology Results  Procedure: Real-time Ultrasound Guided Injection of right fourth MCP A1 pulley tendon sheath.  Trigger finger injection Device: Philips Affiniti 50G Images permanently stored and available for review in PACS Verbal informed consent obtained.  Discussed risks and benefits of procedure. Warned about infection, bleeding, hyperglycemia damage to structures among others. Patient expresses understanding and agreement Time-out conducted.   Noted no overlying erythema, induration, or other signs of local infection.   Skin prepped in a sterile fashion.   Local anesthesia: Topical Ethyl chloride.   With sterile technique and under real time ultrasound  guidance: 40 mg of Kenalog and 1 mL of lidocaine injected into tendon sheath at A1 pulley. Fluid seen entering the tendon sheath.   Completed without difficulty   Pain immediately resolved suggesting accurate placement of the medication.   Advised to call if fevers/chills, erythema, induration, drainage, or persistent bleeding.   Images permanently stored and available for review in the ultrasound unit.  Impression: Technically successful ultrasound guided injection.    Procedure: Real-time Ultrasound Guided Injection of right fifth MCP tendon sheath and A1 pulley.  Trigger finger injection Device: Philips Affiniti 50G Images permanently stored and available for review in PACS Verbal informed consent obtained.  Discussed risks and benefits of procedure. Warned about infection, bleeding, hyperglycemia damage to structures among others. Patient expresses understanding and agreement Time-out conducted.   Noted no overlying erythema, induration, or other signs of local infection.   Skin prepped in a sterile fashion.   Local anesthesia: Topical Ethyl chloride.   With sterile technique and under real time ultrasound guidance: 40 mg of Kenalog and 1 mL of lidocaine injected into tendon sheath. Fluid seen entering the tendon sheath.   Completed without difficulty   Pain immediately resolved suggesting accurate placement of the medication.   Advised to call if fevers/chills, erythema, induration, drainage, or persistent bleeding.   Images permanently stored and available for review in the ultrasound unit.  Impression: Technically successful ultrasound guided injection.         Assessment and Plan: 77 y.o. male with right hand pain and lack of hand motion thought to be due to trigger  finger right fourth and fifth digit.  He does have mild de Quervain's tenosynovitis at this area which could be contributory.  Plan for steroid injection.  Recommend also double Band-Aid splint which was shown how  to use.  Consider hand therapy if not improved.  Check back as needed.   PDMP not reviewed this encounter. Orders Placed This Encounter  Procedures   [place] LIMITED JOINT SPACE STRUCTURES UP RIGHT(NO LINKED CHARGES)    Order Specific Question:   Reason for Exam (SYMPTOM  OR DIAGNOSIS REQUIRED)    Answer:   R 4th and 5th finger pain    Order Specific Question:   Preferred imaging location?    Answer:   [place]   No orders of the defined types were placed in this encounter.    Discussed warning signs or symptoms. Please see discharge instructions. Patient expresses understanding.   The above documentation has been reviewed and is accurate and complete Lynne Leader, M.D.

## 2022-10-19 ENCOUNTER — Encounter: Payer: Self-pay | Admitting: Family Medicine

## 2022-10-29 ENCOUNTER — Other Ambulatory Visit: Payer: Self-pay | Admitting: *Deleted

## 2022-10-29 MED ORDER — EZETIMIBE 10 MG PO TABS: 10.0000 mg | ORAL_TABLET | Freq: Every day | ORAL | 3 refills | Status: DC

## 2022-10-30 ENCOUNTER — Emergency Department (HOSPITAL_COMMUNITY)
Admission: EM | Admit: 2022-10-30 | Discharge: 2022-10-30 | Disposition: A | Discharge status: Home / Self Care | Payer: Medicare Other | Attending: Emergency Medicine | Admitting: Emergency Medicine

## 2022-10-30 ENCOUNTER — Other Ambulatory Visit: Payer: Self-pay

## 2022-10-30 ENCOUNTER — Emergency Department (HOSPITAL_BASED_OUTPATIENT_CLINIC_OR_DEPARTMENT_OTHER): Payer: Medicare Other

## 2022-10-30 ENCOUNTER — Encounter (HOSPITAL_COMMUNITY): Payer: Self-pay

## 2022-10-30 ENCOUNTER — Ambulatory Visit (HOSPITAL_COMMUNITY)
Admission: EM | Admit: 2022-10-30 | Discharge: 2022-10-30 | Disposition: A | Discharge status: Home / Self Care | Payer: Medicare Other | Attending: Emergency Medicine | Admitting: Emergency Medicine

## 2022-10-30 ENCOUNTER — Emergency Department (HOSPITAL_COMMUNITY): Payer: Medicare Other

## 2022-10-30 DIAGNOSIS — M79661 Pain in right lower leg: Secondary | ICD-10-CM | POA: Diagnosis not present

## 2022-10-30 NOTE — Progress Notes (Signed)
Right lower extremity venous study completed.   Preliminary results relayed to MD and RN.  Please see CV Procedures for preliminary results.  Luis Fernandez, RVT  11:06 AM 10/30/22

## 2022-10-30 NOTE — Discharge Instructions (Addendum)
Advise you to head to the nearest emergency department for further evaluation.  I was going to send you to our DVT clinic, however, they are closed for Good Friday.  Please return to clinic or seek immediate care if you develop shortness of breath, chest pain, or any changes.

## 2022-10-30 NOTE — ED Notes (Signed)
Patient transported to X-ray 

## 2022-10-30 NOTE — ED Provider Notes (Signed)
[facility]    CSN: [id number] Arrival date & time: 10/30/22  [id number]      History   Chief Complaint Chief Complaint  Patient presents with   Leg Pain    HPI Luis Fernandez is a 77 y.o. male.   Patient presents to clinic for right calf pain that started yesterday.  In the morning he took his dog for a walk and then he sat down and was not very active for all yesterday.  Denies any falls or trauma during the walk.  He got up later that evening and felt pain initially at his posterior ankle and it gradually progressed up to his calf and behind his knee.  He reports the pain is similar to a cramp but not like a cramp.  He was having leg cramping with pravastatin, he stopped this about 2 weeks ago and the cramping resolved directly after stopping this medication.  He has not yet started his Zetia.  Reports he can feel calf discomfort with ambulation.  He denies chest pain or shortness of breath, denies history of deep vein thrombosis.  Oxygenation is 93% on room air, normally in the upper 90s.   The history is provided by the patient.  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   when comes off prednisone    Patient Active Problem List   Diagnosis Date Noted   Acute recurrent frontal sinusitis 04/21/2022   Motion sickness 01/19/2022   OSA on CPAP 08/19/2021   PAD (peripheral artery disease) ([place]) 08/19/2021   COVID-19 08/19/2021   Hamstring tendinitis at origin 07/04/2021   Bilateral hip pain 01/12/2021   Recurrent sinusitis 01/10/2020   Paresthesia 06/23/2016   OSA (obstructive sleep apnea) 06/23/2016   B12 deficiency  01/21/2016   Diabetes with neurologic complications ([place]) [id number]   Hyperlipidemia LDL goal <70 12/17/2015   Hypertension associated with diabetes ([place]) 12/17/2015   Overweight (BMI 25.0-29.9) 12/17/2015   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   NOSE SURGERY     NOSE SURGERY     bone spurs   TOTAL KNEE ARTHROPLASTY     UMBILICAL HERNIA REPAIR         Home Medications    Prior to Admission medications   Medication Sig Start Date End Date Taking? Authorizing Provider  Cyanocobalamin (VITAMIN B12 PO) Take by mouth.   Yes [provider]  Dulaglutide (TRULICITY) 3 0000000 SOPN Inject 3 mg as directed once a week. 09/30/22  Yes Tawnya Crook, MD  ezetimibe (ZETIA) 10 MG tablet Take 1 tablet (10 mg total) by mouth daily. 10/29/22  Yes Tawnya Crook, MD  FARXIGA 10 MG TABS tablet TAKE ONE TABLET BY MOUTH DAILY 08/31/22  Yes Luetta Nutting, DO  fenofibrate (TRICOR) 145 MG tablet TAKE ONE TABLET BY MOUTH DAILY 04/09/22  Yes Luetta Nutting, DO  Glucosamine 500 MG CAPS Take by mouth.   Yes [provider]  ibuprofen (ADVIL) 600 MG tablet Take 1 tablet (600 mg total) by mouth every 8 (eight) hours as needed. 01/19/22  Yes Zigmund Daniel, Einar Pheasant, DO  Lancets [facility] DELICA PLUS [id number]) MISC USE AS DIRECTED THREE TIMES A DAY 09/17/22  Yes Luetta Nutting, DO  LORazepam (ATIVAN) 0.5 MG tablet TAKE 1 TABLET BY MOUTH DAILY 07/03/22  Yes Luetta Nutting, DO  losartan (COZAAR) 50 MG tablet Take 1 tablet (50 mg total) by mouth daily. 09/30/22  Yes Tawnya Crook, MD  MELATONIN PO Take by mouth.   Yes [provider]  metFORMIN (GLUCOPHAGE-XR) 500 MG 24 hr tablet  TAKE TWO TABLETS BY MOUTH TWICE A DAY 02/13/22  Yes Zigmund Daniel, [place], DO  montelukast (SINGULAIR) 10 MG tablet Take 1 tablet (10 mg total) by mouth at bedtime. 09/30/22  Yes Tawnya Crook, MD  Multiple Vitamins-Minerals (MULTIVITAMIN PO) Take by mouth.   Yes [provider]  Omega-3 Fatty Acids (FISH OIL PO) Take by mouth.   Yes [provider]  [facility] ULTRA test strip USE 1 STRIP TO TEST THREE TIMES A DAY 09/17/22  Yes Luetta Nutting, DO  temazepam (RESTORIL) 30 MG capsule TAKE ONE CAPSULE BY MOUTH EVERY NIGHT AT BEDTIME AS NEEDED FOR SLEEP 07/16/22  Yes Luetta Nutting, DO    Family History Family History  Problem Relation Age of Onset   Heart disease Mother    Hypertension Mother    Parkinson's disease Father    Dementia Brother    Hypertension Brother    Depression Brother    COPD Brother    Colon cancer Neg Hx    Stomach cancer Neg Hx    Rectal cancer Neg Hx    Pancreatic cancer Neg Hx    Esophageal cancer Neg Hx     Social History Social History   Tobacco Use   Smoking status: Former    Packs/day: 1.50    Years: 10.00    Additional pack years: 0.00    Total pack years: 15.00    Types: Cigarettes, Pipe    Quit date: 08/17/1976    Years since quitting: 46.2   Smokeless tobacco: Never  Vaping Use   Vaping Use: Never used  Substance Use Topics   Alcohol use: Yes    Alcohol/week: 1.0 standard drink of alcohol    Types: 1 Cans of beer per week    Comment: 2-3 beers a week, occasionally/glass of wine 2 times weekly   Drug use: Never     Allergies   Prednisone   Review of Systems Review of Systems  Constitutional:  Negative for chills, fatigue and fever.  HENT:  Negative for sore throat.   Respiratory:  Negative for cough and shortness of breath.   Cardiovascular:  Negative for chest pain and leg swelling.  Gastrointestinal:  Negative for abdominal pain.  Genitourinary:  Negative for dysuria.  Musculoskeletal:  Positive for arthralgias.  Negative for back pain.  Neurological:  Negative for syncope.     Physical Exam Triage Vital Signs ED Triage Vitals  Enc Vitals Group     BP 10/30/22 0925 (!) 157/88     Pulse Rate 10/30/22 0925 73     Resp 10/30/22 0925 18     Temp 10/30/22 0925 97.9 F (36.6 C)  Temp Source 10/30/22 0925 Oral     SpO2 10/30/22 0925 93 %     Weight 10/30/22 0923 220 lb (99.8 kg)     Height --      Head Circumference --      Peak Flow --      Pain Score 10/30/22 0922 5     Pain Loc --      Pain Edu? --      Excl. in [place]? --    No data found.  Updated Vital Signs BP (!) 157/88 (BP Location: Right Arm)   Pulse 73   Temp 97.9 F (36.6 C) (Oral)   Resp 18   Wt 220 lb (99.8 kg)   SpO2 93%   BMI 27.50 kg/m   Visual Acuity Right Eye Distance:   Left Eye Distance:   Bilateral Distance:    Right Eye Near:   Left Eye Near:    Bilateral Near:     Physical Exam Vitals and nursing note reviewed.  Constitutional:      General: He is not in acute distress.    Appearance: He is well-developed.  HENT:     Head: Normocephalic and atraumatic.     Nose: Nose normal.     Mouth/Throat:     Mouth: Mucous membranes are moist.  Eyes:     Conjunctiva/sclera: Conjunctivae normal.  Cardiovascular:     Rate and Rhythm: Normal rate and regular rhythm.     Pulses: Normal pulses.     Heart sounds: Normal heart sounds. No murmur heard. Pulmonary:     Effort: Pulmonary effort is normal. No respiratory distress.     Breath sounds: Normal breath sounds.  Musculoskeletal:        General: No swelling, tenderness, deformity or signs of injury. Normal range of motion.     Cervical back: Normal range of motion and neck supple.     Right lower leg: No edema.     Left lower leg: No edema.  Skin:    General: Skin is warm and dry.     Capillary Refill: Capillary refill takes less than 2 seconds.  Neurological:     Mental Status: He is alert.  Psychiatric:        Mood and Affect: Mood normal.       UC Treatments / Results  Labs (all labs ordered are listed, but only abnormal results are displayed) Labs Reviewed - No data to display  EKG   Radiology No results found.  Procedures Procedures (including critical care time)  Medications Ordered in UC Medications - No data to display  Initial Impression / Assessment and Plan / UC Course  I have reviewed the triage vital signs and the nursing notes.  Pertinent labs & imaging results that were available during my care of the patient were reviewed by me and considered in my medical decision making (see chart for details).  Vitals in triage reviewed, patient is hemodynamically stable.  Room oxygenation at 93%, denies shortness of breath, lungs vesicular posteriorly.  Reports calf discomfort with dorsiflexion.  Without obvious swelling or edema to RLE.  Due to prolonged immobilization yesterday and oxygenation in the lower 90s, was going to send for DVT study, however, this clinic is closed on Good Friday.  Advised to head to the nearest emergency room for further evaluation and treatment.  Patient and wife verbalized understanding, no questions at this time.    Final Clinical Impressions(s) / UC Diagnoses   Final diagnoses:  Right calf pain     Discharge Instructions      Advise you to head to the nearest emergency department for further evaluation.  I was going to send you to our DVT clinic, however, they are closed for Good Friday.  Please return to clinic or seek immediate care if you develop shortness of breath, chest pain, or any changes.      ED Prescriptions   None    PDMP not reviewed this encounter.   Hancel Ion, [place] N, [place] 10/30/22 1000

## 2022-10-30 NOTE — ED Triage Notes (Signed)
Pt came in via POV d/t Rt leg pain that started last night. Came from UC d/t needing further imaging of that leg to rule out blood clot (per pt). A/Ox4, rates pain 5/10, can bear weight/ambulate. Denies swelling or any recent injuries.

## 2022-10-30 NOTE — ED Notes (Signed)
Patient transported to vascular. 

## 2022-10-30 NOTE — Discharge Instructions (Addendum)
You were evaluated here in the emergency department for pain in your right lower leg.  There is no evidence of blood clot seen on studies.  X-rays were obtained and showed no evidence of bony abnormality Please follow-up with your doctor for recheck next week Return here if you are having new or worsening symptoms anytime

## 2022-10-30 NOTE — ED Provider Notes (Addendum)
[facility] Provider Note   CSN: [id number] Arrival date & time: 10/30/22  1008     History  Chief Complaint  Patient presents with   Rt leg pain   Sent from [place] is a 77 y.o. male.  HPI 77 yo male co right calf pain began yesterday No known injury.  Some increased pain with walking.  No weakness, numbness or tingling.      Home Medications Prior to Admission medications   Medication Sig Start Date End Date Taking? Authorizing Provider  Cyanocobalamin (VITAMIN B12 PO) Take by mouth.    [provider]  Dulaglutide (TRULICITY) 3 0000000 SOPN Inject 3 mg as directed once a week. 09/30/22   Tawnya Crook, MD  ezetimibe (ZETIA) 10 MG tablet Take 1 tablet (10 mg total) by mouth daily. 10/29/22   Tawnya Crook, MD  FARXIGA 10 MG TABS tablet TAKE ONE TABLET BY MOUTH DAILY 08/31/22   Luetta Nutting, DO  fenofibrate (TRICOR) 145 MG tablet TAKE ONE TABLET BY MOUTH DAILY 04/09/22   Luetta Nutting, DO  Glucosamine 500 MG CAPS Take by mouth.    [provider]  ibuprofen (ADVIL) 600 MG tablet Take 1 tablet (600 mg total) by mouth every 8 (eight) hours as needed. 01/19/22   Luetta Nutting, DO  Lancets [facility] DELICA PLUS [id number]) MISC USE AS DIRECTED THREE TIMES A DAY 09/17/22   Luetta Nutting, DO  LORazepam (ATIVAN) 0.5 MG tablet TAKE 1 TABLET BY MOUTH DAILY 07/03/22   Luetta Nutting, DO  losartan (COZAAR) 50 MG tablet Take 1 tablet (50 mg total) by mouth daily. 09/30/22   Tawnya Crook, MD  MELATONIN PO Take by mouth.    [provider]  metFORMIN (GLUCOPHAGE-XR) 500 MG 24 hr tablet TAKE TWO TABLETS BY MOUTH TWICE A DAY 02/13/22   Luetta Nutting, DO  montelukast (SINGULAIR) 10 MG tablet Take 1 tablet (10 mg total) by mouth at bedtime. 09/30/22   Tawnya Crook, MD  Multiple Vitamins-Minerals (MULTIVITAMIN PO) Take by mouth.    [provider]  Omega-3 Fatty Acids (FISH OIL PO) Take by  mouth.    [provider]  [facility] ULTRA test strip USE 1 STRIP TO TEST THREE TIMES A DAY 09/17/22   Luetta Nutting, DO  temazepam (RESTORIL) 30 MG capsule TAKE ONE CAPSULE BY MOUTH EVERY NIGHT AT BEDTIME AS NEEDED FOR SLEEP 07/16/22   Luetta Nutting, DO      Allergies    Prednisone    Review of Systems   Review of Systems  Physical Exam Updated Vital Signs BP 117/72   Pulse 81   Temp (!) 96.4 F (35.8 C) (Oral)   Resp 18   Ht 1.88 m (6\' 2" )   Wt 99.8 kg   SpO2 95%   BMI 28.25 kg/m  Physical Exam Vitals and nursing note reviewed.  Constitutional:      Appearance: Luis Fernandez is well-developed.  HENT:     Head: Normocephalic and atraumatic.     Right Ear: External ear normal.     Left Ear: External ear normal.     Nose: Nose normal.     Mouth/Throat:     Mouth: Mucous membranes are moist.     Pharynx: Oropharynx is clear.  Eyes:     Extraocular Movements: Extraocular movements intact.     Conjunctiva/sclera: Conjunctivae normal.     Pupils: Pupils are equal, round, and reactive to  light.  Cardiovascular:     Rate and Rhythm: Normal rate and regular rhythm.     Heart sounds: Normal heart sounds.  Pulmonary:     Effort: Pulmonary effort is normal. No respiratory distress.     Breath sounds: Normal breath sounds. No wheezing.  Chest:     Chest wall: No tenderness.  Abdominal:     General: Bowel sounds are normal. There is no distension.     Palpations: Abdomen is soft. There is no mass.     Tenderness: There is no abdominal tenderness. There is no guarding.  Musculoskeletal:        General: Tenderness present. No swelling. Normal range of motion.     Cervical back: Normal range of motion and neck supple.     Comments: Mild ttp right calf  Skin:    General: Skin is warm and dry.  Neurological:     Mental Status: Luis Fernandez is alert and oriented to person, place, and time.     Motor: No abnormal muscle tone.     Coordination: Coordination normal.     Deep Tendon  Reflexes: Reflexes are normal and symmetric.  Psychiatric:        Behavior: Behavior normal.        Thought Content: Thought content normal.        Judgment: Judgment normal.     ED Results / Procedures / Treatments   Labs (all labs ordered are listed, but only abnormal results are displayed) Labs Reviewed - No data to display  EKG None  Radiology DG Tibia/Fibula Right  Result Date: 10/30/2022 CLINICAL DATA:  Pain right lower leg EXAM: RIGHT TIBIA AND FIBULA - 2 VIEW COMPARISON:  None Available. FINDINGS: No fracture or dislocation is seen. There are no focal lytic lesions. Small bony spurs and chondrocalcinosis are noted in right knee. There is linear calcification at the attachment of Achilles tendon to the calcaneus suggesting calcific bursitis or tendinosis. There is smooth marginated calcification adjacent to the plantar aspect of posterior calcaneus which may be residual from previous ligament injury. There are possible tortuous vessels in subcutaneous plane in the medial aspect of right lower leg. IMPRESSION: No significant acute findings are seen in bony structures in right tibia and fibula. Possible varicose veins are seen in the medial aspect of the right lower leg. Electronically Signed   By: Elmer Picker M.D.   On: 10/30/2022 13:07   VAS [place] LOWER EXTREMITY VENOUS (DVT) (7a-7p)  Result Date: 10/30/2022  Lower Venous DVT Study Patient Name:  Luis Fernandez  Date of Exam:   10/30/2022 Medical Rec #: [id number]         Accession #:    [id number] Date of Birth: Apr 05, 1946         Patient Gender: M Patient Age:   76 years Exam Location:  [facility] Procedure:      VAS [place] LOWER EXTREMITY VENOUS (DVT) Referring Phys: Andee Poles Kattleya Kuhnert --------------------------------------------------------------------------------  Indications: Pain in ankle and calf since last night.  Comparison Study: No prior study. Performing Technologist: McKayla Maag RVT, [place]  Examination Guidelines: A  complete evaluation includes B-mode imaging, spectral Doppler, color Doppler, and power Doppler as needed of all accessible portions of each vessel. Bilateral testing is considered an integral part of a complete examination. Limited examinations for reoccurring indications may be performed as noted. The reflux portion of the exam is performed with the patient in reverse Trendelenburg.  +---------+---------------+---------+-----------+----------+--------------+ RIGHT    CompressibilityPhasicitySpontaneityPropertiesThrombus Aging +---------+---------------+---------+-----------+----------+--------------+ CFV  Full           Yes      Yes                                 +---------+---------------+---------+-----------+----------+--------------+ SFJ      Full                                                        +---------+---------------+---------+-----------+----------+--------------+ FV Prox  Full                                                        +---------+---------------+---------+-----------+----------+--------------+ FV Mid   Full                                                        +---------+---------------+---------+-----------+----------+--------------+ FV DistalFull                                                        +---------+---------------+---------+-----------+----------+--------------+ PFV      Full                                                        +---------+---------------+---------+-----------+----------+--------------+ POP      Full           Yes      Yes                                 +---------+---------------+---------+-----------+----------+--------------+ PTV      Full                                                        +---------+---------------+---------+-----------+----------+--------------+ PERO     Full                                                         +---------+---------------+---------+-----------+----------+--------------+   +----+---------------+---------+-----------+----------+--------------+ LEFTCompressibilityPhasicitySpontaneityPropertiesThrombus Aging +----+---------------+---------+-----------+----------+--------------+ CFV Full           Yes      Yes                                 +----+---------------+---------+-----------+----------+--------------+ SFJ Full                                                        +----+---------------+---------+-----------+----------+--------------+  Summary: RIGHT: - There is no evidence of deep vein thrombosis in the lower extremity.  - No cystic structure found in the popliteal fossa.  LEFT: - No evidence of common femoral vein obstruction.  *See table(s) above for measurements and observations.    Preliminary     Procedures Procedures    Medications Ordered in ED Medications - No data to display  ED Course/ Medical Decision Making/ A&P Clinical Course as of 10/30/22 1317  Fri Oct 30, 2022  1314 Tib-fib x-Sherrice [place] reviewed interpreted negative care malunited  [DR]    Clinical Course User Index [DR] Pattricia Boss, MD                             Medical Decision Making Amount and/or Complexity of Data Reviewed Radiology: ordered.   X-Tracye Szuch and doppler study normal        Final Clinical Impression(s) / ED Diagnoses Final diagnoses:  Right calf pain    Rx / DC Orders ED Discharge Orders     None         Pattricia Boss, MD 10/30/22 1316    Pattricia Boss, MD 10/30/22 1317

## 2022-10-30 NOTE — ED Triage Notes (Signed)
Pt states that leg started hurting last night. Worried about a clot. Pain is radiating up leg. Pain feels something like a cramp. Pt states that he got up out of his chair and ankle felt like it was going to give away. Take IBU on th regular.

## 2022-11-02 ENCOUNTER — Telehealth: Payer: Self-pay

## 2022-11-02 NOTE — Transitions of Care (Post Inpatient/ED Visit) (Signed)
   11/02/2022  Name: Luis Fernandez MRN: [id number] DOB: [birth year]  Today's TOC FU Call Status: Today's TOC FU Call Status:: Successful TOC FU Call Competed TOC FU Call Complete Date: 11/02/22  Transition Care Management Follow-up Telephone Call Date of Discharge: 10/30/22 Discharge Facility: [facility]) Type of Discharge: Emergency Department Reason for ED Visit: Other: ('right calf pain") How have you been since you were released from the hospital?: Better (Pt states when he left ED- applied ice/heat therapy to leg a few times & since then pain gone away. He thinks he may have over done it with physical activity/yardwork a few days prior and likely cause. He is out working in yard at present and doing well.) Any questions or concerns?: No  Items Reviewed: Did you receive and understand the discharge instructions provided?: No Medications obtained and verified?: Yes (Medications Reviewed) Any new allergies since your discharge?: No Dietary orders reviewed?: NA Do you have support at home?: Yes People in Home: spouse Name of Support/Comfort Primary Source: [facility] and Equipment/Supplies: [place] Ordered?: NA Any new equipment or medical supplies ordered?: NA  Functional Questionnaire: Do you need assistance with bathing/showering or dressing?: No Do you need assistance with meal preparation?: No Do you need assistance with eating?: No Do you have difficulty maintaining continence: No Do you need assistance with getting out of bed/getting out of a chair/moving?: No Do you have difficulty managing or taking your medications?: No  Follow up appointments reviewed: PCP Follow-up appointment confirmed?: Yes Date of PCP follow-up appointment?: 11/09/22 Follow-up Provider: Dr. Cherlynn Kaiser Specialist [facility] Follow-up appointment confirmed?: Yes Date of Specialist follow-up appointment?: 11/09/22 Follow-Up Specialty Provider:: Dr. Harrell Gave Do you need  transportation to your follow-up appointment?: No Do you understand care options if your condition(s) worsen?: Yes-patient verbalized understanding  SDOH Interventions Today    Flowsheet Row Most Recent Value  SDOH Interventions   Food Insecurity Interventions Intervention Not Indicated  Transportation Interventions Intervention Not Indicated      TOC Interventions Today    Flowsheet Row Most Recent Value  TOC Interventions   TOC Interventions Discussed/Reviewed TOC Interventions Discussed      Interventions Today    Flowsheet Row Most Recent Value  General Interventions   General Interventions Discussed/Reviewed General Interventions Discussed, Doctor Visits  Doctor Visits Discussed/Reviewed Doctor Visits Discussed  PCP/Specialist Visits Compliance with follow-up visit  Pharmacy Interventions   Pharmacy Dicussed/Reviewed Pharmacy Topics Discussed, Medications and their functions  Safety Interventions   Safety Discussed/Reviewed Safety Discussed, Home Safety       Hetty Blend [facility] Care Management Care Management Community Coordinator Direct Phone: [phone] Toll Free: [phone] Fax: [phone]

## 2022-11-06 ENCOUNTER — Other Ambulatory Visit: Payer: Self-pay | Admitting: Family Medicine

## 2022-11-09 ENCOUNTER — Ambulatory Visit (INDEPENDENT_AMBULATORY_CARE_PROVIDER_SITE_OTHER): Payer: Medicare Other | Admitting: Cardiology

## 2022-11-09 ENCOUNTER — Ambulatory Visit (INDEPENDENT_AMBULATORY_CARE_PROVIDER_SITE_OTHER): Payer: Medicare Other | Admitting: Family Medicine

## 2022-11-09 ENCOUNTER — Encounter: Payer: Self-pay | Admitting: Family Medicine

## 2022-11-09 ENCOUNTER — Encounter (HOSPITAL_BASED_OUTPATIENT_CLINIC_OR_DEPARTMENT_OTHER): Payer: Self-pay | Admitting: Cardiology

## 2022-11-09 VITALS — BP 112/60 | HR 82 | Temp 97.4°F | Ht 75.0 in | Wt 219.4 lb

## 2022-11-09 VITALS — BP 126/70 | HR 85 | Ht 72.0 in | Wt 219.0 lb

## 2022-11-09 DIAGNOSIS — Z9189 Other specified personal risk factors, not elsewhere classified: Secondary | ICD-10-CM

## 2022-11-09 DIAGNOSIS — E1159 Type 2 diabetes mellitus with other circulatory complications: Secondary | ICD-10-CM | POA: Diagnosis not present

## 2022-11-09 DIAGNOSIS — T466X5A Adverse effect of antihyperlipidemic and antiarteriosclerotic drugs, initial encounter: Secondary | ICD-10-CM | POA: Diagnosis not present

## 2022-11-09 DIAGNOSIS — M79661 Pain in right lower leg: Secondary | ICD-10-CM | POA: Diagnosis not present

## 2022-11-09 DIAGNOSIS — Z7189 Other specified counseling: Secondary | ICD-10-CM

## 2022-11-09 DIAGNOSIS — I152 Hypertension secondary to endocrine disorders: Secondary | ICD-10-CM

## 2022-11-09 DIAGNOSIS — R9431 Abnormal electrocardiogram [ECG] [EKG]: Secondary | ICD-10-CM | POA: Diagnosis not present

## 2022-11-09 DIAGNOSIS — E782 Mixed hyperlipidemia: Secondary | ICD-10-CM

## 2022-11-09 DIAGNOSIS — G4733 Obstructive sleep apnea (adult) (pediatric): Secondary | ICD-10-CM

## 2022-11-09 DIAGNOSIS — M791 Myalgia, unspecified site: Secondary | ICD-10-CM | POA: Diagnosis not present

## 2022-11-09 NOTE — Progress Notes (Signed)
Cardiology Office Note:    Date:  11/09/2022   ID:  Luis Fernandez, DOB 1946-03-24, MRN [id number]  PCP:  Luis Sow, MD  Cardiologist:  Luis Red, MD  Referring MD: Luis Sow, MD   CC:  New patient evaluation for abnormal EKG  History of Present Illness:    Luis Fernandez is a 77 y.o. male with a hx of hypertension, hyperlipidemia, diabetes, arthritis, and OSA on CPAP, who is seen as a new consult at the request of Luis Sow, MD for the evaluation and management of abnormal EKG.  He was seen by Dr. Ruthine Fernandez 09/30/2022. His EKG was noted to be abnormal, showing sinus rhythm, incomplete RBBB, and old inferior infarct. He was generally asymptomatic. No prior EKG's available. He was referred to cardiology for further evaluation.  On 10/30/2022 he presented to urgent care with complaints of right calf pain. He was sent to the ED for DVT study which was negative.  Cardiovascular risk factors: Prior clinical ASCVD:  None. Comorbid conditions: Hypertension- On losartan. Has had hypertension for at least 15 years. Hyperlipidemia- On fenofibrate, and fish oil. He is not taking pravastatin; it was working until recently when he developed nocturnal LE muscle cramps. This resolved with stopping pravastatin. Has had high triglycerides for a long time which he believes is genetic. In 10/2022 his LDL was 76. Diabetes- Also for about 15 years with secondary peripheral neuropathy. On Trulicity, Farxiga, and metformin. A1c of 6.5 on 07/22/22.  Additionally he endorses varicose veins. Metabolic syndrome/Obesity: He notes that his weight is down significantly from his peak. Currently he weighs 219 lbs.  Chronic inflammatory conditions: None. Tobacco use history:  He is a former smoker. He quit in 1978 (15 pack years). Family history:  His father also had varicose veins; he had Parkinson's for 20+ years. His mother had hypertension; she died of a heart attack in her sleep when she  was 65 or 78 yo. Prior cardiac testing and/or incidental findings on other testing (ie coronary calcium): He reports having a stress test when he was in [place]. Exercise level:  Enjoys gardening, hand-mowing half an acre. No stairs at home since moving to the area. He endorses some shortness of breath, which he attributes to age-related deconditioning. However he denies feeling limited due to his shortness of breath. He has not been walking as much as he used to; he would like to increase his exercise from walking 1 mile to walking 2-3 miles a day.  Current diet: Not discussed.  He does have sleep apnea. He has used a CPAP religiously for 15+ years.  He denies any palpitations, chest pain, or peripheral edema. No lightheadedness, headaches, syncope, orthopnea, or PND.  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   NOSE SURGERY     NOSE SURGERY  bone spurs   TOTAL KNEE ARTHROPLASTY     UMBILICAL HERNIA REPAIR      Current Medications: Current Outpatient Medications on File Prior to Visit  Medication Sig   Cyanocobalamin (VITAMIN B12 PO) Take by mouth.   Dulaglutide (TRULICITY) 3 MG/0.5ML SOPN Inject 3 mg as directed once a week.   ezetimibe (ZETIA) 10 MG tablet Take 1 tablet (10 mg total) by mouth daily.   FARXIGA 10 MG TABS tablet TAKE ONE TABLET BY MOUTH DAILY   fenofibrate (TRICOR) 145 MG tablet TAKE ONE TABLET BY  MOUTH DAILY   Glucosamine 500 MG CAPS Take by mouth.   ibuprofen (ADVIL) 600 MG tablet Take 1 tablet (600 mg total) by mouth every 8 (eight) hours as needed.   Lancets (ONETOUCH DELICA PLUS LANCET33G) MISC USE AS DIRECTED THREE TIMES A DAY   LORazepam (ATIVAN) 0.5 MG tablet TAKE 1 TABLET BY MOUTH DAILY   losartan (COZAAR) 50 MG tablet Take 1 tablet (50 mg total) by mouth daily.   MELATONIN PO Take by mouth.   metFORMIN (GLUCOPHAGE-XR) 500 MG 24 hr tablet TAKE TWO TABLETS BY MOUTH TWICE A DAY   montelukast (SINGULAIR) 10 MG tablet Take 1 tablet (10 mg total) by mouth at bedtime.   Multiple Vitamins-Minerals (MULTIVITAMIN PO) Take by mouth.   Omega-3 Fatty Acids (FISH OIL PO) Take by mouth.   ONETOUCH ULTRA test strip USE 1 STRIP TO TEST THREE TIMES A DAY   temazepam (RESTORIL) 30 MG capsule TAKE ONE CAPSULE BY MOUTH EVERY NIGHT AT BEDTIME AS NEEDED FOR SLEEP   No current facility-administered medications on file prior to visit.     Allergies:   Prednisone   Social History   Tobacco Use   Smoking status: Former    Packs/day: 1.50    Years: 10.00    Additional pack years: 0.00    Total pack years: 15.00    Types: Cigarettes, Pipe    Quit date: 08/17/1976    Years since quitting: 46.2   Smokeless tobacco: Never  Vaping Use   Vaping Use: Never used  Substance Use Topics   Alcohol use: Yes    Alcohol/week: 1.0 standard drink of alcohol    Types: 1 Cans of beer per week    Comment: 2-3 beers a week, occasionally/glass of wine 2 times weekly   Drug use: Never    Family History: family history includes COPD in his brother; Dementia in his brother; Depression in his brother; Heart disease in his mother; Hypertension in his brother and mother; Parkinson's disease in his father. There is no history of Colon cancer, Stomach cancer, Rectal cancer, Pancreatic cancer, or Esophageal cancer.  ROS:   Please see the history of present illness.  Additional pertinent ROS: Constitutional:  Negative for chills, fever, night sweats, unintentional weight loss  HENT: Negative for ear pain and hearing loss.   Eyes: Negative for loss of vision and eye pain.  Respiratory: Negative for cough, sputum, wheezing. Positive for shortness of breath. Cardiovascular: See HPI. Gastrointestinal: Negative for abdominal pain, melena, and hematochezia.  Genitourinary: Negative for dysuria and hematuria.  Musculoskeletal: Negative for falls.  Skin: Negative for itching and rash.  Neurological: Negative for focal weakness, focal sensory changes and loss of consciousness.  Endo/Heme/Allergies: Does not bruise/bleed easily.     EKGs/Labs/Other Studies Reviewed:    The following studies were reviewed today:  LE Venous Doppler  10/30/2022: Summary:  RIGHT:  - There is no evidence of deep vein thrombosis in  the lower extremity.    - No cystic structure found in the popliteal fossa.    LEFT:  - No evidence of common femoral vein obstruction.    EKG:  EKG is personally reviewed.   11/09/2022:  NSR at 85 bpm, RBBB, Q in III  Recent Labs: 09/30/2022: ALT 29; BUN 20; Creatinine, Ser 0.81; Hemoglobin 15.7; Platelets 269.0; Potassium 4.0; Sodium 142; TSH 0.61   Recent Lipid Panel    Component Value Date/Time   CHOL 130 09/30/2022 1531   TRIG 284.0 (H) 09/30/2022 1531   HDL 31.40 (L) 09/30/2022 1531   CHOLHDL 4 09/30/2022 1531   VLDL 56.8 (H) 09/30/2022 1531   LDLCALC 94 01/19/2022 0000   LDLDIRECT 76.0 09/30/2022 1531    Physical Exam:    VS:  BP 126/70 (BP Location: Left Arm, Patient Position: Sitting, Cuff Size: Normal)   Pulse 85   Ht 6' (1.829 m)   Wt 219 lb (99.3 kg)   BMI 29.70 kg/m     Wt Readings from Last 3 Encounters:  11/09/22 219 lb (99.3 kg)  11/09/22 219 lb 6 oz (99.5 kg)  10/30/22 220 lb (99.8 kg)    GEN: Well nourished, well developed in no acute distress HEENT: Normal, moist mucous membranes NECK: No JVD CARDIAC: regular rhythm, normal S1 and S2, no rubs or  gallops. No murmur. VASCULAR: Radial and DP pulses 2+ bilaterally. No carotid bruits RESPIRATORY:  Clear to auscultation without rales, wheezing or rhonchi  ABDOMEN: Soft, non-tender, non-distended MUSCULOSKELETAL:  Ambulates independently SKIN: Warm and dry, no edema NEUROLOGIC:  Alert and oriented x 3. No focal neuro deficits noted. PSYCHIATRIC:  Normal affect    ASSESSMENT:    1. Abnormal ECG   2. Hypertension associated with diabetes   3. Mixed hyperlipidemia   4. Myalgia due to statin   5. OSA on CPAP   6. Cardiac risk counseling   7. Counseling on health promotion and disease prevention   8. At increased risk for cardiovascular disease    PLAN:    Abnormal ECG -RBBB, asymptomatic -no further evaluation needed at this time  Hypertension Type II diabetes Mixed hyperlipidemia Statin myalgia High ASCVD risk score -discussed today. Recommendations are for aspirin and statin. However, he reports reaction to prior statin and most recently myalgia on pravastatin -tolerating aspirin -on zetia and fish oil. Would prefer statin given his high ASCVD risk score, declines. No prior known ASCVD to get PCSK9i -on GLP1, SGLT2i for diabetes, in addition to metformin -on losartan for BP control  Cardiac risk counseling and prevention recommendations: -recommend heart healthy/Mediterranean diet, with whole grains, fruits, vegetable, fish, lean meats, nuts, and olive oil. Limit salt. -recommend moderate walking, 3-5 times/week for 30-50 minutes each session. Aim for at least 150 minutes.week. Goal should be pace of 3 miles/hours, or walking 1.5 miles in 30 minutes -recommend avoidance of tobacco products. Avoid excess alcohol. -ASCVD risk score: The 10-year ASCVD risk score (Arnett DK, et al., 2019) is: 51.9%   Values used to calculate the score:     Age: [age] years     Sex: Male     Is Non-Hispanic African American: No     Diabetic: Yes     Tobacco smoker: No     Systolic Blood  Pressure: 126 mmHg     Is BP treated: Yes     HDL Cholesterol: 31.4 mg/dL     Total Cholesterol: 130 mg/dL    Plan for follow up: As needed.  Luis Red, MD, PhD, [facility] [place]  [facility]    Medication Adjustments/Labs and Tests Ordered: Current medicines are reviewed at length with the patient today.  Concerns regarding medicines are outlined above.   Orders Placed This Encounter  Procedures   EKG 12-Lead   No orders of the defined types were placed in this encounter.  Patient Instructions  Medication Instructions:  Your physician recommends that you continue on your current medications as directed. Please refer to the Current Medication list given to you today.   Labwork: NONE  Testing/Procedures: NONE  Follow-Up: AS NEEDED     I,Mathew Stumpf,acting as a Neurosurgeon for [employer], MD.,have documented all relevant documentation on the behalf of Luis Red, MD,as directed by  Luis Red, MD while in the presence of Luis Red, MD.  I, Luis Red, MD, have reviewed all documentation for this visit. The documentation on 11/22/22 for the exam, diagnosis, procedures, and orders are all accurate and complete.   Signed, Luis Red, MD PhD 11/09/2022     [facility]

## 2022-11-09 NOTE — Patient Instructions (Signed)
Medication Instructions:  ?Your physician recommends that you continue on your current medications as directed. Please refer to the Current Medication list given to you today.  ? ?Labwork: ?NONE ? ?Testing/Procedures: ?NONE ? ?Follow-Up: ?AS NEEDED  ? ?  ?

## 2022-11-09 NOTE — Patient Instructions (Signed)
Stay active.

## 2022-11-09 NOTE — Progress Notes (Signed)
Subjective:     Patient ID: Luis Fernandez, male    DOB: [birth year], 77 y.o.   MRN: [id number]  Chief Complaint  Patient presents with   Follow-up    UC and ED follow-up for right leg pain, pain is okay, heat and ice worked     HPI  Right calf pain-went to urgent care and ER-DVT ruled out.  X-ray negative.  Started after walking dog.  Had been working in yard 2 days prior.  No injury. Could barely walk.  Ice/heat-Doing well.  HLD-was getting cramps on pravastatin(also several others did same)-better when stopped. On zetia.    There are no preventive care reminders to display for this patient.  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   NOSE SURGERY     NOSE SURGERY     bone spurs   TOTAL KNEE ARTHROPLASTY     UMBILICAL HERNIA REPAIR      Outpatient Medications Prior to Visit  Medication Sig Dispense Refill   aspirin EC 81 MG tablet Take 81 mg by mouth daily. Swallow whole.     Cyanocobalamin (VITAMIN B12 PO) Take by mouth.     Dulaglutide (TRULICITY) 3 MG/0.5ML SOPN Inject 3 mg as directed once a week. 6 mL 1   ezetimibe (ZETIA) 10 MG tablet Take 1 tablet (10 mg total) by mouth daily. 90 tablet 3   FARXIGA 10 MG TABS tablet TAKE ONE TABLET BY MOUTH DAILY 90 tablet 2   fenofibrate (TRICOR) 145 MG tablet TAKE ONE TABLET BY MOUTH DAILY 90 tablet 2   Glucosamine 500 MG CAPS Take by mouth.     ibuprofen  (ADVIL) 600 MG tablet Take 1 tablet (600 mg total) by mouth every 8 (eight) hours as needed. 270 tablet 3   Lancets (ONETOUCH DELICA PLUS LANCET33G) MISC USE AS DIRECTED THREE TIMES A DAY 300 each PRN   LORazepam (ATIVAN) 0.5 MG tablet TAKE 1 TABLET BY MOUTH DAILY 30 tablet 1   losartan (COZAAR) 50 MG tablet Take 1 tablet (50 mg total) by mouth daily. 90 tablet 1   MELATONIN PO Take by mouth.     metFORMIN (GLUCOPHAGE-XR) 500 MG 24 hr tablet TAKE TWO TABLETS BY MOUTH TWICE A DAY 360 tablet 3   montelukast (SINGULAIR) 10 MG tablet Take 1 tablet (10 mg total) by mouth at bedtime. 90 tablet 2   Multiple Vitamins-Minerals (MULTIVITAMIN PO) Take by mouth.     Omega-3 Fatty Acids (FISH OIL PO) Take by mouth.     ONETOUCH ULTRA test strip USE 1 STRIP TO TEST THREE TIMES A DAY 250 strip 1   temazepam (RESTORIL) 30 MG capsule TAKE ONE CAPSULE BY MOUTH EVERY NIGHT AT BEDTIME AS NEEDED FOR SLEEP 30 capsule 5   No facility-administered medications prior to visit.  Allergies  Allergen Reactions   Prednisone     Intolerance    ROS neg/noncontributory except as noted HPI/below Blood pressure's doing well.       Objective:     BP 112/60   Pulse 82   Temp (!) 97.4 F (36.3 C) (Temporal)   Ht 6\' 3"  (1.905 m)   Wt 219 lb 6 oz (99.5 kg)   SpO2 97%   BMI 27.42 kg/m  Wt Readings from Last 3 Encounters:  11/09/22 219 lb 6 oz (99.5 kg)  10/30/22 220 lb (99.8 kg)  10/30/22 220 lb (99.8 kg)    Physical Exam   Gen: WDWN NAD HEENT: NCAT, conjunctiva not injected, sclera nonicteric NECK:  supple, no thyromegaly, no nodes, no carotid bruits CARDIAC: RRR, S1S2+, no murmur. DP 1+B LUNGS: CTAB. No wheezes EXT:  no edema. + large varicose veins. No calf TTP MSK: no gross abnormalities.  NEURO: A&O x3.  CN II-XII intact.  PSYCH: normal mood. Good eye contact     Assessment & Plan:   Problem List Items Addressed This Visit   None Visit Diagnoses     Right calf pain    -  Primary       Right calf pain-ruled out for DVT/injury.  Resolved.  Discussed travel, compression stockings, etc.  Has a lot of varicosities-not bothersome.    Reviewed ER notes  No orders of the defined types were placed in this encounter.   Angelena Sole, MD

## 2022-12-19 ENCOUNTER — Other Ambulatory Visit: Payer: Self-pay | Admitting: Family Medicine

## 2023-01-01 ENCOUNTER — Other Ambulatory Visit: Payer: Self-pay | Admitting: Family Medicine

## 2023-01-05 ENCOUNTER — Other Ambulatory Visit: Payer: Self-pay | Admitting: Family Medicine

## 2023-01-05 MED ORDER — FENOFIBRATE 145 MG PO TABS
145.0000 mg | ORAL_TABLET | Freq: Every day | ORAL | 3 refills | Status: DC
  Filled 2023-05-01: qty 90, 90d supply, fill #0
  Filled 2023-08-12: qty 90, 90d supply, fill #1

## 2023-01-07 ENCOUNTER — Other Ambulatory Visit: Payer: Self-pay | Admitting: Family Medicine

## 2023-01-07 MED ORDER — TEMAZEPAM 30 MG PO CAPS: 30.0000 mg | ORAL_CAPSULE | Freq: Every day | ORAL | 2 refills | Status: DC

## 2023-01-07 MED ORDER — LORAZEPAM 0.5 MG PO TABS: 0.5000 mg | ORAL_TABLET | Freq: Every day | ORAL | 2 refills | Status: DC

## 2023-01-07 NOTE — Telephone Encounter (Signed)
Patient states pharmacy still sends Dr. Ashley Royalty, former PCP, med refill requests despite informing them of change. States pharmacy needs current PCP to send rx order for meds below.  Prescription Request  01/07/2023  LOV: 07/22/2022  What is the name of the medication or equipment? LORazepam (ATIVAN) 0.5 MG tablet  temazepam (RESTORIL) 30 MG capsule   Have you contacted your pharmacy to request a refill? Yes   Which pharmacy would you like this sent to?  [id number] [place], [place] - [address] [place] [place] [address] Phone: [phone] Fax: [phone]    Patient notified that their request is being sent to the clinical staff for review and that they should receive a response within 2 business days.   Please advise at Mobile [phone] (mobile)

## 2023-02-13 ENCOUNTER — Encounter: Payer: Self-pay | Admitting: Family Medicine

## 2023-02-15 ENCOUNTER — Other Ambulatory Visit: Payer: Self-pay | Admitting: Family Medicine

## 2023-02-15 MED ORDER — METFORMIN HCL ER 500 MG PO TB24
1000.0000 mg | ORAL_TABLET | Freq: Two times a day (BID) | ORAL | 1 refills | Status: DC
  Filled 2023-04-30: qty 360, 90d supply, fill #0

## 2023-03-03 ENCOUNTER — Encounter (INDEPENDENT_AMBULATORY_CARE_PROVIDER_SITE_OTHER): Payer: Self-pay

## 2023-03-15 ENCOUNTER — Encounter: Payer: Self-pay | Admitting: Family Medicine

## 2023-03-16 ENCOUNTER — Other Ambulatory Visit: Payer: Self-pay | Admitting: *Deleted

## 2023-03-16 ENCOUNTER — Telehealth: Payer: Self-pay | Admitting: Family Medicine

## 2023-03-16 DIAGNOSIS — E1142 Type 2 diabetes mellitus with diabetic polyneuropathy: Secondary | ICD-10-CM

## 2023-03-16 MED ORDER — ONETOUCH DELICA PLUS LANCET33G MISC: 99 refills | Status: DC

## 2023-03-16 MED ORDER — ONETOUCH ULTRA VI STRP: ORAL_STRIP | 1 refills | Status: DC

## 2023-03-16 MED ORDER — ONETOUCH ULTRA VI STRP
ORAL_STRIP | 1 refills | Status: DC
  Filled 2023-04-30: qty 250, 83d supply, fill #0

## 2023-03-16 NOTE — Telephone Encounter (Signed)
Rx's sent to the pharmacy. 

## 2023-03-16 NOTE — Addendum Note (Signed)
Addended by: Jobe Gibbon on: 03/16/2023 03:28 PM   Modules accepted: Orders

## 2023-03-16 NOTE — Telephone Encounter (Signed)
Ladona Ridgel from [facility] pharmacy states they need the diagnosis codes for the lancet and test strips that were sent in today. States this is needed due to patient having medicare. Caller also stated a frequency is need on the test strips.

## 2023-04-01 ENCOUNTER — Ambulatory Visit (INDEPENDENT_AMBULATORY_CARE_PROVIDER_SITE_OTHER): Payer: Medicare Other | Admitting: Family Medicine

## 2023-04-01 ENCOUNTER — Encounter: Payer: Self-pay | Admitting: Family Medicine

## 2023-04-01 VITALS — BP 136/82 | HR 77 | Temp 98.2°F | Resp 18 | Ht 75.0 in | Wt 221.5 lb

## 2023-04-01 DIAGNOSIS — G720 Drug-induced myopathy: Secondary | ICD-10-CM

## 2023-04-01 DIAGNOSIS — E1142 Type 2 diabetes mellitus with diabetic polyneuropathy: Secondary | ICD-10-CM | POA: Diagnosis not present

## 2023-04-01 DIAGNOSIS — Z7985 Long-term (current) use of injectable non-insulin antidiabetic drugs: Secondary | ICD-10-CM

## 2023-04-01 DIAGNOSIS — E1159 Type 2 diabetes mellitus with other circulatory complications: Secondary | ICD-10-CM

## 2023-04-01 DIAGNOSIS — E538 Deficiency of other specified B group vitamins: Secondary | ICD-10-CM | POA: Diagnosis not present

## 2023-04-01 DIAGNOSIS — E785 Hyperlipidemia, unspecified: Secondary | ICD-10-CM

## 2023-04-01 DIAGNOSIS — F5101 Primary insomnia: Secondary | ICD-10-CM | POA: Diagnosis not present

## 2023-04-01 DIAGNOSIS — I739 Peripheral vascular disease, unspecified: Secondary | ICD-10-CM

## 2023-04-01 DIAGNOSIS — T466X5A Adverse effect of antihyperlipidemic and antiarteriosclerotic drugs, initial encounter: Secondary | ICD-10-CM

## 2023-04-01 DIAGNOSIS — I152 Hypertension secondary to endocrine disorders: Secondary | ICD-10-CM

## 2023-04-01 DIAGNOSIS — Z7984 Long term (current) use of oral hypoglycemic drugs: Secondary | ICD-10-CM | POA: Diagnosis not present

## 2023-04-01 LAB — MICROALBUMIN / CREATININE URINE RATIO
Creatinine,U: 78 mg/dL
Microalb Creat Ratio: 0.9 mg/g (ref 0.0–30.0)
Microalb, Ur: <0.7 mg/dL (ref 0.0–1.9)

## 2023-04-01 LAB — COMPREHENSIVE METABOLIC PANEL
ALT: 31 U/L (ref 0–53)
AST: 27 U/L (ref 0–37)
Albumin: 4.5 g/dL (ref 3.5–5.2)
Alkaline Phosphatase: 37 U/L — ABNORMAL LOW (ref 39–117)
BUN: 22 mg/dL (ref 6–23)
CO2: 26 mEq/L (ref 19–32)
Calcium: 9.9 mg/dL (ref 8.4–10.5)
Chloride: 107 mEq/L (ref 96–112)
Creatinine, Ser: 0.86 mg/dL (ref 0.40–1.50)
GFR: 83.11 mL/min (ref >60.00)
Glucose, Bld: 114 mg/dL — ABNORMAL HIGH (ref 70–99)
Potassium: 4.2 mEq/L (ref 3.5–5.1)
Sodium: 141 mEq/L (ref 135–145)
Total Bilirubin: 0.8 mg/dL (ref 0.2–1.2)
Total Protein: 7 g/dL (ref 6.0–8.3)

## 2023-04-01 LAB — HEMOGLOBIN A1C: Hgb A1c MFr Bld: 6.6 % — ABNORMAL HIGH (ref 4.6–6.5)

## 2023-04-01 LAB — LIPID PANEL
Cholesterol: 169 mg/dL (ref 0–200)
HDL: 33.7 mg/dL — ABNORMAL LOW (ref >39.00)
NonHDL: 135.78
Total CHOL/HDL Ratio: 5
Triglycerides: 258 mg/dL — ABNORMAL HIGH (ref 0.0–149.0)
VLDL: 51.6 mg/dL — ABNORMAL HIGH (ref 0.0–40.0)

## 2023-04-01 LAB — VITAMIN B12: Vitamin B-12: >1501 pg/mL — ABNORMAL HIGH (ref 211–911)

## 2023-04-01 LAB — LDL CHOLESTEROL, DIRECT: Direct LDL: 129 mg/dL

## 2023-04-01 MED ORDER — TRULICITY 3 MG/0.5ML ~~LOC~~ SOAJ
3.0000 mg | SUBCUTANEOUS | 1 refills | Status: DC
  Filled 2023-05-20: qty 2, 28d supply, fill #0
  Filled 2023-06-19: qty 2, 28d supply, fill #1
  Filled 2023-07-18: qty 2, 28d supply, fill #2
  Filled 2023-08-12: qty 2, 28d supply, fill #3
  Filled 2023-09-09 – 2023-09-10 (×2): qty 2, 28d supply, fill #4

## 2023-04-01 MED ORDER — DAPAGLIFLOZIN PROPANEDIOL 10 MG PO TABS
10.0000 mg | ORAL_TABLET | Freq: Every day | ORAL | 3 refills | Status: DC
  Filled 2023-05-01 – 2023-06-05 (×2): qty 90, 90d supply, fill #0
  Filled 2023-09-03 (×2): qty 90, 90d supply, fill #1
  Filled 2023-11-29: qty 90, 90d supply, fill #2
  Filled 2023-12-03: qty 30, 30d supply, fill #2
  Filled 2024-02-02 – 2024-02-03 (×2): qty 30, 30d supply, fill #3
  Filled 2024-03-14 (×2): qty 30, 30d supply, fill #4
  Filled ????-??-??: fill #2

## 2023-04-01 MED ORDER — IBUPROFEN 600 MG PO TABS
600.0000 mg | ORAL_TABLET | Freq: Three times a day (TID) | ORAL | 3 refills | Status: DC | PRN
  Filled 2023-05-01: qty 90, 30d supply, fill #0
  Filled 2023-07-31 (×2): qty 270, 90d supply, fill #0
  Filled 2023-12-21: qty 270, 90d supply, fill #1

## 2023-04-01 MED ORDER — TEMAZEPAM 30 MG PO CAPS
30.0000 mg | ORAL_CAPSULE | Freq: Every day | ORAL | 2 refills | Status: DC
  Filled 2023-05-06: qty 30, 30d supply, fill #0
  Filled 2023-06-03: qty 15, 25d supply, fill #1
  Filled 2023-06-07: qty 15, 15d supply, fill #2

## 2023-04-01 MED ORDER — CEPHALEXIN 500 MG PO CAPS: 500.0000 mg | ORAL_CAPSULE | Freq: Once | ORAL | 4 refills | Status: DC

## 2023-04-01 NOTE — Patient Instructions (Signed)
It was very nice to see you today!  [facility].   PLEASE NOTE:  If you had any lab tests please let us know if you have not heard back within a few days. You may see your results on MyChart before we have a chance to review them but we will give you a call once they are reviewed by [place]. If we ordered any referrals today, please let us know if you have not heard from their office within the next week.   Please try these tips to maintain a healthy lifestyle:  Eat most of your calories during the day when you are active. Eliminate processed foods including packaged sweets (pies, cakes, cookies), reduce intake of potatoes, white bread, white pasta, and white rice. Look for whole grain options, oat flour or almond flour.  Each meal should contain half fruits/vegetables, one quarter protein, and one quarter carbs (no bigger than a computer mouse).  Cut down on sweet beverages. This includes juice, soda, and sweet tea. Also watch fruit intake, though this is a healthier sweet option, it still contains natural sugar! Limit to 3 servings daily.  Drink at least 1 glass of water with each meal and aim for at least 8 glasses per day  Exercise at least 150 minutes every week.

## 2023-04-01 NOTE — Progress Notes (Signed)
Subjective:     Patient ID: Luis Fernandez, male    DOB: 1945-11-26, 77 y.o.   MRN: [id number]  Chief Complaint  Patient presents with   Medical Management of Chronic Issues    6 month follow-up on dm, htn Fasting Need new rx for Keflex, takes 4 tablets before dental work and ibuprofen    HPI  DM - His diabetes is well controlled on farxiga 10mg , trulicity 3mg , metformin 1000mg  bid. He monitors his blood sugars at home, usually 120-130. Tends to stay under 200s. He has been walking a couple of times a day, daily. Also does yard work to stay active.   HTN - Pt is on losartan 50mg . At initial check his Bp was elevated at 140/80, which he attributes to white coat. When rechecked, his Bp improved to 136/82. Bp's well controlled at home, reports a reading of 120s/90s this morning. Occasionally his bp will be as low as 100/60s. No ha/dizziness/cp/palp/edema/cough/sob.  Muscle cramps  - Taking lorazepam 0.5 mg as needed to manage muscle cramps.   Insomnia - Takes restoril 30 mg daily at nighttime. Sleeps well.  No SI.  Doesn't want to change  Falls - He reports he occasionally falls, usually due to falling over small steps in his house.   Keflex - Started taking after his knee replacement, to avoid infection. Usually takes 4 tablets of keflex and ibuprofen prior to any dental work.   He reports a hx of PAD. Taking ASA and zetia 10mg .  No symptoms.  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   NOSE SURGERY     NOSE SURGERY     bone spurs   TOTAL KNEE ARTHROPLASTY     UMBILICAL HERNIA REPAIR       Current Outpatient Medications:    aspirin EC 81 MG tablet, Take 81 mg by mouth daily. Swallow whole., Disp: , Rfl:    Cyanocobalamin (VITAMIN B12 PO), Take by mouth., Disp: , Rfl:    ezetimibe (ZETIA) 10 MG tablet, Take 1 tablet (10 mg total) by mouth daily., Disp: 90 tablet, Rfl: 3   fenofibrate (TRICOR) 145 MG tablet, Take 1 tablet (145 mg total) by mouth daily., Disp: 90 tablet, Rfl: 3   Glucosamine 500 MG CAPS, Take by mouth., Disp: , Rfl:    glucose blood (ONETOUCH ULTRA) test strip, Use to test blood sugar three times daily, Disp: 250 strip, Rfl: 1   Lancets (ONETOUCH DELICA PLUS LANCET33G) MISC, USE AS DIRECTED THREE TIMES A DAY, Disp: 300 each, Rfl: PRN   LORazepam (ATIVAN) 0.5 MG tablet, Take 1 tablet (0.5 mg total) by mouth daily., Disp: 30 tablet, Rfl: 2  losartan (COZAAR) 50 MG tablet, TAKE 1 TABLET BY MOUTH DAILY, Disp: 90 tablet, Rfl: 1   MELATONIN PO, Take by mouth., Disp: , Rfl:    metFORMIN (GLUCOPHAGE-XR) 500 MG 24 hr tablet, Take 2 tablets (1,000 mg total) by mouth 2 (two) times daily., Disp: 360 tablet, Rfl: 1   montelukast (SINGULAIR) 10 MG tablet, Take 1 tablet (10 mg total) by mouth at bedtime., Disp: 90 tablet, Rfl: 2   Multiple Vitamins-Minerals (MULTIVITAMIN PO), Take by mouth., Disp: , Rfl:    Omega-3 Fatty Acids (FISH OIL PO), Take by mouth., Disp: , Rfl:    dapagliflozin propanediol (FARXIGA) 10 MG TABS tablet, Take 1 tablet (10 mg total) by mouth daily., Disp: 90 tablet, Rfl: 3   Dulaglutide (TRULICITY) 3 MG/0.5ML SOPN, Inject 3 mg as directed once a week., Disp: 6 mL, Rfl: 1   ibuprofen (ADVIL) 600 MG tablet, Take 1 tablet (600 mg total) by mouth every  8 (eight) hours as needed., Disp: 270 tablet, Rfl: 3   temazepam (RESTORIL) 30 MG capsule, Take 1 capsule (30 mg total) by mouth at bedtime., Disp: 30 capsule, Rfl: 2  Allergies  Allergen Reactions   Prednisone     Intolerance    ROS neg/noncontributory except as noted HPI/below      Objective:     BP 136/82 (BP Location: Left Arm, Patient Position: Sitting, Cuff Size: Large)   Pulse 77   Temp 98.2 F (36.8 C) (Temporal)   Resp 18   Ht 6\' 3"  (1.905 m)   Wt 221 lb 8 oz (100.5 kg)   SpO2 95%   BMI 27.69 kg/m  Wt Readings from Last 3 Encounters:  04/01/23 221 lb 8 oz (100.5 kg)  11/09/22 219 lb (99.3 kg)  11/09/22 219 lb 6 oz (99.5 kg)    Physical Exam   Gen: WDWN NAD HEENT: NCAT, conjunctiva not injected, sclera nonicteric NECK:  supple, no thyromegaly, no nodes, no carotid bruits CARDIAC: RRR, S1S2+, no murmur. DP 2+B LUNGS: CTAB. No wheezes ABDOMEN:  BS+, soft, NTND, No HSM, no masses EXT:  no edema. +Varicose veins, bilaterally  MSK: no gross abnormalities.  NEURO: A&O x3.  CN II-XII intact.  PSYCH: normal mood. Good eye contact  PDMP reviewed today, no red flags       Assessment & Plan:  Type 2 diabetes mellitus with diabetic polyneuropathy, without long-term current use of insulin (HCC) Assessment & Plan: Chronic.  Well controlled.  Continue metformin 1000mg  bid,farxiga 10mg  daily, trulicity 3mg  weekly.  Statin intol.  On ARB  Orders: -     Comprehensive metabolic panel -     Hemoglobin A1c -     Microalbumin / creatinine urine ratio -     Trulicity; Inject 3 mg as directed once a week.  Dispense: 6 mL; Refill: 1 -     Dapagliflozin Propanediol; Take 1 tablet (10 mg total) by mouth daily.  Dispense: 90 tablet; Refill: 3  Hypertension associated with diabetes (HCC) Assessment & Plan: Chronic.  Controlled on losartan 50mg  daily.  Some white coat   Hyperlipidemia LDL goal <70 Assessment & Plan: Has PAD?Marland Kitchen  Chronic.  Off statin d/t myopathy.  On zetia  10mg  and fenofibrate 145mg . And trulicity 3mg  weekly  Orders: -     Comprehensive metabolic panel -     Lipid panel  PAD (peripheral artery disease) (HCC)  B12 deficiency Assessment & Plan: Chronic.  On metformin.  Check B12  Orders: -     Vitamin  B12  Primary insomnia Assessment & Plan: Chronic.  Well controlled on temazepam 30mg  nightly.  Pt aware of risks.  Not wanting to change  Orders: -     Temazepam; Take 1 capsule (30 mg total) by mouth at bedtime.  Dispense: 30 capsule; Refill: 2  Long term current use of oral hypoglycemic drug  Long-term current use of injectable noninsulin antidiabetic medication  Statin myopathy Assessment & Plan: So on zetia   Other orders -     Cephalexin; Take 1 capsule (500 mg total) by mouth once for 1 dose. 4 capsules 1 hr before dental work  Dispense: 8 capsule; Refill: 4 -     Ibuprofen; Take 1 tablet (600 mg total) by mouth every 8 (eight) hours as needed.  Dispense: 270 tablet; Refill: 3 -     LDL cholesterol, direct    Return in about 6 months (around 10/01/2023) for chronic follow-up-me 6 mo.   Tina for AWV after 9/27.   I,Rachel Rivera,acting as a scribe for Angelena Sole, MD.,have documented all relevant documentation on the behalf of Angelena Sole, MD,as directed by  Angelena Sole, MD while in the presence of Angelena Sole, MD.  I, Angelena Sole, MD, have reviewed all documentation for this visit. The documentation on 04/02/23 for the exam, diagnosis, procedures, and orders are all accurate and complete.   Angelena Sole, MD

## 2023-04-02 DIAGNOSIS — G720 Drug-induced myopathy: Secondary | ICD-10-CM | POA: Insufficient documentation

## 2023-04-02 NOTE — Assessment & Plan Note (Signed)
So on zetia

## 2023-04-02 NOTE — Assessment & Plan Note (Signed)
Has PAD?Marland Kitchen  Chronic.  Off statin d/t myopathy.  On zetia 10mg  and fenofibrate 145mg . And trulicity 3mg  weekly

## 2023-04-02 NOTE — Assessment & Plan Note (Signed)
Chronic.  Well controlled.  Continue metformin 1000mg  bid,farxiga 10mg  daily, trulicity 3mg  weekly.  Statin intol.  On ARB

## 2023-04-02 NOTE — Assessment & Plan Note (Signed)
Chronic.  Well controlled on temazepam 30mg  nightly.  Pt aware of risks.  Not wanting to change

## 2023-04-02 NOTE — Assessment & Plan Note (Signed)
Chronic.  On metformin.  Check B12

## 2023-04-02 NOTE — Assessment & Plan Note (Signed)
Chronic.  Controlled on losartan 50mg  daily.  Some white coat

## 2023-04-05 ENCOUNTER — Other Ambulatory Visit: Payer: Self-pay | Admitting: Family Medicine

## 2023-04-05 NOTE — Progress Notes (Signed)
Sugars are at goal of <7.  But increased from what it has been.  Continue same meds and working on diet/exercise 2.  B12 high-can continue as is or decrease supplement to 1/2 3.  Of course, cholesterol is still a problem-needs to continue to work on it.  He has tried several meds.  We can refer to lipid clinic, try the pravastatin just 2 days a week if can tolerate that low dose, or continue as is knowing not ideally treated and risks

## 2023-04-07 ENCOUNTER — Encounter: Payer: Self-pay | Admitting: Family Medicine

## 2023-04-15 DIAGNOSIS — Z23 Encounter for immunization: Secondary | ICD-10-CM | POA: Diagnosis not present

## 2023-04-22 ENCOUNTER — Telehealth: Payer: Self-pay | Admitting: Family Medicine

## 2023-04-22 NOTE — Telephone Encounter (Signed)
Patient sent a message received via in basket that stated the following below:    "For your records I got both my Moderna booster and double dose flu vaccine on April 15, 2023."

## 2023-04-29 ENCOUNTER — Other Ambulatory Visit (HOSPITAL_COMMUNITY): Payer: Self-pay

## 2023-04-30 ENCOUNTER — Encounter (HOSPITAL_COMMUNITY): Payer: Self-pay

## 2023-04-30 ENCOUNTER — Ambulatory Visit (HOSPITAL_COMMUNITY)
Admission: RE | Admit: 2023-04-30 | Discharge: 2023-04-30 | Disposition: A | Discharge status: Home / Self Care | Payer: Medicare Other | Source: Ambulatory Visit | Attending: Emergency Medicine | Admitting: Emergency Medicine

## 2023-04-30 ENCOUNTER — Other Ambulatory Visit (HOSPITAL_COMMUNITY): Payer: Self-pay

## 2023-04-30 VITALS — BP 151/95 | HR 78 | Temp 97.6°F | Resp 18

## 2023-04-30 DIAGNOSIS — J0100 Acute maxillary sinusitis, unspecified: Secondary | ICD-10-CM

## 2023-04-30 MED ORDER — CEPHALEXIN 500 MG PO CAPS
500.0000 mg | ORAL_CAPSULE | Freq: Four times a day (QID) | ORAL | 0 refills | Status: AC
  Filled 2023-04-30: qty 40, 10d supply, fill #0

## 2023-04-30 MED ORDER — ONETOUCH DELICA PLUS LANCET33G MISC: 1.0000 | Freq: Three times a day (TID) | 98 refills | Status: DC

## 2023-04-30 MED FILL — Losartan Potassium Tab 50 MG: ORAL | 90 days supply | Qty: 90 | Fill #0 | Status: AC

## 2023-04-30 NOTE — ED Provider Notes (Signed)
[facility]    CSN: [id number] Arrival date & time: 04/30/23  1718      History   Chief Complaint Chief Complaint  Patient presents with   Nasal Congestion    Entered by patient   Facial Pain   Generalized Body Aches    HPI Luis Fernandez is a 77 y.o. male.   Patient presents with sinus pain, body aches, and congestion x 2 weeks.  Patient reports trying nasal sprays, over-the-counter congestion medications, and saline rinses without relief.  Patient reports history of sinus infections and believes this is the same.  Denies shortness of breath, chest pain, fever, dizziness, and headaches.     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   Acute recurrent frontal sinusitis 04/21/2022   Motion sickness 01/19/2022   OSA on CPAP 08/19/2021   PAD (peripheral artery disease) (HCC) 08/19/2021   COVID-19 08/19/2021   Hamstring tendinitis at origin 07/04/2021   Bilateral hip pain 01/12/2021   Recurrent sinusitis 01/10/2020   Paresthesia 06/23/2016   OSA (obstructive sleep apnea) 06/23/2016   B12 deficiency 01/21/2016   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   NOSE SURGERY     NOSE SURGERY     bone spurs   TOTAL KNEE ARTHROPLASTY     UMBILICAL HERNIA REPAIR         Home Medications    Prior to Admission medications   Medication Sig Start Date End Date Taking? Authorizing Provider  aspirin EC 81 MG tablet Take 81 mg by mouth daily. Swallow whole.   Yes [provider]  cephALEXin (KEFLEX) 500 MG capsule Take 1 capsule (500 mg total) by mouth 4 (four) times daily for 10 days. 04/30/23 05/10/23 Yes Karelyn Brisby A, NP  Cyanocobalamin (VITAMIN B12 PO) Take by mouth.   Yes [provider]  dapagliflozin propanediol (FARXIGA) 10 MG TABS tablet Take 1 tablet (10 mg total) by mouth daily. 04/01/23  Yes Jeani Sow, MD  Dulaglutide (TRULICITY) 3 MG/0.5ML SOPN Inject 3 mg into the skin once a week. 04/01/23  Yes Jeani Sow, MD  ezetimibe (ZETIA) 10 MG tablet Take 1 tablet (10 mg total) by mouth daily. 10/29/22  Yes Jeani Sow, MD  fenofibrate (TRICOR) 145 MG tablet Take 1 tablet (145 mg total) by mouth daily. 01/05/23  Yes Jeani Sow, MD  Glucosamine 500 MG CAPS Take by mouth.   Yes [provider]  glucose blood (ONETOUCH ULTRA) test strip Use to test  blood sugar three times daily 03/16/23  Yes Jeani Sow, MD  ibuprofen (ADVIL) 600 MG tablet Take 1 tablet (600 mg total) by mouth every 8 (eight) hours as needed. 04/01/23  Yes Jeani Sow, MD  Lancets [facility] DELICA PLUS [place]) MISC USE AS DIRECTED THREE TIMES A DAY 03/16/23  Yes Jeani Sow, MD  Lancets [facility] DELICA PLUS [place]) MISC Use as directed to check blood sugar three times a day. 09/18/22  Yes [name], DO  LORazepam (ATIVAN) 0.5 MG tablet TAKE 1 TABLET BY MOUTH DAILY 04/06/23  Yes Jeani Sow, MD  losartan (COZAAR) 50 MG tablet TAKE 1 TABLET BY MOUTH DAILY 12/21/22  Yes [name], DO  MELATONIN PO Take by mouth.   Yes [provider]  metFORMIN (GLUCOPHAGE-XR) 500 MG 24 hr tablet Take 2 tablets (1,000 mg total) by mouth 2 (two) times daily. 02/15/23  Yes Jeani Sow, MD  montelukast (SINGULAIR) 10 MG tablet Take 1 tablet (10 mg total) by mouth at bedtime. 09/30/22  Yes Jeani Sow, MD  Multiple Vitamins-Minerals (MULTIVITAMIN PO) Take by mouth.   Yes [provider]  Omega-3 Fatty Acids (FISH OIL PO) Take by mouth.   Yes [provider]  temazepam (RESTORIL) 30 MG capsule Take 1 capsule (30 mg total) by mouth at bedtime. 04/01/23  Yes Jeani Sow, MD    Family History Family History  Problem Relation Age of Onset   Heart disease Mother    Hypertension Mother    Parkinson's disease Father    Dementia Brother    Hypertension Brother    Depression Brother    COPD Brother    Colon cancer Neg Hx    Stomach cancer Neg Hx    Rectal cancer Neg Hx    Pancreatic cancer Neg Hx    Esophageal cancer Neg Hx     Social History Social History   Tobacco Use   Smoking status: Former    Current packs/day: 0.00    Average packs/day: 1.5 packs/day for 10.0 years (15.0 ttl pk-yrs)    Types: Cigarettes, Pipe    Start date: 08/17/1966    Quit date: 08/17/1976    Years since quitting: 46.7   Smokeless tobacco: Never  Vaping Use   Vaping status: Never Used  Substance Use Topics   Alcohol use: Yes    Alcohol/week: 1.0 standard drink of alcohol    Types: 1 Cans of beer per week    Comment: 2-3 beers a week, occasionally/glass of wine 2 times weekly   Drug use: Never     Allergies   Prednisone   Review of Systems Review of Systems  Constitutional:  Negative for chills, fatigue and fever.  HENT:  Positive for congestion, rhinorrhea, sinus pressure and sinus pain. Negative for sore throat and trouble swallowing.   Respiratory:  Negative for cough and shortness of breath.   Neurological:  Negative for dizziness, weakness, light-headedness and headaches.     Physical  Exam Triage Vital Signs ED Triage Vitals  Encounter Vitals Group     BP 04/30/23 1725 (!) 151/95     Systolic BP Percentile --      Diastolic BP Percentile --      Pulse Rate 04/30/23 1725 78     Resp 04/30/23 1725 18     Temp 04/30/23 1725 97.6 F (36.4 C)     Temp Source 04/30/23 1725 Oral     SpO2 04/30/23 1725 96 %  Weight --      Height --      Head Circumference --      Peak Flow --      Pain Score 04/30/23 1724 4     Pain Loc --      Pain Education --      Exclude from Growth Chart --    No data found.  Updated Vital Signs BP (!) 151/95 (BP Location: Left Arm)   Pulse 78   Temp 97.6 F (36.4 C) (Oral)   Resp 18   SpO2 96%   Visual Acuity Right Eye Distance:   Left Eye Distance:   Bilateral Distance:    Right Eye Near:   Left Eye Near:    Bilateral Near:     Physical Exam Vitals and nursing note reviewed.  Constitutional:      General: He is awake. He is not in acute distress.    Appearance: Normal appearance. He is well-developed and well-groomed. He is not ill-appearing, toxic-appearing or diaphoretic.  HENT:     Right Ear: Tympanic membrane, ear canal and external ear normal.     Left Ear: Tympanic membrane, ear canal and external ear normal.     Nose: Congestion and rhinorrhea present.     Right Sinus: Maxillary sinus tenderness present. No frontal sinus tenderness.     Left Sinus: Maxillary sinus tenderness present. No frontal sinus tenderness.     Mouth/Throat:     Mouth: Mucous membranes are moist.     Pharynx: Posterior oropharyngeal erythema and postnasal drip present. No pharyngeal swelling or oropharyngeal exudate.     Tonsils: No tonsillar exudate.  Skin:    General: Skin is warm and dry.  Neurological:     General: No focal deficit present.     Mental Status: He is alert and oriented to person, place, and time.     GCS: GCS eye subscore is 4. GCS verbal subscore is 5. GCS motor subscore is 6.  Psychiatric:        Behavior: Behavior  is cooperative.      UC Treatments / Results  Labs (all labs ordered are listed, but only abnormal results are displayed) Labs Reviewed - No data to display  EKG   Radiology No results found.  Procedures Procedures (including critical care time)  Medications Ordered in UC Medications - No data to display  Initial Impression / Assessment and Plan / UC Course  I have reviewed the triage vital signs and the nursing notes.  Pertinent labs & imaging results that were available during my care of the patient were reviewed by me and considered in my medical decision making (see chart for details).     Patient presented with 2-week history of sinus pain, body aches, and congestion. Reports history of sinus infections and believes this is the same. Patient reports the only thing that helps with his sinus infections is taking cephalexin 4 times daily for 10 to 14 days.  Patient was prescribed the same 2 years ago for sinusitis.  Upon assessment patient has tenderness to maxillary sinuses.  No other significant findings.  Patient's blood pressure is elevated 151/95.  Patient reports taking blood pressure medication today.  Patient denies dizziness, headache, confusion, weakness, numbness, and blurred vision.  No neurodeficits upon exam.  GCS 15.  Prescribed cephalexin for acute sinusitis.  Discussed follow-up, return, emergency department precautions. Final Clinical Impressions(s) / UC Diagnoses   Final diagnoses:  Acute non-recurrent maxillary sinusitis  Discharge Instructions      Start taking Keflex 4 times daily for 10 days. Continue using over-the-counter medications to help with symptoms. Return here if symptoms persist. If blood pressure continues to be elevated please follow-up with primary care doctor. If you develop severe headache, blurred vision, dizziness, weakness, or numbness please seek immediate medical treatment in there ER.     ED Prescriptions     Medication  Sig Dispense Auth. Provider   cephALEXin (KEFLEX) 500 MG capsule Take 1 capsule (500 mg total) by mouth 4 (four) times daily for 10 days. 40 capsule Wynonia Lawman A, NP      PDMP not reviewed this encounter.   Wynonia Lawman A, NP 04/30/23 [phone]

## 2023-04-30 NOTE — Discharge Instructions (Signed)
Start taking Keflex 4 times daily for 10 days. Continue using over-the-counter medications to help with symptoms. Return here if symptoms persist. If blood pressure continues to be elevated please follow-up with primary care doctor. If you develop severe headache, blurred vision, dizziness, weakness, or numbness please seek immediate medical treatment in there ER.

## 2023-04-30 NOTE — ED Triage Notes (Signed)
Pt states he has facial pain, body aches, congestion x 2 weeks. He has tried OTC meds. States he knows the signs and he has a sinus infection states he needs an antibiotic.

## 2023-05-01 ENCOUNTER — Other Ambulatory Visit (HOSPITAL_COMMUNITY): Payer: Self-pay

## 2023-05-06 ENCOUNTER — Other Ambulatory Visit: Payer: Self-pay | Admitting: Family Medicine

## 2023-05-06 ENCOUNTER — Other Ambulatory Visit: Payer: Self-pay

## 2023-05-06 ENCOUNTER — Other Ambulatory Visit (HOSPITAL_COMMUNITY): Payer: Self-pay

## 2023-05-06 DIAGNOSIS — E1142 Type 2 diabetes mellitus with diabetic polyneuropathy: Secondary | ICD-10-CM

## 2023-05-06 MED ORDER — ACCU-CHEK SOFTCLIX LANCETS MISC
3 refills | Status: DC
  Filled 2023-05-06: qty 100, 33d supply, fill #0
  Filled 2023-06-19: qty 100, 33d supply, fill #1

## 2023-05-06 MED ORDER — ACCU-CHEK GUIDE VI STRP
ORAL_STRIP | 3 refills | Status: DC
  Filled 2023-05-06: qty 100, 33d supply, fill #0

## 2023-05-06 MED ORDER — ACCU-CHEK GUIDE W/DEVICE KIT
PACK | 1 refills | Status: DC
  Filled 2023-05-06: qty 1, 30d supply, fill #0

## 2023-05-06 MED FILL — Lorazepam Tab 0.5 MG: ORAL | 30 days supply | Qty: 30 | Fill #0 | Status: AC

## 2023-05-07 ENCOUNTER — Other Ambulatory Visit (HOSPITAL_COMMUNITY): Payer: Self-pay

## 2023-05-20 ENCOUNTER — Other Ambulatory Visit (HOSPITAL_COMMUNITY): Payer: Self-pay

## 2023-05-21 ENCOUNTER — Other Ambulatory Visit (HOSPITAL_COMMUNITY): Payer: Self-pay

## 2023-06-03 MED FILL — Lorazepam Tab 0.5 MG: ORAL | 30 days supply | Qty: 30 | Fill #1 | Status: AC

## 2023-06-04 ENCOUNTER — Other Ambulatory Visit (HOSPITAL_COMMUNITY): Payer: Self-pay

## 2023-06-05 ENCOUNTER — Other Ambulatory Visit (HOSPITAL_COMMUNITY): Payer: Self-pay

## 2023-06-07 ENCOUNTER — Ambulatory Visit (INDEPENDENT_AMBULATORY_CARE_PROVIDER_SITE_OTHER): Payer: Medicare Other

## 2023-06-07 ENCOUNTER — Other Ambulatory Visit (HOSPITAL_COMMUNITY): Payer: Self-pay

## 2023-06-07 VITALS — BP 130/80 | HR 85 | Temp 98.0°F | Wt 226.8 lb

## 2023-06-07 DIAGNOSIS — Z0000 Encounter for general adult medical examination without abnormal findings: Secondary | ICD-10-CM | POA: Diagnosis not present

## 2023-06-07 NOTE — Progress Notes (Addendum)
Subjective:   Luis Fernandez is a 77 y.o. male who presents for Medicare Annual/Subsequent preventive examination.  Visit Complete: In person  Patient Medicare AWV questionnaire was completed by the patient on 06/03/23; I have confirmed that all information answered by patient is correct and no changes since this date.  Cardiac Risk Factors include: advanced age (>50men, >22 women);diabetes mellitus;dyslipidemia;male gender;hypertension     Objective:    Today's Vitals   06/07/23 1117  BP: 130/80  Pulse: 85  Temp: 98 F (36.7 C)  SpO2: 92%  Weight: 226 lb 12.8 oz (102.9 kg)   Body mass index is 28.35 kg/m.     06/07/2023   11:25 AM 10/30/2022   10:20 AM 04/29/2022   11:03 AM 04/26/2021   11:07 AM 01/23/2021    2:02 PM 09/12/2018   10:56 AM 09/30/2017    9:36 AM  Advanced Directives  Does Patient Have a Medical Advance Directive? Yes Yes Yes Yes Yes Yes Yes  Type of Estate agent of [place];Living will Living will;Healthcare Power of Attorney Living will Healthcare Power of [place];Living will Healthcare Power of [place];Living will;Out of facility DNR (pink MOST or yellow form) Healthcare Power of [place];Living will Healthcare Power of [place];Living will  Does patient want to make changes to medical advance directive? No - Patient declined  No - Patient declined No - Patient declined No - Patient declined Yes (MAU/Ambulatory/Procedural Areas - Information given)   Copy of Healthcare Power of Attorney in Chart? Yes - validated most recent copy scanned in chart (See row information) No - copy requested, Physician notified  Yes - validated most recent copy scanned in chart (See row information) No - copy requested No - copy requested Yes    Current Medications (verified) Outpatient Encounter Medications as of 06/07/2023  Medication Sig   Accu-Chek Softclix Lancets lancets Use to test blood sugar up to 3 times daily   aspirin EC 81 MG tablet Take 81  mg by mouth daily. Swallow whole.   Blood Glucose Monitoring Suppl (ACCU-CHEK GUIDE) w/Device KIT Use to test blood sugar up to 3 times daily   Cyanocobalamin (VITAMIN B12 PO) Take by mouth.   dapagliflozin propanediol (FARXIGA) 10 MG TABS tablet Take 1 tablet (10 mg total) by mouth daily.   Dulaglutide (TRULICITY) 3 MG/0.5ML SOPN Inject 3 mg into the skin once a week.   ezetimibe (ZETIA) 10 MG tablet Take 1 tablet (10 mg total) by mouth daily.   fenofibrate (TRICOR) 145 MG tablet Take 1 tablet (145 mg total) by mouth daily.   Glucosamine 500 MG CAPS Take by mouth.   glucose blood (ACCU-CHEK GUIDE) test strip Use to test blood sugar up to 3 times daily   ibuprofen (ADVIL) 600 MG tablet Take 1 tablet (600 mg total) by mouth every 8 (eight) hours as needed.   LORazepam (ATIVAN) 0.5 MG tablet Take 1 tablet (0.5 mg total) by mouth daily.   losartan (COZAAR) 50 MG tablet TAKE 1 TABLET BY MOUTH DAILY   MELATONIN PO Take by mouth.   metFORMIN (GLUCOPHAGE-XR) 500 MG 24 hr tablet Take 2 tablets (1,000 mg total) by mouth 2 (two) times daily.   montelukast (SINGULAIR) 10 MG tablet Take 1 tablet (10 mg total) by mouth at bedtime.   Multiple Vitamins-Minerals (MULTIVITAMIN PO) Take by mouth.   Omega-3 Fatty Acids (FISH OIL PO) Take by mouth.   temazepam (RESTORIL) 30 MG capsule Take 1 capsule (30 mg total) by mouth at bedtime.  No facility-administered encounter medications on file as of 06/07/2023.    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   NOSE SURGERY     NOSE SURGERY     bone spurs   TOTAL KNEE ARTHROPLASTY     UMBILICAL HERNIA REPAIR     Family History  Problem Relation Age of Onset   Heart disease Mother    Hypertension Mother    Parkinson's disease Father    Dementia Brother    Hypertension Brother    Depression Brother    COPD Brother    Colon cancer Neg Hx    Stomach cancer Neg Hx    Rectal cancer Neg Hx    Pancreatic cancer Neg Hx    Esophageal cancer Neg Hx    Social History   Socioeconomic History   Marital status: Married    Spouse name: Johnny Bridge   Number of children: 1   Years of education: 19   Highest education level: Professional school degree (e.g., MD, DDS, DVM, JD)  Occupational History   Occupation: Lawyer    Comment: retired  Tobacco Use   Smoking status: Former    Current packs/day: 0.00    Average packs/day: 1.5 packs/day for 10.0 years (15.0 ttl pk-yrs)    Types: Cigarettes, Pipe    Start date: 08/17/1966    Quit date: 08/17/1976    Years since quitting: 46.8   Smokeless tobacco: Never  Vaping Use   Vaping status: Never Used  Substance and Sexual Activity   Alcohol use: Yes    Alcohol/week: 1.0 standard drink of alcohol    Types: 1 Cans of beer per week    Comment: 2-3 beers a week, occasionally/glass of wine 2 times weekly   Drug use: Never   Sexual activity: Yes  Other Topics Concern   Not on file  Social History Narrative   Lives with spouse at home. He has one child-3 grands. He enjoys reading, travelling and gardening.   Retired judge   Social Determinants of Corporate investment banker Strain: Low Risk  (06/03/2023)   Overall Financial Resource Strain (CARDIA)    Difficulty of Paying Living Expenses: Not hard at all  Food Insecurity: No Food Insecurity (06/03/2023)   Hunger Vital Sign    Worried  About Running Out of Food in the Last Year: Never true    Ran Out of Food in the Last Year: Never true  Transportation Needs: No Transportation Needs (06/03/2023)   PRAPARE - Administrator, Civil Service (Medical): No    Lack of Transportation (Non-Medical): No  Physical Activity: Sufficiently Active (06/03/2023)   Exercise Vital Sign    Days of Exercise per Week: 6 days    Minutes of Exercise per Session: 40  min  Stress: Stress Concern Present (06/03/2023)   [employer] of Occupational Health - Occupational Stress Questionnaire    Feeling of Stress : To some extent  Social Connections: Moderately Integrated (06/03/2023)   Social Connection and Isolation Panel [NHANES]    Frequency of Communication with Friends and Family: Three times a week    Frequency of Social Gatherings with Friends and Family: Once a week    Attends Religious Services: Never    Database administrator or Organizations: Yes    Attends Engineer, structural: More than 4 times per year    Marital Status: Married    Tobacco Counseling Counseling given: Not Answered   Clinical Intake:  Pre-visit preparation completed: Yes  Pain : No/denies pain     BMI - recorded: 28.35 Nutritional Status: BMI 25 -29 Overweight Nutritional Risks: None Diabetes: Yes CBG done?: Yes CBG resulted in Enter/ Edit results?: No Did pt. bring in CBG monitor from home?: No  How often do you need to have someone help you when you read instructions, pamphlets, or other written materials from your doctor or pharmacy?: 1 - Never  Interpreter Needed?: No  Information entered by :: Lanier Ensign, LPN   Activities of Daily Living    06/03/2023   10:04 PM 04/29/2023   12:28 PM  In your present state of health, do you have any difficulty performing the following activities:  Hearing? 1 1  Comment wears a hearing aid   Vision? 0 0  Difficulty concentrating or making decisions? 0 0  Walking or climbing  stairs? 1 1  Dressing or bathing? 0 0  Doing errands, shopping? 0 0  Preparing Food and eating ? N N  Using the Toilet? N N  In the past six months, have you accidently leaked urine? N N  Do you have problems with loss of bowel control? N N  Managing your Medications? N N  Managing your Finances? N N  Housekeeping or managing your Housekeeping? N N    Patient Care Team: Jeani Sow, MD as PCP - General (Family Medicine) Jodelle Red, MD as PCP - Cardiology (Cardiology)  Indicate any recent Medical Services you may have received from other than [facility] providers in the past year (date may be approximate).     Assessment:   This is a routine wellness examination for [employer].  Hearing/Vision screen Hearing Screening - Comments:: Pt wears hearing aids  Vision Screening - Comments:: Pt follows up [facility] for annual eye exams    Goals Addressed             This Visit's Progress    Patient Stated       Lose weight        Depression Screen    06/07/2023   11:25 AM 04/01/2023   11:01 AM 11/09/2022   10:29 AM 09/30/2022    2:34 PM 04/29/2022   11:02 AM 07/21/2021   10:06 AM 04/26/2021   11:14 AM  PHQ 2/9 Scores  PHQ - 2 Score 0 0 0 1 0 0 0  PHQ- 9 Score  2 2 3        Fall Risk    06/03/2023   10:04 PM 04/29/2023   12:28 PM 04/01/2023   11:00 AM 11/09/2022   10:28 AM 09/30/2022    2:33 PM  Fall Risk   Falls in the past year? 1 1 1  0 1  Number falls in past yr: 1 1 1  0  0  Injury with Fall? 0 0 0 0 0  Risk for fall due to : Impaired balance/gait   No Fall Risks Impaired balance/gait  Follow up Falls prevention discussed  Falls prevention discussed Falls evaluation completed Falls prevention discussed    MEDICARE RISK AT HOME: Medicare Risk at Home Any stairs in or around the home?: Yes If so, are there any without handrails?: No Home free of loose throw rugs in walkways, pet beds, electrical cords, etc?: Yes Adequate lighting in your home to  reduce risk of falls?: Yes Life alert?: No Use of a cane, walker or w/c?: No Grab bars in the bathroom?: Yes Shower chair or bench in shower?: No Elevated toilet seat or a handicapped toilet?: No  TIMED UP AND GO:  Was the test performed?  Yes  Length of time to ambulate 10 feet: 10 sec Gait steady and fast without use of assistive device    Cognitive Function:        06/07/2023   11:28 AM 04/29/2022   11:06 AM 04/26/2021   11:16 AM 09/12/2018   11:02 AM 12/29/2016    9:50 AM  6CIT Screen  What Year? 0 points 0 points 0 points 0 points 0 points  What month? 0 points 0 points 0 points 0 points 0 points  What time? 0 points 0 points 0 points 0 points 0 points  Count back from 20 0 points 0 points 0 points 0 points 0 points  Months in reverse 0 points 0 points  0 points 0 points  Repeat phrase 0 points 2 points  2 points 4 points  Total Score 0 points 2 points  2 points 4 points    Immunizations Immunization History  Administered Date(s) Administered   Fluad Quad(high Dose 65+) 04/19/2019   Influenza Inj Mdck Quad Pf 03/28/2021   Influenza, High Dose Seasonal PF 04/05/2018   Influenza-Unspecified 05/12/2016, 05/12/2017, 04/12/2020, 03/27/2022   MODERNA COVID-19 SARS-COV-2 PEDS BIVALENT BOOSTER 60yr-60yr 03/02/2022   Moderna Sars-Covid-2 Vaccination 09/02/2019, 10/02/2019, 06/03/2020, 11/15/2020, 04/14/2021   Pfizer(Comirnaty)Fall Seasonal Vaccine 12 years and older 05/15/2022   Pneumococcal Conjugate-13 06/22/2014   Pneumococcal Polysaccharide-23 02/20/2010   Pneumococcal-Unspecified 02/20/2010   Respiratory Syncytial Virus Vaccine,Recomb Aduvanted(Arexvy) 03/28/2022, 05/15/2022   Tdap 03/04/2009, 05/14/2019   Zoster Recombinant(Shingrix) 11/01/2016, 03/01/2017   Zoster, Live 01/01/2002    TDAP status: Up to date  Flu Vaccine status: Up to date  Pneumococcal vaccine status: Up to date  Covid-19 vaccine status: Information provided on how to obtain vaccines.    Qualifies for Shingles Vaccine? Yes   Zostavax completed Yes   Shingrix Completed?: Yes  Screening Tests Health Maintenance  Topic Date Due   COVID-19 Vaccine (8 - 2023-24 season) 04/04/2023   FOOT EXAM  05/01/2023   OPHTHALMOLOGY EXAM  05/20/2023   HEMOGLOBIN A1C  10/01/2023   Diabetic kidney evaluation - eGFR measurement  03/31/2024   Diabetic kidney evaluation - Urine ACR  03/31/2024   Medicare Annual Wellness (AWV)  06/06/2024   DTaP/Tdap/Td (3 - Td or Tdap) 05/13/2029   Pneumonia Vaccine 73+ Years old  Completed   INFLUENZA VACCINE  Completed   Hepatitis C Screening  Completed   Zoster Vaccines- Shingrix  Completed   HPV VACCINES  Aged Out   Fecal DNA (Cologuard)  Discontinued    Health Maintenance  Health Maintenance Due  Topic Date Due   COVID-19 Vaccine (8 - 2023-24 season) 04/04/2023   FOOT EXAM  05/01/2023   OPHTHALMOLOGY EXAM  05/20/2023  Colorectal cancer screening: No longer required.    Additional Screening:  Hepatitis C Screening:  Completed 03/18/16  Vision Screening: Recommended annual ophthalmology exams for early detection of glaucoma and other disorders of the eye. Is the patient up to date with their annual eye exam?  No  Who is the provider or what is the name of the office in which the patient attends annual eye exams? [facility]  If pt is not established with a provider, would they like to be referred to a provider to establish care? No .   Dental Screening: Recommended annual dental exams for proper oral hygiene  Diabetic Foot Exam: Diabetic Foot Exam: Overdue, Pt has been advised about the importance in completing this exam. Pt is scheduled for diabetic foot exam on next appt .  Community Resource Referral / Chronic Care Management: CRR required this visit?  No   CCM required this visit?  No     Plan:     I have personally reviewed and noted the following in the patient's chart:   Medical and social history Use of alcohol,  tobacco or illicit drugs  Current medications and supplements including opioid prescriptions. Patient is not currently taking opioid prescriptions. Functional ability and status Nutritional status Physical activity Advanced directives List of other physicians Hospitalizations, surgeries, and ER visits in previous 12 months Vitals Screenings to include cognitive, depression, and falls Referrals and appointments  In addition, I have reviewed and discussed with patient certain preventive protocols, quality metrics, and best practice recommendations. A written personalized care plan for preventive services as well as general preventive health recommendations were provided to patient.     Marzella Schlein, LPN   [id number]   After Visit Summary: (In Person-Printed) AVS printed and given to the patient  Nurse Notes: none

## 2023-06-07 NOTE — Patient Instructions (Signed)
Mr. Shipes , Thank you for taking time to come for your Medicare Wellness Visit. I appreciate your ongoing commitment to your health goals. Please review the following plan we discussed and let me know if I can assist you in the future.   Referrals/Orders/Follow-Ups/Clinician Recommendations: Each day, aim for 6 glasses of water, plenty of protein in your diet and try to get up and walk/ stretch every hour for 5-10 minutes at a time.   Aim for 30 minutes of exercise or brisk walking, 6-8 glasses of water, and 5 servings of fruits and vegetables each day.  Continue to work on losing weight   This is a list of the screening recommended for you and due dates:  Health Maintenance  Topic Date Due   COVID-19 Vaccine (8 - 2023-24 season) 04/04/2023   Complete foot exam   05/01/2023   Eye exam for diabetics  05/20/2023   Hemoglobin A1C  10/01/2023   Yearly kidney function blood test for diabetes  03/31/2024   Yearly kidney health urinalysis for diabetes  03/31/2024   Medicare Annual Wellness Visit  06/06/2024   DTaP/Tdap/Td vaccine (3 - Td or Tdap) 05/13/2029   Pneumonia Vaccine  Completed   Flu Shot  Completed   Hepatitis C Screening  Completed   Zoster (Shingles) Vaccine  Completed   HPV Vaccine  Aged Out   Cologuard (Stool DNA test)  Discontinued    Advanced directives: (In Chart) A copy of your advanced directives are scanned into your chart should your provider ever need it.  Next Medicare Annual Wellness Visit scheduled for next year: Yes

## 2023-06-08 DIAGNOSIS — E119 Type 2 diabetes mellitus without complications: Secondary | ICD-10-CM | POA: Diagnosis not present

## 2023-06-08 DIAGNOSIS — H35033 Hypertensive retinopathy, bilateral: Secondary | ICD-10-CM | POA: Diagnosis not present

## 2023-06-08 LAB — HM DIABETES EYE EXAM

## 2023-06-13 ENCOUNTER — Other Ambulatory Visit: Payer: Self-pay | Admitting: Family Medicine

## 2023-06-20 MED FILL — Lorazepam Tab 0.5 MG: ORAL | 30 days supply | Qty: 30 | Fill #2 | Status: CN

## 2023-06-21 ENCOUNTER — Other Ambulatory Visit: Payer: Self-pay | Admitting: Family Medicine

## 2023-06-21 ENCOUNTER — Other Ambulatory Visit (HOSPITAL_COMMUNITY): Payer: Self-pay

## 2023-06-21 DIAGNOSIS — E1142 Type 2 diabetes mellitus with diabetic polyneuropathy: Secondary | ICD-10-CM

## 2023-06-21 MED ORDER — LOSARTAN POTASSIUM 50 MG PO TABS: 50.0000 mg | ORAL_TABLET | ORAL | 1 refills | Status: DC

## 2023-06-21 MED ORDER — EZETIMIBE 10 MG PO TABS
10.0000 mg | ORAL_TABLET | Freq: Every day | ORAL | 3 refills | Status: DC
  Filled 2023-06-21: qty 90, 90d supply, fill #0
  Filled 2023-09-15: qty 90, 90d supply, fill #1
  Filled 2023-12-16: qty 90, 90d supply, fill #2
  Filled 2024-03-14: qty 90, 90d supply, fill #3

## 2023-06-21 MED ORDER — GLUCOSE BLOOD VI STRP
ORAL_STRIP | 3 refills | Status: DC
  Filled 2023-06-21: qty 100, 33d supply, fill #0

## 2023-06-21 MED ORDER — EZETIMIBE 10 MG PO TABS
10.0000 mg | ORAL_TABLET | Freq: Every day | ORAL | 3 refills | Status: DC
  Filled 2023-06-21: qty 90, 90d supply, fill #0

## 2023-06-21 MED ORDER — MONTELUKAST SODIUM 10 MG PO TABS
10.0000 mg | ORAL_TABLET | Freq: Every day | ORAL | 2 refills | Status: DC
  Filled 2023-06-21 – 2023-07-18 (×2): qty 90, 90d supply, fill #0
  Filled 2023-11-08: qty 90, 90d supply, fill #1
  Filled 2024-02-07: qty 90, 90d supply, fill #2

## 2023-06-21 MED ORDER — LOSARTAN POTASSIUM 50 MG PO TABS
50.0000 mg | ORAL_TABLET | Freq: Every day | ORAL | 1 refills | Status: DC
  Filled 2023-06-21 – 2023-08-12 (×2): qty 90, 90d supply, fill #0
  Filled 2023-11-08: qty 90, 90d supply, fill #1

## 2023-06-22 ENCOUNTER — Other Ambulatory Visit (HOSPITAL_COMMUNITY): Payer: Self-pay

## 2023-07-01 MED FILL — Lorazepam Tab 0.5 MG: ORAL | 30 days supply | Qty: 30 | Fill #2 | Status: AC

## 2023-07-02 ENCOUNTER — Other Ambulatory Visit: Payer: Self-pay

## 2023-07-02 ENCOUNTER — Other Ambulatory Visit (HOSPITAL_COMMUNITY): Payer: Self-pay

## 2023-07-02 ENCOUNTER — Other Ambulatory Visit: Payer: Self-pay | Admitting: Family Medicine

## 2023-07-02 DIAGNOSIS — F5101 Primary insomnia: Secondary | ICD-10-CM

## 2023-07-02 MED ORDER — TEMAZEPAM 30 MG PO CAPS
30.0000 mg | ORAL_CAPSULE | Freq: Every day | ORAL | 3 refills | Status: DC
  Filled 2023-07-02: qty 30, 30d supply, fill #0
  Filled 2023-07-31: qty 30, 30d supply, fill #1
  Filled 2023-08-29: qty 30, 30d supply, fill #2
  Filled 2023-09-28: qty 30, 30d supply, fill #3

## 2023-07-19 ENCOUNTER — Other Ambulatory Visit (HOSPITAL_COMMUNITY): Payer: Self-pay

## 2023-07-19 ENCOUNTER — Other Ambulatory Visit: Payer: Self-pay

## 2023-07-20 ENCOUNTER — Other Ambulatory Visit (HOSPITAL_COMMUNITY): Payer: Self-pay

## 2023-07-31 ENCOUNTER — Other Ambulatory Visit (HOSPITAL_COMMUNITY): Payer: Self-pay

## 2023-07-31 MED FILL — Lorazepam Tab 0.5 MG: ORAL | 30 days supply | Qty: 30 | Fill #3 | Status: AC

## 2023-07-31 MED FILL — Lorazepam Tab 0.5 MG: ORAL | 30 days supply | Qty: 30 | Fill #3 | Status: CN

## 2023-08-06 NOTE — Progress Notes (Signed)
 I, Leotis Batter, CMA acting as a scribe for Artist Lloyd, MD.  Luis Fernandez is a 78 y.o. male who presents to [facility] at [facility] today for cont'd R hand pain. Pt was last seen by Dr. Lloyd on 10/06/22 and was given a R 4th and 5th trigger finger injections.   Today, pt reports wax and waning of sx over the past few months. Pt locates pain to 3rd and 4th fingers of right hand. Would like to discuss recommendation for hand specialist. Some swelling present. Intermittent pain in the lower arm. Limited ROM in the fingers. Has worked on gentle ROM. Taking IBU prn.   Pertinent review of systems: No fevers or chills  Relevant historical information: Peripheral arterial disease and diabetes   Exam:  BP (!) 142/80   Pulse 86   Ht 6' 3 (1.905 m)   Wt 225 lb (102.1 kg)   SpO2 94%   BMI 28.12 kg/m  General: Well Developed, well nourished, and in no acute distress.   MSK: Right hand some soft tissue cords are present at the ulnar palmar hand consistent with mild Dupuytren's contracture. Some swelling present at the palmar third and fourth MCP.  This is mildly tender to palpation.  Hand motion patient lacks full flexion at PIP joint third and fourth digits.  Intact strength.  No triggering is present.    Lab and Radiology Results  Procedure: Real-time Ultrasound Guided Injection of right hand third digit tendon sheath at A1 pulley (trigger finger injection) Device: Philips Affiniti 50G/GE Logiq Images permanently stored and available for review in PACS Verbal informed consent obtained.  Discussed risks and benefits of procedure. Warned about infection, bleeding, hyperglycemia damage to structures among others. Patient expresses understanding and agreement Time-out conducted.   Noted no overlying erythema, induration, or other signs of local infection.   Skin prepped in a sterile fashion.   Local anesthesia: Topical Ethyl chloride.   With sterile technique and  under real time ultrasound guidance: 20 mg of Kenalog  and 1 mL of lidocaine injected into tendon sheath at A1 pulley. Fluid seen entering the tendon sheath.   Completed without difficulty   Pain immediately resolved suggesting accurate placement of the medication.   Advised to call if fevers/chills, erythema, induration, drainage, or persistent bleeding.   Images permanently stored and available for review in the ultrasound unit.  Impression: Technically successful ultrasound guided injection.    Procedure: Real-time Ultrasound Guided Injection of right hand fourth digit A1 pulley tendon sheath (trigger finger injection) Device: Philips Affiniti 50G/GE Logiq Images permanently stored and available for review in PACS Verbal informed consent obtained.  Discussed risks and benefits of procedure. Warned about infection, bleeding, hyperglycemia damage to structures among others. Patient expresses understanding and agreement Time-out conducted.   Noted no overlying erythema, induration, or other signs of local infection.   Skin prepped in a sterile fashion.   Local anesthesia: Topical Ethyl chloride.   With sterile technique and under real time ultrasound guidance: 20 mg of Kenalog  and 1 mL of lidocaine injected into tendon sheath at A1 pulley. Fluid seen entering the tendon sheath.   Completed without difficulty   Pain immediately resolved suggesting accurate placement of the medication.   Advised to call if fevers/chills, erythema, induration, drainage, or persistent bleeding.   Images permanently stored and available for review in the ultrasound unit.  Impression: Technically successful ultrasound guided injection.        Assessment and Plan: 79 y.o. male  with right hand pain and stiffness at third and fourth MCP palmar aspect due to developing trigger finger.  Plan for injection today.  Consider hand therapy.  Recheck back as needed.  Reduced dose of the injection today given diabetes  would like to avoid hyperglycemia.  Total dose was 40 mg of triamcinolone  which she has tolerated in the past.  Diabetes is currently controlled. PDMP not reviewed this encounter. Orders Placed This Encounter  Procedures   US  LIMITED JOINT SPACE STRUCTURES UP RIGHT(NO LINKED CHARGES)    Reason for Exam (SYMPTOM  OR DIAGNOSIS REQUIRED):   right hand/finger pain    Preferred imaging location?:   [facility]   No orders of the defined types were placed in this encounter.    Discussed warning signs or symptoms. Please see discharge instructions. Patient expresses understanding.   The above documentation has been reviewed and is accurate and complete Artist Lloyd, M.D.

## 2023-08-09 ENCOUNTER — Ambulatory Visit (INDEPENDENT_AMBULATORY_CARE_PROVIDER_SITE_OTHER): Payer: Medicare Other | Admitting: Family Medicine

## 2023-08-09 ENCOUNTER — Other Ambulatory Visit: Payer: Self-pay

## 2023-08-09 ENCOUNTER — Encounter: Payer: Self-pay | Admitting: Family Medicine

## 2023-08-09 VITALS — BP 142/80 | HR 86 | Ht 75.0 in | Wt 225.0 lb

## 2023-08-09 DIAGNOSIS — M79644 Pain in right finger(s): Secondary | ICD-10-CM

## 2023-08-09 DIAGNOSIS — E1142 Type 2 diabetes mellitus with diabetic polyneuropathy: Secondary | ICD-10-CM

## 2023-08-09 NOTE — Patient Instructions (Addendum)
 Thank you for coming in today.   You received an injection today. Seek immediate medical attention if the joint becomes red, extremely painful, or is oozing fluid.

## 2023-08-12 ENCOUNTER — Other Ambulatory Visit (HOSPITAL_COMMUNITY): Payer: Self-pay

## 2023-08-12 ENCOUNTER — Other Ambulatory Visit: Payer: Self-pay

## 2023-08-12 ENCOUNTER — Other Ambulatory Visit: Payer: Self-pay | Admitting: Family Medicine

## 2023-08-12 MED ORDER — METFORMIN HCL ER 500 MG PO TB24
1000.0000 mg | ORAL_TABLET | Freq: Two times a day (BID) | ORAL | 0 refills | Status: DC
  Filled 2023-08-12: qty 360, 90d supply, fill #0

## 2023-08-13 ENCOUNTER — Other Ambulatory Visit (HOSPITAL_COMMUNITY): Payer: Self-pay

## 2023-08-27 ENCOUNTER — Ambulatory Visit
Admission: RE | Admit: 2023-08-27 | Discharge: 2023-08-27 | Disposition: A | Discharge status: Home / Self Care | Payer: Medicare Other | Source: Ambulatory Visit | Attending: Physician Assistant

## 2023-08-27 ENCOUNTER — Ambulatory Visit: Payer: Medicare Other

## 2023-08-27 ENCOUNTER — Other Ambulatory Visit (HOSPITAL_COMMUNITY): Payer: Self-pay

## 2023-08-27 ENCOUNTER — Other Ambulatory Visit: Payer: Self-pay

## 2023-08-27 VITALS — BP 136/81 | HR 92 | Temp 98.2°F | Resp 18

## 2023-08-27 DIAGNOSIS — J0100 Acute maxillary sinusitis, unspecified: Secondary | ICD-10-CM | POA: Diagnosis not present

## 2023-08-27 MED ORDER — CEPHALEXIN 500 MG PO CAPS
500.0000 mg | ORAL_CAPSULE | Freq: Four times a day (QID) | ORAL | 0 refills | Status: DC
  Filled 2023-08-27: qty 20, 5d supply, fill #0

## 2023-08-27 NOTE — ED Triage Notes (Signed)
Pt is here with a sinus infection for 2-3 weeks but the last 3 days has been a lot. Pt states he has a hx of sinuses and pt has taken OTC meds to relieve discomfort.

## 2023-08-27 NOTE — ED Provider Notes (Signed)
[facility]    CSN: [id number] Arrival date & time: 08/27/23  4098      History   Chief Complaint Chief Complaint  Patient presents with   Sinues    HPI Luis Fernandez is a 78 y.o. male.   Patient here concerned with "sinus infection" x 2 - 3 weeks.  He reports it initially improved, now worsened again.  He states he's taking OTC medications w/o relief.  Requesting keflex.    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   Acute recurrent frontal sinusitis 04/21/2022   Motion sickness 01/19/2022   OSA on CPAP 08/19/2021   PAD (peripheral artery disease) (HCC) 08/19/2021   COVID-19 08/19/2021   Hamstring tendinitis at origin 07/04/2021   Bilateral hip pain 01/12/2021   Recurrent sinusitis 01/10/2020   Paresthesia 06/23/2016   OSA (obstructive sleep apnea) 06/23/2016   B12 deficiency 01/21/2016   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   NOSE SURGERY     NOSE SURGERY     bone spurs   TOTAL KNEE ARTHROPLASTY     UMBILICAL HERNIA REPAIR         Home Medications     Prior to Admission medications   Medication Sig Start Date End Date Taking? Authorizing Provider  cephALEXin (KEFLEX) 500 MG capsule Take 1 capsule (500 mg total) by mouth 4 (four) times daily. 08/27/23  Yes Evern Core, PA-C  Accu-Chek Softclix Lancets lancets Use to test blood sugar up to 3 times daily 05/06/23   Jeani Sow, MD  aspirin EC 81 MG tablet Take 81 mg by mouth daily. Swallow whole.    [provider]  Cyanocobalamin (VITAMIN B12 PO) Take by mouth.    [provider]  dapagliflozin propanediol (FARXIGA) 10 MG TABS tablet Take 1 tablet (10 mg total) by mouth daily. 04/01/23   Jeani Sow, MD  Dulaglutide (TRULICITY) 3 MG/0.5ML SOAJ Inject 3 mg into the skin once a week. 04/01/23   Jeani Sow, MD  ezetimibe (ZETIA) 10 MG tablet Take 1 tablet (10 mg total) by mouth daily. 06/21/23   Jeani Sow, MD  fenofibrate (TRICOR) 145 MG tablet Take 1 tablet (145 mg total) by mouth daily. 01/05/23   Jeani Sow, MD  Glucosamine 500 MG CAPS Take by mouth.    [provider]  glucose blood test strip Use to test blood sugar up to 3 times daily 06/21/23   Jeani Sow, MD  ibuprofen (ADVIL) 600 MG  tablet Take 1 tablet (600 mg total) by mouth every 8 (eight) hours as needed. 04/01/23   Jeani Sow, MD  LORazepam (ATIVAN) 0.5 MG tablet Take 1 tablet (0.5 mg total) by mouth daily. 04/06/23   Jeani Sow, MD  losartan (COZAAR) 50 MG tablet Take 1 tablet (50 mg total) by mouth daily. 06/21/23   Jeani Sow, MD  MELATONIN PO Take by mouth.    [provider]  metFORMIN (GLUCOPHAGE-XR) 500 MG 24 hr tablet Take 2 tablets (1,000 mg total) by mouth 2 (two) times daily. 08/12/23   Jeani Sow, MD  montelukast (SINGULAIR) 10 MG tablet Take 1 tablet (10 mg total) by mouth at bedtime. 06/21/23   Jeani Sow, MD  Multiple Vitamins-Minerals (MULTIVITAMIN PO) Take by mouth.    [provider]  Omega-3 Fatty Acids (FISH  OIL PO) Take by mouth.    [provider]  temazepam (RESTORIL) 30 MG capsule Take 1 capsule (30 mg total) by mouth at bedtime. 07/02/23   Jeani Sow, MD    Family History Family History  Problem Relation Age of Onset   Heart disease Mother    Hypertension Mother    Parkinson's disease Father    Dementia Brother    Hypertension Brother    Depression Brother    COPD Brother    Colon cancer Neg Hx    Stomach cancer Neg Hx    Rectal cancer Neg Hx    Pancreatic cancer Neg Hx    Esophageal cancer Neg Hx     Social History Social History   Tobacco Use   Smoking status: Former    Current packs/day: 0.00    Average packs/day: 1.5 packs/day for 10.0 years (15.0 ttl pk-yrs)    Types: Cigarettes, Pipe    Start date: 08/17/1966    Quit date: 08/17/1976    Years since quitting: 47.0   Smokeless tobacco: Never  Vaping Use   Vaping status: Never Used  Substance Use Topics   Alcohol use: Yes    Alcohol/week: 1.0 standard drink of alcohol    Types: 1 Cans of beer per week    Comment: 2-3 beers a week, occasionally/glass of wine 2 times weekly   Drug use: Never     Allergies   Prednisone   Review of Systems Review of Systems  Constitutional:  Negative for chills and fever.  HENT:  Positive for congestion, postnasal drip, rhinorrhea and sinus pressure. Negative for ear pain, nosebleeds, sneezing and sore throat.   Eyes:  Negative for pain and visual disturbance.  Respiratory:  Negative for cough, shortness of breath and wheezing.   Cardiovascular:  Negative for chest pain and palpitations.  Gastrointestinal:  Negative for abdominal pain and vomiting.  Genitourinary:  Negative for dysuria and hematuria.  Musculoskeletal:  Negative for arthralgias and back pain.  Skin:  Negative for color change and rash.  Neurological:  Negative for seizures and syncope.  All other systems reviewed and are negative.    Physical Exam Triage Vital Signs ED Triage Vitals   Encounter Vitals Group     BP 08/27/23 0957 136/81     Systolic BP Percentile --      Diastolic BP Percentile --      Pulse Rate 08/27/23 0957 92     Resp 08/27/23 0957 18     Temp 08/27/23 0957 98.2 F (36.8 C)     Temp src --      SpO2 08/27/23 0957  95 %     Weight --      Height --      Head Circumference --      Peak Flow --      Pain Score 08/27/23 0956 0     Pain Loc --      Pain Education --      Exclude from Growth Chart --    No data found.  Updated Vital Signs BP 136/81 (BP Location: Left Arm)   Pulse 92   Temp 98.2 F (36.8 C)   Resp 18   SpO2 95%   Visual Acuity Right Eye Distance:   Left Eye Distance:   Bilateral Distance:    Right Eye Near:   Left Eye Near:    Bilateral Near:     Physical Exam Vitals and nursing note reviewed.  Constitutional:      General: He is not in acute distress.    Appearance: Normal appearance. He is well-developed.  HENT:     Head: Normocephalic and atraumatic.     Nose: Mucosal edema, congestion and rhinorrhea present. Rhinorrhea is purulent.     Right Sinus: Maxillary sinus tenderness present. No frontal sinus tenderness.     Left Sinus: Maxillary sinus tenderness and frontal sinus tenderness present.     Mouth/Throat:     Mouth: Mucous membranes are moist.     Pharynx: Oropharynx is clear. Uvula midline. No posterior oropharyngeal erythema.  Eyes:     General: No scleral icterus.    Extraocular Movements: Extraocular movements intact.     Conjunctiva/sclera: Conjunctivae normal.  Cardiovascular:     Rate and Rhythm: Normal rate and regular rhythm.     Heart sounds: No murmur heard. Pulmonary:     Effort: Pulmonary effort is normal. No respiratory distress.     Breath sounds: Normal breath sounds.  Abdominal:     Palpations: Abdomen is soft.     Tenderness: There is no abdominal tenderness.  Musculoskeletal:        General: No swelling.     Cervical back: Normal range of motion and neck supple. No rigidity.   Lymphadenopathy:     Cervical: No cervical adenopathy.  Skin:    General: Skin is warm and dry.     Capillary Refill: Capillary refill takes less than 2 seconds.  Neurological:     General: No focal deficit present.     Mental Status: He is alert and oriented to person, place, and time.     Gait: Gait normal.  Psychiatric:        Mood and Affect: Mood normal.      UC Treatments / Results  Labs (all labs ordered are listed, but only abnormal results are displayed) Labs Reviewed - No data to display  EKG   Radiology No results found.  Procedures Procedures (including critical care time)  Medications Ordered in UC Medications - No data to display  Initial Impression / Assessment and Plan / UC Course  I have reviewed the triage vital signs and the nursing notes.  Pertinent labs & imaging results that were available during my care of the patient were reviewed by me and considered in my medical decision making (see chart for details).     Follow up with ENT due to recurrent sinus infections. Final Clinical Impressions(s) / UC Diagnoses   Final diagnoses:  Acute non-recurrent maxillary sinusitis   Discharge Instructions   None    ED Prescriptions     Medication Sig  Dispense Auth. Provider   cephALEXin (KEFLEX) 500 MG capsule Take 1 capsule (500 mg total) by mouth 4 (four) times daily. 20 capsule Evern Core, PA-C      PDMP not reviewed this encounter.   Evern Core, PA-C 08/27/23 1026

## 2023-08-29 MED FILL — Lorazepam Tab 0.5 MG: ORAL | 30 days supply | Qty: 30 | Fill #4 | Status: AC

## 2023-08-30 ENCOUNTER — Other Ambulatory Visit: Payer: Self-pay

## 2023-09-02 ENCOUNTER — Encounter: Payer: Self-pay | Admitting: Neurology

## 2023-09-02 ENCOUNTER — Ambulatory Visit: Payer: Medicare Other | Admitting: Neurology

## 2023-09-02 VITALS — BP 146/86 | HR 106 | Ht 74.0 in | Wt 223.6 lb

## 2023-09-02 DIAGNOSIS — H9113 Presbycusis, bilateral: Secondary | ICD-10-CM | POA: Diagnosis not present

## 2023-09-02 DIAGNOSIS — I152 Hypertension secondary to endocrine disorders: Secondary | ICD-10-CM | POA: Diagnosis not present

## 2023-09-02 DIAGNOSIS — G4733 Obstructive sleep apnea (adult) (pediatric): Secondary | ICD-10-CM | POA: Diagnosis not present

## 2023-09-02 DIAGNOSIS — E1159 Type 2 diabetes mellitus with other circulatory complications: Secondary | ICD-10-CM

## 2023-09-02 NOTE — Progress Notes (Signed)
Provider:  Melvyn Novas, MD  Primary Care Physician:  Jeani Sow, MD [address] 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  Here for revisit 09/02/2023 to be seen for CPAP compliance.   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  She has recovered since then.  He has remained a compliant CPAP user.   His Epworth sleepiness score is endorsed at 3 out of 24 points so there is no reason to consult of concern of hypersomnia.  His fatigue score is 17 out of 63 which is actually a rare low score in the patient in his late 41s.  He also endorsed the geriatric depression score today at 3 out of 15 points which is not indicative of clinical depression.     His compliance is excellent: The patient uses the machine 30 out of 30 days each of these days over 7 hours consecutively the minimum pressure is 6 the maximum pressure 14 cm water was 2 cm EPR and his residual AHI is 2.8/h there are a few centrals most of sleep remaining apneas are obstructive he does have a high air leak and his 95th percentile pressure is 12.7 cm water.  I would be happy to increase the maximum pressure by 1 cm but it does not need to be done.   08-19-2021:  Mr. Niehoff underwent testing by home sleep test device on 01 May 2021 his apnea was confirmed it was actually still severe with an AHI of 47.8.  It was a little stronger during non-REM sleep than during REM sleep.  There was a strong positional  dependence as well the AHI in supine was 48 and in prone sleep 13.9/h.  The patient had only a moderate volume of snoring 41 dB and snoring accompanied only about 20% of total access the situation.  The 95th percentile has remained at 11.5 cmH2O very close to the previous settings.  His air leak at the 95th percentile is high 36.7 L/min.  The patient does have facial hair.  4 minutes were spent in oxygen desaturation which is clinically not significant.  Heart rate varied between 51 and 90 bpm, he was a previous user of 11 cm water pressure CPAP was to his pressure CPAP was 2 cm EPR I ordered for him to receive a new machine with an autotitration range between 6 and 14 cmH2O pressure also was 2 cm water expiratory pressure relief, heated humidification and a mask of the patient's choice. He chose a nasal pillow.  He reports he is completely using up all water from the small water chamber of the Air Sense 11.   The patient left for [place] between the 14th and 27 October unfortunately even as a fully vaccinated patient he contracted COVID there.  He has been able to  use the new CPAP compliantly ever since, is using it on average 8 hours and 9 minutes a day 100% compliant by days and hours.  Settings as quoted above residual AHI is 4.5.  There is an unknown number of apneas 0.9/h that he have to deduct from his AHI these are events that occur when there is a high air leak and the machine cannot      Review of Systems: Out of a complete 14 system review, the patient complains of only the following symptoms, and all other reviewed systems are negative.:  Fatigue, sleepiness , snoring, fragmented sleep, Insomnia,  How likely are you to doze in the following situations: 0 = not likely, 1 = slight chance, 2 = moderate chance, 3 = high chance   Sitting and Reading? Watching Television? Sitting inactive in a public place (theater or meeting)? As a passenger in a car for an hour without a break? Lying down in  the afternoon when circumstances permit? Sitting and talking to someone? Sitting quietly after lunch without alcohol? In a car, while stopped for a few minutes in traffic?   Total = 3/ 24 points   FSS endorsed at 11/ 63 points.   GDS : 3/ 15    Social History   Socioeconomic History   Marital status: Married    Spouse name: Johnny Bridge   Number of children: 1   Years of education: 19   Highest education level: Professional school degree (e.g., MD, DDS, DVM, JD)  Occupational History   Occupation: Lawyer    Comment: retired  Tobacco Use   Smoking status: Former    Current packs/day: 0.00    Average packs/day: 1.5 packs/day for 10.0 years (15.0 ttl pk-yrs)    Types: Cigarettes, Pipe    Start date: 08/17/1966    Quit date: 08/17/1976    Years since quitting: 47.0   Smokeless tobacco: Never  Vaping Use   Vaping status: Never Used  Substance and Sexual Activity   Alcohol use: Yes    Alcohol/week: 1.0 standard drink of alcohol    Types: 1 Glasses of wine per week    Comment: occ glass of wine   Drug use: Never   Sexual activity: Yes  Other Topics Concern   Not on file  Social History Narrative   Lives with spouse at home. He has one child-3 grands. He enjoys reading, travelling and gardening.   Retired judge   Social Drivers of Corporate investment banker Strain: Low Risk  (06/03/2023)   Overall Financial Resource Strain (CARDIA)    Difficulty of Paying Living Expenses: Not hard at all  Food Insecurity: No Food Insecurity (06/03/2023)   Hunger Vital Sign    Worried About Running Out of Food in the Last Year: Never true    Ran Out of Food in the Last Year: Never true  Transportation Needs: No Transportation Needs (06/03/2023)   PRAPARE - Administrator, Civil Service (Medical): No    Lack of Transportation (Non-Medical): No  Physical Activity: Sufficiently Active (06/03/2023)   Exercise Vital Sign    Days of Exercise per Week: 6 days    Minutes of Exercise  per Session: 40 min  Stress: Stress Concern Present (06/03/2023)   [employer] of Occupational Health - Occupational Stress Questionnaire    Feeling of Stress : To some extent  Social Connections: Moderately Integrated (06/03/2023)   Social Connection and Isolation Panel [NHANES]    Frequency of Communication with Friends  and Family: Three times a week    Frequency of Social Gatherings with Friends and Family: Once a week    Attends Religious Services: Never    Database administrator or Organizations: Yes    Attends Engineer, structural: More than 4 times per year    Marital Status: Married    Family History  Problem Relation Age of Onset   Heart disease Mother    Hypertension Mother    Parkinson's disease Father    Dementia Brother    Hypertension Brother    Depression Brother    COPD Brother    Colon cancer Neg Hx    Stomach cancer Neg Hx    Rectal cancer Neg Hx    Pancreatic cancer Neg Hx    Esophageal cancer Neg Hx    Sleep apnea Neg Hx     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   NOSE SURGERY     NOSE SURGERY     bone spurs   TOTAL KNEE ARTHROPLASTY     UMBILICAL HERNIA REPAIR       Current Outpatient Medications on File Prior to Visit  Medication Sig Dispense Refill   Accu-Chek Softclix Lancets lancets Use to test blood sugar up to 3 times daily 100 each 3   aspirin  EC 81 MG tablet Take 81 mg by mouth daily. Swallow whole.     Cyanocobalamin (VITAMIN B12 PO) Take by mouth.     dapagliflozin propanediol (FARXIGA) 10 MG TABS tablet Take 1 tablet (10 mg total) by mouth daily. 90 tablet 3   Dulaglutide (TRULICITY) 3 MG/0.5ML SOAJ Inject 3 mg into the skin once a week. 6 mL 1   ezetimibe (ZETIA) 10 MG tablet Take 1 tablet (10 mg total) by mouth daily. 90 tablet 3   fenofibrate (TRICOR) 145 MG tablet Take 1 tablet (145 mg total) by mouth daily. 90 tablet 3   Glucosamine 500 MG CAPS Take by mouth.     glucose blood test strip Use to test blood sugar up to 3 times daily 100 each 3   ibuprofen (ADVIL) 600 MG tablet Take 1 tablet (600 mg total) by mouth every 8 (eight) hours as needed. 270 tablet 3   LORazepam (ATIVAN) 0.5 MG tablet Take 1 tablet (0.5 mg total) by mouth daily. 30 tablet 5   losartan (COZAAR) 50 MG tablet Take 1 tablet (50 mg total) by mouth daily. 90 tablet 1   MELATONIN PO Take by mouth.     metFORMIN (GLUCOPHAGE-XR) 500 MG 24 hr tablet Take 2 tablets (1,000 mg total) by mouth 2 (two) times daily. 360 tablet 0   montelukast (SINGULAIR) 10 MG tablet Take 1 tablet (10 mg total) by mouth at bedtime. 90 tablet 2   Multiple Vitamins-Minerals (MULTIVITAMIN  PO) Take by mouth.     Omega-3 Fatty Acids (FISH OIL PO) Take by mouth.     temazepam (RESTORIL) 30 MG capsule Take 1 capsule (30 mg total) by mouth at bedtime. 30 capsule 3   No current facility-administered medications on file prior to visit.    Allergies  Allergen Reactions   Prednisone     Intolerance      DIAGNOSTIC DATA (LABS, IMAGING, TESTING) - I reviewed patient records, labs, notes, testing and imaging myself where available.  Lab Results  Component Value Date   WBC 4.9 09/30/2022   HGB 15.7 09/30/2022   HCT 45.2 09/30/2022   MCV 95.8 09/30/2022   PLT 269.0 09/30/2022      Component Value Date/Time   NA 141 04/01/2023 1137   NA 138 06/13/2015 0000   K 4.2 04/01/2023 1137    CL 107 04/01/2023 1137   CO2 26 04/01/2023 1137   GLUCOSE 114 (H) 04/01/2023 1137   BUN 22 04/01/2023 1137   CREATININE 0.86 04/01/2023 1137   CREATININE 0.90 01/19/2022 0000   CALCIUM 9.9 04/01/2023 1137   PROT 7.0 04/01/2023 1137   ALBUMIN 4.5 04/01/2023 1137   ALBUMIN 3.8 03/07/2015 0000   AST 27 04/01/2023 1137   ALT 31 04/01/2023 1137   ALKPHOS 37 (L) 04/01/2023 1137   BILITOT 0.8 04/01/2023 1137   GFRNONAA 79 01/09/2021 0000   GFRAA 92 01/09/2021 0000   Lab Results  Component Value Date   CHOL 169 04/01/2023   HDL 33.70 (L) 04/01/2023   LDLCALC 94 01/19/2022   LDLDIRECT 129.0 04/01/2023   TRIG 258.0 (H) 04/01/2023   CHOLHDL 5 04/01/2023   Lab Results  Component Value Date   HGBA1C 6.6 (H) 04/01/2023   Lab Results  Component Value Date   VITAMINB12 >1501 (H) 04/01/2023   Lab Results  Component Value Date   TSH 0.61 09/30/2022    PHYSICAL EXAM:  Today's Vitals   09/02/23 1449  BP: (!) 146/86  Pulse: (!) 106  Weight: 223 lb 9.6 oz (101.4 kg)  Height: 6\' 2"  (1.88 m)   Body mass index is 28.71 kg/m.   Wt Readings from Last 3 Encounters:  09/02/23 223 lb 9.6 oz (101.4 kg)  08/09/23 225 lb (102.1 kg)  06/07/23 226 lb 12.8 oz (102.9 kg)     Ht Readings from Last 3 Encounters:  09/02/23 6\' 2"  (1.88 m)  08/09/23 6\' 3"  (1.905 m)  04/01/23 6\' 3"  (1.905 m)      General: The patient is awake, alert and appears not in acute distress  Facial hair, long hair.  Head: Normocephalic, atraumatic. Neck is supple. Mallampati 1,  neck circumference:18 inches . Nasal airflow barely patent.  Retrognathia is not  seen.  Dental status: biological teeth and one bridge - front teeth Cardiovascular:  Regular rate and cardiac rhythm by pulse- without distended neck veins. Respiratory: Lungs are clear to auscultation.  Skin:  Without evidence of ankle edema, or rash.  Varicose veins. Trunk: The patient's posture is erect.   Neurologic exam : The patient is awake  and alert, oriented to place and time.   Memory subjective described as intact.  Attention span & concentration ability appears normal.  Speech is fluent, without dysarthria, but with dysphonia.  Mood and affect are appropriate.   Cranial nerves: no loss of smell or taste reported  Pupils are equal and briskly reactive to light. Funduscopic exam deferred .  Extraocular movements in vertical and horizontal planes  were intact . Visual fields by finger perimetry are intact. Hearing impaired, aids in place-   Facial sensation intact to fine touch.  Facial motor strength is symmetric and tongue and uvula move midline.  Neck ROM : rotation, tilt and flexion extension were normal for age and shoulder shrug was symmetrical.    Motor exam:  Symmetric bulk, tone and ROM.   Sensory: vibration was normal.  Proprioception tested in the upper extremities was normal.  Coordination: Tremor bilaterally seen with action not at rest, low amplitude.   Gait and station: Patient could rise unassisted from a seated position, walked without assistive device.  Stance is of normal width/ base and the patient turned with 3 steps.  Toe and heel walk were deferred.  Deep tendon reflexes: in the  upper and lower extremities are intact.  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  Right hip pain gives him ROM restriction. Babinski response was deferred .  ASSESSMENT AND PLAN 78 y.o. year old male  here with:  high compliance on auto PAP, residual AHI is 4.7/h and 6-14 cm water with 2 cm EPR.     1) high air leak and likely related to facial hair. Water in the hose but water chamber is empty in AM. Mask is comfy. He uses a heated hose.   2)  reduce the humidifier setting today , I switched form level 5/8 to level 3/ 8      Rv in 12 months.       I would like to thank Jeani Sow, MD for allowing me to meet with and to take care of this pleasant patient.   CC: I will share my notes with .  After  spending a total time of  35  minutes face to face and additional time for physical and neurologic examination, review of laboratory studies,  personal review of imaging studies, reports and results of other testing and review of referral information / records as far as provided in visit,   Electronically signed by: Melvyn Novas, MD 09/02/2023 3:14 PM  [facility] Sleep Board certified by The [employer] of Sleep Medicine and Diplomate of the [employer] of Sleep Medicine. Board certified In Neurology through the ABPN, Fellow of the [employer] of Neurology.

## 2023-09-02 NOTE — Progress Notes (Signed)
Marland Kitchen

## 2023-09-03 ENCOUNTER — Other Ambulatory Visit (HOSPITAL_COMMUNITY): Payer: Self-pay

## 2023-09-03 ENCOUNTER — Other Ambulatory Visit: Payer: Self-pay

## 2023-09-04 ENCOUNTER — Other Ambulatory Visit (HOSPITAL_COMMUNITY): Payer: Self-pay

## 2023-09-09 ENCOUNTER — Other Ambulatory Visit: Payer: Self-pay

## 2023-09-10 ENCOUNTER — Telehealth: Payer: Self-pay

## 2023-09-10 ENCOUNTER — Other Ambulatory Visit (HOSPITAL_COMMUNITY): Payer: Self-pay

## 2023-09-10 MED ORDER — LIRAGLUTIDE 18 MG/3ML ~~LOC~~ SOPN
PEN_INJECTOR | SUBCUTANEOUS | 0 refills | Status: DC
  Filled 2023-09-10: qty 9, 28d supply, fill #0

## 2023-09-10 NOTE — Telephone Encounter (Signed)
 Copied from CRM [id number]. Topic: General - Other >> Sep 10, 2023  9:26 AM Pinkey ORN wrote: Reason for CRM: [facility] >> Sep 10, 2023  9:28 AM Pinkey ORN wrote: [place] [facility] [phone]  Calling on behalf of the prior authorization, needing to know the diagnosis   Spoke to Brittany and she stated that PA was for Temazepam  and was denied, she is faxing over information.

## 2023-09-10 NOTE — Telephone Encounter (Signed)
 Received additional questions regarding PA started for Temazepam  30 mg via Onbase, answered questions and faxed to [phone]

## 2023-09-13 NOTE — Telephone Encounter (Signed)
 Copied from CRM [id number]. Topic: General - Other >> Sep 13, 2023  2:04 PM Adonis Hoot wrote: Reason for CRM:[facility] coverage and determination department called in stating that the information was received however the  diagnosis was not provided along with some other information,so the document will be resent back to office  Fax#:[phone]  Noted.

## 2023-09-18 ENCOUNTER — Other Ambulatory Visit (HOSPITAL_COMMUNITY): Payer: Self-pay

## 2023-09-20 NOTE — Telephone Encounter (Signed)
 FYI. Noted

## 2023-09-20 NOTE — Telephone Encounter (Signed)
 Pharmacy Patient Advocate Encounter  Received notification from [employer] that Prior Authorization for Temazepam 30 mg  has been DENIED.  Full denial letter will be uploaded to the media tab. See denial reason below.

## 2023-09-21 ENCOUNTER — Encounter: Payer: Self-pay | Admitting: *Deleted

## 2023-09-21 NOTE — Telephone Encounter (Signed)
 Mychart message sent to patient notifying of message below.

## 2023-09-28 ENCOUNTER — Other Ambulatory Visit: Payer: Self-pay | Admitting: Family Medicine

## 2023-09-28 ENCOUNTER — Other Ambulatory Visit: Payer: Self-pay

## 2023-09-28 ENCOUNTER — Other Ambulatory Visit (HOSPITAL_COMMUNITY): Payer: Self-pay

## 2023-09-28 MED ORDER — LORAZEPAM 0.5 MG PO TABS
0.5000 mg | ORAL_TABLET | Freq: Every day | ORAL | 0 refills | Status: DC
  Filled 2023-09-28: qty 30, 30d supply, fill #0

## 2023-09-29 ENCOUNTER — Other Ambulatory Visit (HOSPITAL_COMMUNITY): Payer: Self-pay

## 2023-10-05 ENCOUNTER — Ambulatory Visit: Payer: Medicare Other | Admitting: Family Medicine

## 2023-10-05 ENCOUNTER — Encounter: Payer: Self-pay | Admitting: Family Medicine

## 2023-10-05 ENCOUNTER — Other Ambulatory Visit (HOSPITAL_COMMUNITY): Payer: Self-pay

## 2023-10-05 VITALS — BP 127/77 | HR 77 | Temp 97.7°F | Resp 18 | Ht 74.0 in | Wt 222.0 lb

## 2023-10-05 DIAGNOSIS — E538 Deficiency of other specified B group vitamins: Secondary | ICD-10-CM

## 2023-10-05 DIAGNOSIS — G720 Drug-induced myopathy: Secondary | ICD-10-CM | POA: Diagnosis not present

## 2023-10-05 DIAGNOSIS — E1159 Type 2 diabetes mellitus with other circulatory complications: Secondary | ICD-10-CM

## 2023-10-05 DIAGNOSIS — I152 Hypertension secondary to endocrine disorders: Secondary | ICD-10-CM

## 2023-10-05 DIAGNOSIS — I739 Peripheral vascular disease, unspecified: Secondary | ICD-10-CM

## 2023-10-05 DIAGNOSIS — E785 Hyperlipidemia, unspecified: Secondary | ICD-10-CM

## 2023-10-05 DIAGNOSIS — E1142 Type 2 diabetes mellitus with diabetic polyneuropathy: Secondary | ICD-10-CM | POA: Diagnosis not present

## 2023-10-05 DIAGNOSIS — F5101 Primary insomnia: Secondary | ICD-10-CM

## 2023-10-05 DIAGNOSIS — T466X5A Adverse effect of antihyperlipidemic and antiarteriosclerotic drugs, initial encounter: Secondary | ICD-10-CM

## 2023-10-05 DIAGNOSIS — Z7984 Long term (current) use of oral hypoglycemic drugs: Secondary | ICD-10-CM

## 2023-10-05 LAB — HEMOGLOBIN A1C: Hgb A1c MFr Bld: 6.6 % — ABNORMAL HIGH (ref 4.6–6.5)

## 2023-10-05 LAB — LIPID PANEL
Cholesterol: 172 mg/dL (ref 0–200)
HDL: 35.3 mg/dL — ABNORMAL LOW (ref >39.00)
LDL Cholesterol: 99 mg/dL (ref 0–99)
NonHDL: 136.88
Total CHOL/HDL Ratio: 5
Triglycerides: 191 mg/dL — ABNORMAL HIGH (ref 0.0–149.0)
VLDL: 38.2 mg/dL (ref 0.0–40.0)

## 2023-10-05 LAB — CBC WITH DIFFERENTIAL/PLATELET
Basophils Absolute: 0 10*3/uL (ref 0.0–0.1)
Basophils Relative: 0.8 % (ref 0.0–3.0)
Eosinophils Absolute: 0.2 10*3/uL (ref 0.0–0.7)
Eosinophils Relative: 3.6 % (ref 0.0–5.0)
HCT: 47.1 % (ref 39.0–52.0)
Hemoglobin: 15.9 g/dL (ref 13.0–17.0)
Lymphocytes Relative: 33.2 % (ref 12.0–46.0)
Lymphs Abs: 1.5 10*3/uL (ref 0.7–4.0)
MCHC: 33.7 g/dL (ref 30.0–36.0)
MCV: 97.2 fl (ref 78.0–100.0)
Monocytes Absolute: 0.4 10*3/uL (ref 0.1–1.0)
Monocytes Relative: 8.4 % (ref 3.0–12.0)
Neutro Abs: 2.5 10*3/uL (ref 1.4–7.7)
Neutrophils Relative %: 54 % (ref 43.0–77.0)
Platelets: 293 10*3/uL (ref 150.0–400.0)
RBC: 4.85 Mil/uL (ref 4.22–5.81)
RDW: 13.7 % (ref 11.5–15.5)
WBC: 4.7 10*3/uL (ref 4.0–10.5)

## 2023-10-05 LAB — COMPREHENSIVE METABOLIC PANEL
ALT: 27 U/L (ref 0–53)
AST: 27 U/L (ref 0–37)
Albumin: 4.6 g/dL (ref 3.5–5.2)
Alkaline Phosphatase: 35 U/L — ABNORMAL LOW (ref 39–117)
BUN: 25 mg/dL — ABNORMAL HIGH (ref 6–23)
CO2: 24 meq/L (ref 19–32)
Calcium: 10 mg/dL (ref 8.4–10.5)
Chloride: 108 meq/L (ref 96–112)
Creatinine, Ser: 0.76 mg/dL (ref 0.40–1.50)
GFR: 85.96 mL/min (ref >60.00)
Glucose, Bld: 123 mg/dL — ABNORMAL HIGH (ref 70–99)
Potassium: 4.3 meq/L (ref 3.5–5.1)
Sodium: 142 meq/L (ref 135–145)
Total Bilirubin: 0.7 mg/dL (ref 0.2–1.2)
Total Protein: 7.2 g/dL (ref 6.0–8.3)

## 2023-10-05 LAB — MICROALBUMIN / CREATININE URINE RATIO
Creatinine,U: 78.5 mg/dL
Microalb Creat Ratio: 8.9 mg/g (ref 0.0–30.0)
Microalb, Ur: <0.7 mg/dL (ref 0.0–1.9)

## 2023-10-05 LAB — VITAMIN B12: Vitamin B-12: >1537 pg/mL — ABNORMAL HIGH (ref 211–911)

## 2023-10-05 MED ORDER — TEMAZEPAM 15 MG PO CAPS
15.0000 mg | ORAL_CAPSULE | Freq: Every evening | ORAL | 0 refills | Status: DC | PRN
  Filled 2023-10-05: qty 30, 30d supply, fill #0

## 2023-10-05 MED ORDER — TRULICITY 3 MG/0.5ML ~~LOC~~ SOAJ
3.0000 mg | SUBCUTANEOUS | 1 refills | Status: DC
  Filled 2023-10-05: qty 2, 28d supply, fill #0
  Filled 2023-11-08: qty 2, 28d supply, fill #1
  Filled 2023-12-08: qty 2, 28d supply, fill #2
  Filled 2024-01-02 – 2024-01-03 (×2): qty 2, 28d supply, fill #3
  Filled 2024-02-02: qty 2, 28d supply, fill #4
  Filled 2024-02-28: qty 2, 28d supply, fill #5

## 2023-10-05 MED ORDER — METFORMIN HCL ER 500 MG PO TB24
1000.0000 mg | ORAL_TABLET | Freq: Two times a day (BID) | ORAL | 1 refills | Status: DC
  Filled 2023-10-05 – 2023-11-08 (×2): qty 360, 90d supply, fill #0
  Filled 2024-02-07: qty 360, 90d supply, fill #1

## 2023-10-05 MED ORDER — FENOFIBRATE 145 MG PO TABS
145.0000 mg | ORAL_TABLET | Freq: Every day | ORAL | 3 refills | Status: AC
  Filled 2023-10-05 – 2023-11-08 (×2): qty 90, 90d supply, fill #0
  Filled 2024-02-07: qty 90, 90d supply, fill #1
  Filled 2024-05-12: qty 90, 90d supply, fill #2
  Filled 2024-08-14: qty 90, 90d supply, fill #3

## 2023-10-05 MED ORDER — LORAZEPAM 0.5 MG PO TABS
0.5000 mg | ORAL_TABLET | Freq: Every day | ORAL | 5 refills | Status: DC
  Filled 2023-10-05 – 2023-10-31 (×2): qty 30, 30d supply, fill #0
  Filled 2023-11-29: qty 30, 30d supply, fill #1
  Filled 2024-01-02 – 2024-01-03 (×2): qty 30, 30d supply, fill #2
  Filled 2024-02-03: qty 30, 30d supply, fill #3
  Filled 2024-03-14: qty 30, 30d supply, fill #4

## 2023-10-05 NOTE — Progress Notes (Signed)
 Subjective:     Patient ID: Luis Fernandez, male    DOB: 10-30-1945, 78 y.o.   MRN: [id number]  Chief Complaint  Patient presents with   Medical Management of Chronic Issues    6 month follow-up Fasting      HPI  DM type 2- His diabetes is well controlled on farxiga 10mg , trulicity 3mg , metformin 1000mg  bid. He monitors his blood sugars at home, usually 120-130. Tends to stay under 150s. He has been walking a couple of times a day, daily. Also does yard work to stay active. Chronic. Neuropathy.  Some balance issues.  HTN - Pt is on losartan 50mg  . Bp's well controlled at home, reports a reading of 110-120s/60s t. No dizziness/cp/palp/edema/cough/sob.  Chronic sinus HA  Muscle cramps(rare)  - Taking lorazepam 0.5 mg as needed to manage muscle cramps 2-3xwk.   Insomnia - Takes restoril 30 mg daily at nighttime. Sleeps well.  No SI.  Doesn't want to change.  Has never tried anything else  Falls - He reports he occasionally falls, usually due to falling over small steps in his house.   Keflex - Started taking after his knee replacement, to avoid infection. Usually takes 4 tablets of keflex and ibuprofen prior to any dental work.   He reports a hx of PAD. Taking ASA and zetia 10mg  and fenofibrate.  No symptoms.  Intol several statins.  Willing to do PSK-9 Discussed the use of AI scribe software for clinical note transcription with the patient, who gave verbal consent to proceed.  History of Present Illness   Luis Fernandez "Luis Fernandez" is a 78 year old male with diabetes and hypertension who presents for routine follow-up.  He manages his diabetes with Farxiga 10 mg, Trulicity 3 mg, and Metformin 1000 mg twice daily. Blood sugar levels range from 110 to 150 mg/dL. He maintains an active lifestyle, walking twice daily, which he believes helps control his blood sugar. He experiences numbness and tingling in his feet, attributed to diabetes, which affects his balance but not his ability to  walk. This condition has been stable over the years.  His blood pressure at home ranges from 110 to 120/60s mmHg, though it is higher during office visits. He takes Losartan 50 mg daily. No new headaches, dizziness, passing out, chest pain, or shortness of breath, with occasional headaches attributed to sinus issues.  He takes Tricor (fenofibrate) and zetia 10mg  for cholesterol management. He has not seen a lipid specialist. He believes his LDL cholesterol level of 129 mg/dL is acceptable, though he acknowledges having peripheral vascular disease in his legs.  statin myalgias  He takes Temazepam 30 mg nightly for insomnia, which he attributes to aging and possibly psychological factors. He has recently switched from Medicare to regular commercial insurance due to medication coverage issues.  He walks twice a day for about a mile each time, though not at a fast pace due to his dog's tendency to stop and sniff. He no longer experiences muscle cramps since discontinuing statins, though he occasionally takes Lorazepam two to three times a week for mild cramps when overexerting himself in the yard. He reports some right hip pain, associated with a previous hamstring problem, which is alleviated by Motrin. He sees an orthopedic specialist periodically for this issue.       There are no preventive care reminders to display for this patient.   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 Swallow whole., Disp: , Rfl:    Cyanocobalamin (VITAMIN B12 PO), Take by mouth., Disp: , Rfl:    dapagliflozin propanediol (FARXIGA) 10 MG TABS tablet, Take 1 tablet (10 mg total) by mouth daily., Disp: 90 tablet, Rfl: 3   ezetimibe (ZETIA) 10 MG tablet, Take 1 tablet (10 mg total) by mouth daily., Disp: 90 tablet, Rfl: 3   Glucosamine 500 MG CAPS, Take by mouth., Disp: , Rfl:    glucose blood test strip, Use to test blood sugar up to 3 times daily, Disp: 100 each, Rfl: 3   ibuprofen (ADVIL) 600 MG tablet, Take 1 tablet (600 mg total) by mouth every 8 (eight) hours as needed., Disp: 270 tablet, Rfl: 3   losartan (COZAAR) 50 MG tablet, Take 1 tablet (50 mg total) by mouth daily., Disp: 90 tablet, Rfl: 1   MELATONIN PO, Take by mouth., Disp: , Rfl:    montelukast (SINGULAIR) 10 MG tablet, Take 1 tablet (10 mg total) by mouth at bedtime., Disp: 90 tablet, Rfl: 2   Multiple Vitamins-Minerals (MULTIVITAMIN PO), Take by mouth., Disp: , Rfl:    Omega-3 Fatty Acids (FISH OIL PO), Take by mouth., Disp: , Rfl:    temazepam (RESTORIL) 15 MG capsule, Take 1 capsule (15 mg total) by mouth at bedtime as needed for sleep., Disp: 30 capsule, Rfl: 0   temazepam (RESTORIL) 30 MG capsule, Take 1 capsule (30 mg total) by mouth at bedtime., Disp: 30 capsule, Rfl: 3   Dulaglutide (TRULICITY) 3 MG/0.5ML SOAJ, Inject 3 mg into the skin once a week., Disp: 6 mL, Rfl: 1   fenofibrate (TRICOR) 145 MG tablet, Take 1 tablet (145 mg total) by mouth daily., Disp: 90 tablet, Rfl: 3   LORazepam (ATIVAN) 0.5 MG tablet, Take 1 tablet (0.5 mg total) by mouth daily.,  Disp: 30 tablet, Rfl: 5   metFORMIN (GLUCOPHAGE-XR) 500 MG 24 hr tablet, Take 2 tablets (1,000 mg total) by mouth 2 (two) times daily., Disp: 360 tablet, Rfl: 1  Allergies  Allergen Reactions   Prednisone     Intolerance    ROS neg/noncontributory except as noted HPI/below      Objective:     BP 127/77   Pulse 77   Temp 97.7 F (36.5 C) (Temporal)   Resp 18   Ht 6\' 2"  (1.88 m)   Wt 222 lb (100.7 kg)   SpO2 96%   BMI 28.50 kg/m  Wt Readings from  Last 3 Encounters:  10/05/23 222 lb (100.7 kg)  09/02/23 223 lb 9.6 oz (101.4 kg)  08/09/23 225 lb (102.1 kg)    Physical Exam   Gen: WDWN NAD HEENT: NCAT, conjunctiva not injected, sclera nonicteric NECK:  supple, no thyromegaly, no nodes, no carotid bruits CARDIAC: RRR, S1S2+, no murmur. DP 2+B LUNGS: CTAB. No wheezes ABDOMEN:  BS+, soft, NTND, No HSM, no masses EXT:  no edema. +Varicose veins, bilaterally  MSK: no gross abnormalities.  NEURO: A&O x3.  CN II-XII intact.  PSYCH: normal mood. Good eye contact  PDMP reviewed today, no red flags    Diabetic Foot Exam - Simple   Simple Foot Form Diabetic Foot exam was performed with the following findings: Yes 10/05/2023 10:32 AM  Visual Inspection See comments: Yes Sensation Testing See comments: Yes Pulse Check Posterior Tibialis and Dorsalis pulse intact bilaterally: Yes Comments Dec sensation distally from midfoot.  Some callus L great toe.  Dystrophic nail R great toe.         Assessment & Plan:  Type 2 diabetes mellitus with diabetic polyneuropathy, without long-term current use of insulin (HCC) -     Comprehensive metabolic panel -     Hemoglobin A1c -     Microalbumin / creatinine urine ratio -     Trulicity; Inject 3 mg into the skin once a week.  Dispense: 6 mL; Refill: 1 -     metFORMIN HCl ER; Take 2 tablets (1,000 mg total) by mouth 2 (two) times daily.  Dispense: 360 tablet; Refill: 1  Hypertension associated with diabetes (HCC) -     Comprehensive  metabolic panel -     CBC with Differential/Platelet  PAD (peripheral artery disease) (HCC)  B12 deficiency -     Vitamin B12  Hyperlipidemia LDL goal <70 -     Comprehensive metabolic panel -     Lipid panel -     Fenofibrate; Take 1 tablet (145 mg total) by mouth daily.  Dispense: 90 tablet; Refill: 3  Primary insomnia -     Temazepam; Take 1 capsule (15 mg total) by mouth at bedtime as needed for sleep.  Dispense: 30 capsule; Refill: 0  Statin myopathy  Other orders -     LORazepam; Take 1 tablet (0.5 mg total) by mouth daily.  Dispense: 30 tablet; Refill: 5  Assessment and Plan    Type 2 Diabetes Mellitus   Diabetes is well-controlled with Farxiga 10 mg, Trulicity 3 mg, and Metformin 1000 mg BID. Blood glucose levels range from 110 to 150 mg/dL. No new neuropathy symptoms; existing numbness and tingling in feet persist without worsening. He maintains physical activity and reports stable symptoms. Continue current medications, monitor blood glucose levels regularly, and encourage continued physical activity.  Peripheral Neuropathy   Secondary to diabetes, with persistent numbness and tingling in feet affecting balance but not significantly impairing walking. He is advised on foot care and regular inspection to prevent complications. Monitor for symptom changes and continue advising on foot care and regular inspection.  Hypertension   Hypertension is well-controlled with Losartan 50 mg. Home blood pressure readings range from 110/60 to 120/60 mmHg. No new symptoms such as headache, dizziness, syncope, chest pain, or dyspnea. Continue Losartan 50 mg and monitor blood pressure regularly.  Hyperlipidemia   LDL is at 129 mg/dL, above the target of <04 mg/dL for patients with diabetes and peripheral vascular disease. Discussed potential use of PCSK9 inhibitors, which are non-statin injectables that significantly lower LDL  levels and may require prior authorization. May need to Refer to  lipid clinic for further management and consider prior authorization for PCSK9 inhibitor. await labs.  continue zetia and fenofibrate  Insomnia   Chronic insomnia is managed with Temazepam 30 mg. Discussed risks of long-term use, including increased risk of falls and cognitive impairment. He is willing to try a lower dose for safety. Prescribe Temazepam 15 mg, instruct him to try the 15 mg dose and report effectiveness, and maintain the 30 mg dose as a backup if 15 mg is ineffective.  General Health Maintenance   He is up to date on vaccinations including RSV, COVID, and flu. No new vaccines are required at this time. Continue regular health maintenance and vaccinations as needed.  Follow-up   Schedule a follow-up appointment in 6 months and perform lab work today.        Return in about 6 months (around 04/06/2024) for chronic follow-up. Angelena Sole, MD

## 2023-10-05 NOTE — Progress Notes (Signed)
 Labs stable/at goal except cholesterol.  When/how did he get the diagnosis of PAD(perif arterial disease)

## 2023-10-05 NOTE — Patient Instructions (Signed)
 It was very nice to see you today!  Try 15mg  temazepam   PLEASE NOTE:  If you had any lab tests please let us know if you have not heard back within a few days. You may see your results on MyChart before we have a chance to review them but we will give you a call once they are reviewed by [place]. If we ordered any referrals today, please let us know if you have not heard from their office within the next week.   Please try these tips to maintain a healthy lifestyle:  Eat most of your calories during the day when you are active. Eliminate processed foods including packaged sweets (pies, cakes, cookies), reduce intake of potatoes, white bread, white pasta, and white rice. Look for whole grain options, oat flour or almond flour.  Each meal should contain half fruits/vegetables, one quarter protein, and one quarter carbs (no bigger than a computer mouse).  Cut down on sweet beverages. This includes juice, soda, and sweet tea. Also watch fruit intake, though this is a healthier sweet option, it still contains natural sugar! Limit to 3 servings daily.  Drink at least 1 glass of water with each meal and aim for at least 8 glasses per day  Exercise at least 150 minutes every week.

## 2023-10-07 ENCOUNTER — Encounter: Payer: Self-pay | Admitting: Family Medicine

## 2023-10-07 ENCOUNTER — Other Ambulatory Visit: Payer: Self-pay | Admitting: Family

## 2023-10-07 NOTE — Telephone Encounter (Signed)
 Noted.

## 2023-10-24 ENCOUNTER — Other Ambulatory Visit: Payer: Self-pay | Admitting: Family Medicine

## 2023-10-24 DIAGNOSIS — E1142 Type 2 diabetes mellitus with diabetic polyneuropathy: Secondary | ICD-10-CM

## 2023-10-26 ENCOUNTER — Other Ambulatory Visit (HOSPITAL_COMMUNITY): Payer: Self-pay

## 2023-10-26 ENCOUNTER — Encounter: Payer: Self-pay | Admitting: Family Medicine

## 2023-10-26 ENCOUNTER — Other Ambulatory Visit: Payer: Self-pay | Admitting: Family Medicine

## 2023-10-26 DIAGNOSIS — F5101 Primary insomnia: Secondary | ICD-10-CM

## 2023-10-26 MED ORDER — TEMAZEPAM 30 MG PO CAPS
30.0000 mg | ORAL_CAPSULE | Freq: Every evening | ORAL | 5 refills | Status: DC | PRN
  Filled 2023-10-26: qty 30, 30d supply, fill #0
  Filled 2023-11-29: qty 15, 15d supply, fill #1
  Filled 2023-12-03: qty 15, 15d supply, fill #2
  Filled 2023-12-16: qty 15, 25d supply, fill #2
  Filled 2023-12-18: qty 30, 30d supply, fill #2
  Filled 2024-01-17: qty 30, 30d supply, fill #3
  Filled 2024-02-16: qty 30, 30d supply, fill #4
  Filled 2024-03-17: qty 30, 30d supply, fill #5
  Filled 2024-04-09: qty 15, 15d supply, fill #6

## 2023-10-27 ENCOUNTER — Other Ambulatory Visit: Payer: Self-pay

## 2023-10-27 ENCOUNTER — Other Ambulatory Visit (HOSPITAL_COMMUNITY): Payer: Self-pay

## 2023-10-29 ENCOUNTER — Other Ambulatory Visit (HOSPITAL_COMMUNITY): Payer: Self-pay

## 2023-11-01 ENCOUNTER — Other Ambulatory Visit (HOSPITAL_COMMUNITY): Payer: Self-pay

## 2023-11-02 ENCOUNTER — Other Ambulatory Visit (HOSPITAL_COMMUNITY): Payer: Self-pay

## 2023-11-08 ENCOUNTER — Other Ambulatory Visit (HOSPITAL_COMMUNITY): Payer: Self-pay

## 2023-11-09 ENCOUNTER — Other Ambulatory Visit: Payer: Self-pay

## 2023-11-16 ENCOUNTER — Other Ambulatory Visit (HOSPITAL_COMMUNITY): Payer: Self-pay

## 2023-11-16 ENCOUNTER — Other Ambulatory Visit: Payer: Self-pay | Admitting: Family Medicine

## 2023-11-16 MED ORDER — CEPHALEXIN 500 MG PO CAPS
500.0000 mg | ORAL_CAPSULE | Freq: Once | ORAL | 4 refills | Status: DC
  Filled 2023-11-16: qty 8, 8d supply, fill #0
  Filled 2024-06-30: qty 8, 8d supply, fill #1

## 2023-11-28 ENCOUNTER — Other Ambulatory Visit: Payer: Self-pay | Admitting: Family Medicine

## 2023-11-28 DIAGNOSIS — E1142 Type 2 diabetes mellitus with diabetic polyneuropathy: Secondary | ICD-10-CM

## 2023-11-30 ENCOUNTER — Other Ambulatory Visit: Payer: Self-pay

## 2023-12-01 ENCOUNTER — Other Ambulatory Visit (HOSPITAL_COMMUNITY): Payer: Self-pay

## 2023-12-01 ENCOUNTER — Telehealth: Payer: Self-pay | Admitting: *Deleted

## 2023-12-01 ENCOUNTER — Telehealth: Payer: Self-pay | Admitting: Pharmacy Technician

## 2023-12-01 ENCOUNTER — Other Ambulatory Visit: Payer: Self-pay

## 2023-12-01 NOTE — Telephone Encounter (Signed)
 A user error has taken place: encounter opened in error, closed for administrative reasons, orders placed in error, not carried out on this patient.

## 2023-12-01 NOTE — Telephone Encounter (Signed)
 Pharmacy Patient Advocate Encounter   Received notification from CoverMy[employer] that prior authorization for Farxiga  10MG  tablets is required/requested.   Insurance verification completed.   The patient is insured through [facility] .   Per test claim: PA required; PA submitted to above mentioned insurance via CoverMyMeds Key/confirmation #/EOC [id number] Status is pending    Side note this new insurance prefer [employer]  and may deny the PA because of that but I will follow up as soon as [employer] respond.

## 2023-12-01 NOTE — Telephone Encounter (Signed)
 Noted.

## 2023-12-01 NOTE — Telephone Encounter (Signed)
 Copied from CRM [id number]. Topic: Referral - Prior Authorization Question >> Dec 01, 2023  2:56 PM Luane Rumps D wrote: Reason for CRM: Marla Sills [facility] customer service calling about Mr. Johal's dapagliflozin  propanediol (FARXIGA ) 10 MG TABS tablet. Stated that the medication was taken off of the formulary and patient is very upset as he was unaware of this until now and only has 3 days left of the medication. Debria Fang stated that he needs a prior authorization to stay on the medication otherwise he would need to switch to Jardiance . She requested that Mr. Lavalais receives an update regarding what Dr. Waldo Guitar decides, either to get the prior authorization or to switch to the other medication.  PA has been started. Per PA Team:   Pharmacy Patient Advocate Encounter   Received notification from CoverMyMeds that prior authorization for Farxiga  10MG  tablets is required/requested.   Insurance verification completed.   The patient is insured through [facility] .   Per test claim: PA required; PA submitted to above mentioned insurance via CoverMyMeds Key/confirmation #/EOC [id number] Status is pending      Side note this new insurance prefer [employer]  and may deny the PA because of that but I will follow up as soon as [employer] respond.

## 2023-12-02 ENCOUNTER — Other Ambulatory Visit (HOSPITAL_COMMUNITY): Payer: Self-pay

## 2023-12-02 NOTE — Telephone Encounter (Signed)
 Left message to return my call.

## 2023-12-02 NOTE — Telephone Encounter (Signed)
 Please see messages below and advise.   Reason for CRM: Marla Sills [facility] customer [facility] calling about Luis Fernandez's dapagliflozin  propanediol (FARXIGA ) 10 MG TABS tablet. Stated that the medication was taken off of the formulary and patient is very upset as he was unaware of this until now and only has 3 days left of the medication. Debria Fang stated that he needs a prior authorization to stay on the medication otherwise he would need to switch to Jardiance . She requested that Luis Fernandez receives an update regarding what Dr. Waldo Guitar decides, either to get the prior authorization or to switch to the other medication.   PA has been started. Per PA Team:    Pharmacy Patient Advocate Encounter   Received notification from CoverMyMeds that prior authorization for Farxiga  10MG  tablets is required/requested.   Insurance verification completed.   The patient is insured through [facility] .   Per test claim: PA required; PA submitted to above mentioned insurance via CoverMyMeds Key/confirmation #/EOC [id number] Status is pending      Side note this new insurance prefer [employer]  and may deny the PA because of that but I will follow up as soon as [employer] respond.

## 2023-12-02 NOTE — Telephone Encounter (Signed)
 Good morning, Luis Fernandez insurance sent back this question in regards to the PA for Farxiga :

## 2023-12-02 NOTE — Telephone Encounter (Signed)
 Spoke to patient and he has tried Jardiance , has been on Farxiga  for the last 5 years, prefers to stay on Farxiga .

## 2023-12-03 ENCOUNTER — Other Ambulatory Visit (HOSPITAL_COMMUNITY): Payer: Self-pay

## 2023-12-03 NOTE — Telephone Encounter (Signed)
 Will do!

## 2023-12-03 NOTE — Telephone Encounter (Signed)
 Pharmacy Patient Advocate Encounter   Received notification from CoverMyMeds that prior authorization for FARXIGA  10MG  TABLETS is required/requested.   Insurance verification completed.   The patient is insured through [facility] .   Per test claim: PA required; PA submitted to above mentioned insurance via CoverMyMeds Key/confirmation #/EOC [id number]  Status is pending

## 2023-12-03 NOTE — Telephone Encounter (Signed)
 Patient stated he doesn't recall any side effects from the Jardiance , he does not recall why he was changed over from the Jardiance , it was probably back in 2017 when he was in [place].

## 2023-12-03 NOTE — Telephone Encounter (Signed)
 Additional information has been requested from the patient's insurance in order to proceed with the prior authorization request.   Submitted via CoverMyMeds  Key: [id number]

## 2023-12-06 ENCOUNTER — Encounter: Payer: Self-pay | Admitting: *Deleted

## 2023-12-06 ENCOUNTER — Other Ambulatory Visit (HOSPITAL_COMMUNITY): Payer: Self-pay

## 2023-12-06 NOTE — Telephone Encounter (Signed)
 Noted.

## 2023-12-06 NOTE — Telephone Encounter (Signed)
 Pharmacy Patient Advocate Encounter  Received notification from [facility] that Prior Authorization for Farxiga  10MG  tablets has been APPROVED from 12/03/2023 to 12/02/2024   PA #/Case ID/Reference #:  [id number]

## 2023-12-09 ENCOUNTER — Other Ambulatory Visit (HOSPITAL_COMMUNITY): Payer: Self-pay

## 2023-12-10 ENCOUNTER — Other Ambulatory Visit (HOSPITAL_COMMUNITY): Payer: Self-pay

## 2023-12-16 ENCOUNTER — Other Ambulatory Visit (HOSPITAL_COMMUNITY): Payer: Self-pay

## 2023-12-17 ENCOUNTER — Other Ambulatory Visit (HOSPITAL_BASED_OUTPATIENT_CLINIC_OR_DEPARTMENT_OTHER): Payer: Self-pay

## 2023-12-17 ENCOUNTER — Other Ambulatory Visit (HOSPITAL_COMMUNITY): Payer: Self-pay

## 2023-12-18 ENCOUNTER — Other Ambulatory Visit (HOSPITAL_COMMUNITY): Payer: Self-pay

## 2023-12-20 ENCOUNTER — Other Ambulatory Visit (HOSPITAL_COMMUNITY): Payer: Self-pay

## 2023-12-20 ENCOUNTER — Other Ambulatory Visit: Payer: Self-pay

## 2023-12-22 ENCOUNTER — Other Ambulatory Visit (HOSPITAL_COMMUNITY): Payer: Self-pay

## 2024-01-03 ENCOUNTER — Other Ambulatory Visit (HOSPITAL_COMMUNITY): Payer: Self-pay

## 2024-01-10 ENCOUNTER — Encounter: Payer: Self-pay | Admitting: Family Medicine

## 2024-01-10 ENCOUNTER — Other Ambulatory Visit (HOSPITAL_COMMUNITY): Payer: Self-pay

## 2024-01-10 ENCOUNTER — Ambulatory Visit (INDEPENDENT_AMBULATORY_CARE_PROVIDER_SITE_OTHER): Admitting: Family Medicine

## 2024-01-10 VITALS — BP 122/72 | HR 89 | Temp 97.5°F | Ht 74.0 in | Wt 224.0 lb

## 2024-01-10 DIAGNOSIS — J0101 Acute recurrent maxillary sinusitis: Secondary | ICD-10-CM | POA: Diagnosis not present

## 2024-01-10 DIAGNOSIS — J40 Bronchitis, not specified as acute or chronic: Secondary | ICD-10-CM

## 2024-01-10 MED ORDER — AZITHROMYCIN 250 MG PO TABS
ORAL_TABLET | ORAL | 0 refills | Status: AC
  Filled 2024-01-10: qty 6, 5d supply, fill #0

## 2024-01-10 MED ORDER — CEPHALEXIN 500 MG PO CAPS
500.0000 mg | ORAL_CAPSULE | Freq: Two times a day (BID) | ORAL | 0 refills | Status: DC
  Filled 2024-01-10: qty 14, 7d supply, fill #0

## 2024-01-10 MED ORDER — CEPHALEXIN 500 MG PO CAPS
500.0000 mg | ORAL_CAPSULE | Freq: Two times a day (BID) | ORAL | 0 refills | Status: AC
  Filled 2024-01-10: qty 28, 14d supply, fill #0

## 2024-01-10 NOTE — Progress Notes (Signed)
 Subjective:     Patient ID: Luis Fernandez, male    DOB: Dec 20, 1945, 78 y.o.   MRN: [id number]  Chief Complaint  Patient presents with   Cough    Congestion in chest; hurts to cough "I feel it in my stomach"; sore throat as well "think its sinus infection"; 1-2wks    HPI Discussed the use of AI scribe software for clinical note transcription with the patient, who gave verbal consent to proceed.  History of Present Illness Luis Fernandez "Luis Fernandez" is a 78 year old male who presents with worsening cough, chest soreness, and sinus pressure.  He has been experiencing symptoms for about a week to a week and a half, with a progressively worsening cough, chest soreness, and sinus pressure. The cough is severe, causing chest soreness and a sore throat. He also feels pressure in his chest and sinuses. No fever, as he hasn't taken his temperature but doesn't feel feverish. No shortness of breath, but he notes chest pressure.  His blood sugar levels have been slightly higher than usual over the past week, likely due to his illness. This morning, his blood sugar was 158 mg/dL, higher than his usual range of 120 to 130 mg/dL.  He experiences significant drainage down the back of his throat, which he attributes to his sinus issues. He has been more sedentary than usual, although he has managed to walk his dog, albeit not as far as usual.  He has a history of sinus infections, which began after living in the [place] area. He had walking pneumonia over 20 years ago but does not recall the symptoms clearly. He is an ex-smoker with no history of asthma and has never used an inhaler.    There are no preventive care reminders to display for this patient.  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   NOSE SURGERY     NOSE SURGERY     bone spurs   TOTAL KNEE ARTHROPLASTY     UMBILICAL HERNIA REPAIR       Current Outpatient Medications:    Accu-Chek Softclix Lancets lancets, USE TO TEST BLOOD SUGAR UP TO THREE TIMES A DAY, Disp: 100 each, Rfl: 3   aspirin EC 81 MG tablet, Take 81 mg by mouth daily. Swallow whole., Disp: , Rfl:    azithromycin  (ZITHROMAX ) 250 MG tablet, Take 2 tablets on day 1, then 1 tablet daily on days 2 through 5, Disp: 6 tablet, Rfl: 0   Cyanocobalamin  (VITAMIN B12 PO), Take by mouth., Disp: , Rfl:    dapagliflozin  propanediol (FARXIGA ) 10 MG TABS tablet, Take 1 tablet (10 mg total) by mouth daily., Disp: 90 tablet, Rfl: 3   Dulaglutide  (TRULICITY ) 3 MG/0.5ML SOAJ, Inject 3 mg into the skin once a week., Disp: 6 mL, Rfl: 1  ezetimibe  (ZETIA ) 10 MG tablet, Take 1 tablet (10 mg total) by mouth daily., Disp: 90 tablet, Rfl: 3   fenofibrate  (TRICOR ) 145 MG tablet, Take 1 tablet (145 mg total) by mouth daily., Disp: 90 tablet, Rfl: 3   Glucosamine 500 MG CAPS, Take by mouth., Disp: , Rfl:    glucose blood (ACCU-CHEK GUIDE TEST) test strip, USE TO TEST BLOOD SUGAR UP TO THREE TIMES A DAY, Disp: 100 strip, Rfl: 3   ibuprofen  (ADVIL ) 600 MG tablet, Take 1 tablet (600 mg total) by mouth every 8 (eight) hours as needed., Disp: 270 tablet, Rfl: 3   LORazepam  (ATIVAN ) 0.5 MG tablet, Take 1 tablet (0.5 mg total) by mouth daily., Disp: 30 tablet, Rfl: 5   losartan  (COZAAR ) 50 MG tablet, Take 1 tablet (50 mg total) by mouth daily., Disp: 90 tablet, Rfl: 1   MELATONIN PO, Take by mouth., Disp: , Rfl:     metFORMIN  (GLUCOPHAGE -XR) 500 MG 24 hr tablet, Take 2 tablets (1,000 mg total) by mouth 2 (two) times daily., Disp: 360 tablet, Rfl: 1   montelukast  (SINGULAIR ) 10 MG tablet, Take 1 tablet (10 mg total) by mouth at bedtime., Disp: 90 tablet, Rfl: 2   Multiple Vitamins-Minerals (MULTIVITAMIN PO), Take by mouth., Disp: , Rfl:    Omega-3 Fatty Acids (FISH OIL PO), Take by mouth., Disp: , Rfl:    temazepam  (RESTORIL ) 30 MG capsule, Take 1 capsule (30 mg total) by mouth at bedtime as needed for sleep., Disp: 30 capsule, Rfl: 5   cephALEXin  (KEFLEX ) 500 MG capsule, Take 1 capsule (500 mg total) by mouth 2 (two) times daily for 14 days. Take for 7 days, Disp: 28 capsule, Rfl: 0  Allergies  Allergen Reactions   Prednisone      Intolerance    ROS neg/noncontributory except as noted HPI/below      Objective:      BP 122/72   Pulse 89   Temp (!) 97.5 F (36.4 C)   Ht 6\' 2"  (1.88 m)   Wt 224 lb (101.6 kg)   SpO2 95%   BMI 28.76 kg/m  Wt Readings from Last 3 Encounters:  01/10/24 224 lb (101.6 kg)  10/05/23 222 lb (100.7 kg)  09/02/23 223 lb 9.6 oz (101.4 kg)    Physical Exam   Gen: WDWN NAD HEENT: NCAT, conjunctiva not injected, sclera nonicteric TM WNL B, OP moist, no exudates  NECK:  supple, no thyromegaly, no nodes,  CARDIAC: RRR, S1S2+, no murmur.  LUNGS: CTAB. No wheezes EXT:  no edema MSK: no gross abnormalities.  NEURO: A&O x3.  CN II-XII intact.  PSYCH: normal mood. Good eye contact     Assessment & Plan:  Bronchitis  Acute recurrent maxillary sinusitis  Other orders -     Azithromycin ; Take 2 tablets on day 1, then 1 tablet daily on days 2 through 5  Dispense: 6 tablet; Refill: 0 -     Cephalexin ; Take 1 capsule (500 mg total) by mouth 2 (two) times daily for 14 days. Take for 7 days  Dispense: 28 capsule; Refill: 0  Assessment and Plan Assessment & Plan Acute Sinusitis and Bronchitis   Luis Fernandez has experienced symptoms for 10 days, including worsening cough,  sore throat, chest soreness, and sinus pressure. Initially considered a cold, it has progressed to sinusitis and bronchitis. There is concern for possible pneumonia, but current assessment does not indicate a full-blown sinus infection or pneumonia. He reports significant postnasal drainage and chest pressure, but no fever  or shortness of breath. Oxygen levels are good, and he has no asthma. As an ex-smoker, he has no history of lung issues requiring an inhaler. Significant discomfort indicates a need for antibiotic treatment. Start Z-Pak (azithromycin ) for 5 days and Cefalexin 500 mg, 2 times a day, potentially for 14 days. Instruct him to monitor symptoms and report if there is no improvement or worsening in the next two days. Discontinue Cefalexin after 7 days if symptoms improve significantly. Advise him to keep in touch with updates on his condition.  Type 2 Diabetes Mellitus   Luis Fernandez's blood sugar levels have been slightly elevated over the past week, likely due to the current illness. He reports a fasting blood sugar of 158 mg/dL, higher than his usual range of 120-130 mg/dL. There is no history of extremely high blood sugar levels, even when taking steroids. Prednisone  is contraindicated due to a history of false Addison's disease. Monitor blood sugar levels closely, especially if symptoms worsen or if additional medications are required.    Return if symptoms worsen or fail to improve.  Ellsworth Haas, MD

## 2024-01-10 NOTE — Patient Instructions (Addendum)
 Zpack sent and keflex 

## 2024-01-11 ENCOUNTER — Ambulatory Visit: Admitting: Family Medicine

## 2024-01-12 ENCOUNTER — Encounter: Payer: Self-pay | Admitting: Family Medicine

## 2024-01-17 ENCOUNTER — Other Ambulatory Visit (HOSPITAL_COMMUNITY): Payer: Self-pay

## 2024-01-17 ENCOUNTER — Other Ambulatory Visit: Payer: Self-pay

## 2024-02-02 ENCOUNTER — Other Ambulatory Visit (HOSPITAL_COMMUNITY): Payer: Self-pay

## 2024-02-03 ENCOUNTER — Other Ambulatory Visit (HOSPITAL_COMMUNITY): Payer: Self-pay

## 2024-02-07 ENCOUNTER — Other Ambulatory Visit: Payer: Self-pay

## 2024-02-07 ENCOUNTER — Other Ambulatory Visit: Payer: Self-pay | Admitting: Family Medicine

## 2024-02-07 ENCOUNTER — Other Ambulatory Visit (HOSPITAL_COMMUNITY): Payer: Self-pay

## 2024-02-07 MED ORDER — LOSARTAN POTASSIUM 50 MG PO TABS
50.0000 mg | ORAL_TABLET | Freq: Every day | ORAL | 1 refills | Status: DC
  Filled 2024-02-07: qty 90, 90d supply, fill #0
  Filled 2024-05-12: qty 90, 90d supply, fill #1

## 2024-02-17 ENCOUNTER — Other Ambulatory Visit: Payer: Self-pay

## 2024-02-28 ENCOUNTER — Other Ambulatory Visit (HOSPITAL_COMMUNITY): Payer: Self-pay

## 2024-02-28 ENCOUNTER — Other Ambulatory Visit (HOSPITAL_BASED_OUTPATIENT_CLINIC_OR_DEPARTMENT_OTHER): Payer: Self-pay

## 2024-03-04 ENCOUNTER — Other Ambulatory Visit (HOSPITAL_COMMUNITY): Payer: Self-pay

## 2024-03-09 ENCOUNTER — Other Ambulatory Visit: Payer: Self-pay | Admitting: Family Medicine

## 2024-03-09 DIAGNOSIS — E1142 Type 2 diabetes mellitus with diabetic polyneuropathy: Secondary | ICD-10-CM

## 2024-03-14 ENCOUNTER — Other Ambulatory Visit (HOSPITAL_COMMUNITY): Payer: Self-pay

## 2024-03-17 ENCOUNTER — Other Ambulatory Visit (HOSPITAL_COMMUNITY): Payer: Self-pay

## 2024-03-19 ENCOUNTER — Encounter: Payer: Self-pay | Admitting: Family Medicine

## 2024-04-04 ENCOUNTER — Other Ambulatory Visit: Payer: Self-pay | Admitting: Family Medicine

## 2024-04-04 ENCOUNTER — Other Ambulatory Visit (HOSPITAL_COMMUNITY): Payer: Self-pay

## 2024-04-04 MED ORDER — IBUPROFEN 600 MG PO TABS
600.0000 mg | ORAL_TABLET | Freq: Three times a day (TID) | ORAL | 1 refills | Status: AC | PRN
  Filled 2024-04-04: qty 270, 90d supply, fill #0
  Filled 2024-06-22: qty 270, 90d supply, fill #1

## 2024-04-06 ENCOUNTER — Ambulatory Visit: Admitting: Family Medicine

## 2024-04-09 ENCOUNTER — Other Ambulatory Visit (HOSPITAL_COMMUNITY): Payer: Self-pay

## 2024-04-09 ENCOUNTER — Other Ambulatory Visit: Payer: Self-pay | Admitting: Family Medicine

## 2024-04-09 DIAGNOSIS — E1142 Type 2 diabetes mellitus with diabetic polyneuropathy: Secondary | ICD-10-CM

## 2024-04-09 MED ORDER — TRULICITY 3 MG/0.5ML ~~LOC~~ SOAJ
3.0000 mg | SUBCUTANEOUS | 1 refills | Status: AC
  Filled 2024-04-09: qty 6, 84d supply, fill #0
  Filled 2024-04-10: qty 2, 28d supply, fill #0
  Filled 2024-05-03: qty 2, 28d supply, fill #1
  Filled 2024-06-01: qty 2, 28d supply, fill #2
  Filled 2024-06-29: qty 2, 28d supply, fill #3
  Filled 2024-08-04: qty 2, 28d supply, fill #4
  Filled 2024-08-27: qty 2, 28d supply, fill #5

## 2024-04-10 ENCOUNTER — Other Ambulatory Visit: Payer: Self-pay | Admitting: Family Medicine

## 2024-04-10 ENCOUNTER — Telehealth: Payer: Self-pay

## 2024-04-10 ENCOUNTER — Other Ambulatory Visit (HOSPITAL_COMMUNITY): Payer: Self-pay

## 2024-04-10 ENCOUNTER — Telehealth (HOSPITAL_COMMUNITY): Payer: Self-pay

## 2024-04-10 DIAGNOSIS — F5101 Primary insomnia: Secondary | ICD-10-CM

## 2024-04-10 MED ORDER — TEMAZEPAM 30 MG PO CAPS
30.0000 mg | ORAL_CAPSULE | Freq: Every evening | ORAL | 0 refills | Status: DC | PRN
  Filled 2024-04-10 (×3): qty 30, 30d supply, fill #0
  Filled 2024-04-14: qty 15, 15d supply, fill #1
  Filled 2024-04-14: qty 15, 15d supply, fill #0
  Filled ????-??-??: fill #0

## 2024-04-10 NOTE — Telephone Encounter (Signed)
 PA request has been Received. New Encounter has been or will be created for follow up. For additional info see Pharmacy Prior Auth telephone encounter from 04/10/24.

## 2024-04-10 NOTE — Telephone Encounter (Signed)
 Pharmacy Patient Advocate Encounter   Received notification from Pt Calls Messages that prior authorization for Temazepam  30MG  capsules  is required/requested.   Insurance verification completed.   The patient is insured through [facility] .   Per test claim: PA required; PA submitted to above mentioned insurance via Latent Key/confirmation #/EOC [id number] Status is pending

## 2024-04-10 NOTE — Telephone Encounter (Signed)
 Needs to get appt resch

## 2024-04-11 ENCOUNTER — Ambulatory Visit: Admitting: Family Medicine

## 2024-04-11 ENCOUNTER — Encounter: Payer: Self-pay | Admitting: Family Medicine

## 2024-04-11 ENCOUNTER — Other Ambulatory Visit: Payer: Self-pay

## 2024-04-11 ENCOUNTER — Other Ambulatory Visit (HOSPITAL_COMMUNITY): Payer: Self-pay

## 2024-04-11 VITALS — BP 150/80 | HR 61 | Ht 74.0 in | Wt 223.0 lb

## 2024-04-11 DIAGNOSIS — M79644 Pain in right finger(s): Secondary | ICD-10-CM | POA: Diagnosis not present

## 2024-04-11 DIAGNOSIS — M65341 Trigger finger, right ring finger: Secondary | ICD-10-CM

## 2024-04-11 NOTE — Progress Notes (Signed)
   I, Luis Fernandez, CMA acting as a scribe for Artist Lloyd, MD.  Luis Fernandez is a 78 y.o. male who presents to [facility] today for exacerbation of his R hand pain. Pt was last seen by Dr. Lloyd on 08/09/23 and was given a R 3rd and 4th trigger finger injections.   Today, pt reports xacerbation of right hand sx over the past week. Pt locates pain to the palm and 4th digit. Has tried saoking and stretching. Sx responded well to injection previously.   Pertinent review of systems: No fevers or chills  Relevant historical information: Hypertension and diabetes.  His wife is currently in the hospital.  He is under a bit of stress.   Exam:  BP (!) 150/80   Pulse 61   Ht 6' 2 (1.88 m)   Wt 223 lb (101.2 kg)   SpO2 98%   BMI 28.63 kg/m  General: Well Developed, well nourished, and in no acute distress.   MSK: Right hand tender palpation palmar fourth MCP.  Triggering present with flexion of this joint.    Lab and Radiology Results  Injections:  Procedure: Real-time Ultrasound Guided Injection of right fourth MCP tendon sheath at A1 pulley.  Trigger finger injection Device: Philips Affiniti 50G/GE Logiq Images permanently stored and available for review in PACS Verbal informed consent obtained.  Discussed risks and benefits of procedure. Warned about infection, bleeding, hyperglycemia damage to structures among others. Patient expresses understanding and agreement Time-out conducted.   Noted no overlying erythema, induration, or other signs of local infection.   Skin prepped in a sterile fashion.   Local anesthesia: Topical Ethyl chloride.   With sterile technique and under real time ultrasound guidance: 40 mg of Kenalog  and 1 mL of lidocaine injected into A1 pulley tendon sheath. Fluid seen entering the tendon sheath.   Completed without difficulty   Pain immediately resolved suggesting accurate placement of the medication.   Advised to  call if fevers/chills, erythema, induration, drainage, or persistent bleeding.   Images permanently stored and available for review in the ultrasound unit.  Impression: Technically successful ultrasound guided injection.         Assessment and Plan: 78 y.o. male with right fourth digit trigger finger.  Chronic problem with recurrence.  Last injection was in January of this year.  Plan for repeat injection today continue double Band-Aid splint.  Check back as needed.   PDMP not reviewed this encounter. Orders Placed This Encounter  Procedures   US  LIMITED JOINT SPACE STRUCTURES UP RIGHT(NO LINKED CHARGES)    Reason for Exam (SYMPTOM  OR DIAGNOSIS REQUIRED):   right hand pain    Preferred imaging location?:   [facility]   No orders of the defined types were placed in this encounter.    Discussed warning signs or symptoms. Please see discharge instructions. Patient expresses understanding.   The above documentation has been reviewed and is accurate and complete Artist Lloyd, M.D.

## 2024-04-11 NOTE — Telephone Encounter (Signed)
 FYI

## 2024-04-11 NOTE — Patient Instructions (Signed)
 Thank you for coming in today.   You received an injection today. Seek immediate medical attention if the joint becomes red, extremely painful, or is oozing fluid.

## 2024-04-11 NOTE — Telephone Encounter (Signed)
 Pharmacy Patient Advocate Encounter  Received notification from [facility] that Prior Authorization for Temazepam  30MG  capsules  has been DENIED.  See denial reason below. No denial letter attached in CMM. Will attach denial letter to Media tab once received.   PA #/Case ID/Reference #: [id number]   *insurance will only allow 15 caps/30 days, based on pharmacy notes patient will do 15 on insurance and pay out of pocket for the other 15.

## 2024-04-13 ENCOUNTER — Other Ambulatory Visit: Payer: Self-pay | Admitting: Family Medicine

## 2024-04-14 ENCOUNTER — Other Ambulatory Visit: Payer: Self-pay

## 2024-04-14 ENCOUNTER — Other Ambulatory Visit (HOSPITAL_COMMUNITY): Payer: Self-pay

## 2024-04-14 MED ORDER — LORAZEPAM 0.5 MG PO TABS
0.5000 mg | ORAL_TABLET | Freq: Every day | ORAL | 0 refills | Status: DC
  Filled 2024-04-14: qty 30, 30d supply, fill #0

## 2024-04-16 ENCOUNTER — Other Ambulatory Visit: Payer: Self-pay | Admitting: Family Medicine

## 2024-04-16 DIAGNOSIS — E1142 Type 2 diabetes mellitus with diabetic polyneuropathy: Secondary | ICD-10-CM

## 2024-04-24 ENCOUNTER — Other Ambulatory Visit (HOSPITAL_COMMUNITY): Payer: Self-pay

## 2024-04-24 ENCOUNTER — Other Ambulatory Visit: Payer: Self-pay | Admitting: Family Medicine

## 2024-04-24 DIAGNOSIS — E1142 Type 2 diabetes mellitus with diabetic polyneuropathy: Secondary | ICD-10-CM

## 2024-04-24 MED ORDER — DAPAGLIFLOZIN PROPANEDIOL 10 MG PO TABS
10.0000 mg | ORAL_TABLET | Freq: Every day | ORAL | 3 refills | Status: AC
  Filled 2024-04-24: qty 90, 90d supply, fill #0
  Filled 2024-07-20: qty 90, 90d supply, fill #1

## 2024-04-25 ENCOUNTER — Encounter: Payer: Self-pay | Admitting: Family Medicine

## 2024-04-25 ENCOUNTER — Ambulatory Visit (INDEPENDENT_AMBULATORY_CARE_PROVIDER_SITE_OTHER): Admitting: Family Medicine

## 2024-04-25 ENCOUNTER — Other Ambulatory Visit (HOSPITAL_COMMUNITY): Payer: Self-pay

## 2024-04-25 VITALS — BP 137/77 | HR 82 | Temp 98.1°F | Resp 18 | Ht 74.0 in | Wt 224.1 lb

## 2024-04-25 DIAGNOSIS — E785 Hyperlipidemia, unspecified: Secondary | ICD-10-CM

## 2024-04-25 DIAGNOSIS — F5101 Primary insomnia: Secondary | ICD-10-CM | POA: Diagnosis not present

## 2024-04-25 DIAGNOSIS — N401 Enlarged prostate with lower urinary tract symptoms: Secondary | ICD-10-CM

## 2024-04-25 DIAGNOSIS — I152 Hypertension secondary to endocrine disorders: Secondary | ICD-10-CM

## 2024-04-25 DIAGNOSIS — Z7984 Long term (current) use of oral hypoglycemic drugs: Secondary | ICD-10-CM | POA: Diagnosis not present

## 2024-04-25 DIAGNOSIS — E1159 Type 2 diabetes mellitus with other circulatory complications: Secondary | ICD-10-CM

## 2024-04-25 DIAGNOSIS — G720 Drug-induced myopathy: Secondary | ICD-10-CM | POA: Diagnosis not present

## 2024-04-25 DIAGNOSIS — Z7985 Long-term (current) use of injectable non-insulin antidiabetic drugs: Secondary | ICD-10-CM

## 2024-04-25 DIAGNOSIS — R350 Frequency of micturition: Secondary | ICD-10-CM | POA: Diagnosis not present

## 2024-04-25 DIAGNOSIS — E1142 Type 2 diabetes mellitus with diabetic polyneuropathy: Secondary | ICD-10-CM

## 2024-04-25 DIAGNOSIS — G4733 Obstructive sleep apnea (adult) (pediatric): Secondary | ICD-10-CM

## 2024-04-25 DIAGNOSIS — E538 Deficiency of other specified B group vitamins: Secondary | ICD-10-CM | POA: Diagnosis not present

## 2024-04-25 DIAGNOSIS — T466X5A Adverse effect of antihyperlipidemic and antiarteriosclerotic drugs, initial encounter: Secondary | ICD-10-CM | POA: Diagnosis not present

## 2024-04-25 MED ORDER — EZETIMIBE 10 MG PO TABS
10.0000 mg | ORAL_TABLET | Freq: Every day | ORAL | 3 refills | Status: AC
Start: 2024-04-25 — End: ?
  Filled 2024-04-25 – 2024-06-15 (×2): qty 90, 90d supply, fill #0

## 2024-04-25 MED ORDER — MONTELUKAST SODIUM 10 MG PO TABS
10.0000 mg | ORAL_TABLET | Freq: Every day | ORAL | 2 refills | Status: AC
  Filled 2024-04-25 – 2024-05-03 (×2): qty 90, 90d supply, fill #0
  Filled 2024-08-14: qty 90, 90d supply, fill #1

## 2024-04-25 MED ORDER — METFORMIN HCL ER 500 MG PO TB24
1000.0000 mg | ORAL_TABLET | Freq: Two times a day (BID) | ORAL | 1 refills | Status: AC
  Filled 2024-04-25 – 2024-05-12 (×2): qty 360, 90d supply, fill #0
  Filled 2024-08-14: qty 360, 90d supply, fill #1

## 2024-04-25 MED ORDER — TEMAZEPAM 30 MG PO CAPS
30.0000 mg | ORAL_CAPSULE | Freq: Every evening | ORAL | 0 refills | Status: DC | PRN
  Filled 2024-04-25 – 2024-05-12 (×4): qty 30, 30d supply, fill #0

## 2024-04-25 MED ORDER — TADALAFIL 5 MG PO TABS
5.0000 mg | ORAL_TABLET | Freq: Every day | ORAL | 3 refills | Status: AC
  Filled 2024-04-25: qty 90, 90d supply, fill #0
  Filled 2024-07-20: qty 90, 90d supply, fill #1

## 2024-04-25 MED ORDER — TADALAFIL 5 MG PO TABS
5.0000 mg | ORAL_TABLET | Freq: Every day | ORAL | 11 refills | Status: DC
  Filled 2024-04-25: qty 30, 30d supply, fill #0

## 2024-04-25 MED ORDER — LORAZEPAM 0.5 MG PO TABS
0.5000 mg | ORAL_TABLET | Freq: Every day | ORAL | 3 refills | Status: DC
  Filled 2024-04-25 – 2024-05-12 (×2): qty 30, 30d supply, fill #0
  Filled 2024-06-09: qty 30, 30d supply, fill #1
  Filled 2024-07-08: qty 30, 30d supply, fill #2
  Filled 2024-08-05 (×2): qty 30, 30d supply, fill #3

## 2024-04-25 NOTE — Progress Notes (Signed)
 Subjective:     Patient ID: Luis Fernandez, male    DOB: 1946-06-23, 78 y.o.   MRN: [id number]  Chief Complaint  Patient presents with   Medical Management of Chronic Issues    6 month follow-up Enlarged prostate getting worse    HPI Discussed the use of AI scribe software for clinical note transcription with the patient, who gave verbal consent to proceed.  History of Present Illness Luis Fernandez is a 78 year old male with hypertension, diabetes, and prostate issues who presents for a six-month follow-up.  Over the past six months, he has experienced worsening symptoms related to his prostate, including frequent urination during the day, urgency, and a sensation of incomplete bladder emptying. There is no nocturia or decrease in urinary stream. He has not been on medication for these symptoms previously. Would like to try daily cialis  is ED as well(has used 20mg  in past for ED)  He has hypertension, managed with losartan  50 mg daily, and his blood pressure readings are generally below 120/70 mmHg. No headaches, dizziness, chest pain, shortness of breath, or swelling in his legs.  He has diabetes, managed with Trulicity  3 mg weekly and metformin  1000 mg twice daily. He monitors his blood sugar levels, which are generally in the low 100s but have been erratic recently, possibly due to stress from his wife's hospitalization.  He experiences insomnia and takes temazepam  three to five times a week. He also uses lorazepam  occasionally for muscle relaxation, particularly for cramps in his left leg. No SI.  Aware has to pay out of pocked but not wanting to try other meds.  He has a history of sleep apnea and uses a CPAP machine nightly, which he finds effective.  He takes Zetia  10 mg and fenofibrate  145 mg for cholesterol management and reports no issues with these medications. Myopathy to statins  He has a history of allergies and takes Singulair  10 mg daily along with a  generic nasal spray twice a day, which manages his symptoms well.  He takes ibuprofen  daily for arthritis pain, particularly related to his knee, which has undergone three surgeries.  He takes several over-the-counter supplements including aspirin, B12, glucosamine, fish oil, and vitamins.  No new surgeries. He has a family history of heart disease and Parkinson's disease.    Health Maintenance Due  Topic Date Due   HEMOGLOBIN A1C  04/06/2024   Medicare Annual Wellness (AWV)  06/06/2024    Past Medical History:  Diagnosis Date   Allergy    Arthritis    Cataract    Surgical correction in both eyes   Diabetes mellitus without complication (HCC) 25+ years ago   Hyperlipidemia LDL goal <70 12/17/2015   Hypertension 15 to 20 years ago   Under control with medication   Sleep apnea    use cpap machine nightly   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   NOSE SURGERY     NOSE SURGERY     bone spurs   TOTAL KNEE ARTHROPLASTY     UMBILICAL HERNIA REPAIR       Current Outpatient Medications:  ACCU-CHEK GUIDE TEST test strip, USE TO TEST BLOOD SUGAR UP TO THREE TIMES A DAY, Disp: 100 strip, Rfl: 3   Accu-Chek Softclix Lancets lancets, USE 1 LANCET TO TEST 3 TIMES A DAY, Disp: 100 each, Rfl: 3   aspirin EC 81 MG tablet, Take 81 mg by mouth daily. Swallow whole., Disp: , Rfl:    Cyanocobalamin  (VITAMIN B12 PO), Take by mouth., Disp: , Rfl:    dapagliflozin  propanediol (FARXIGA ) 10 MG TABS tablet, Take 1 tablet (10 mg total) by mouth daily., Disp: 90 tablet, Rfl: 3   Dulaglutide  (TRULICITY ) 3 MG/0.5ML SOAJ, Inject 3 mg into the skin once a week., Disp: 6 mL, Rfl: 1    fenofibrate  (TRICOR ) 145 MG tablet, Take 1 tablet (145 mg total) by mouth daily., Disp: 90 tablet, Rfl: 3   Glucosamine 500 MG CAPS, Take by mouth., Disp: , Rfl:    ibuprofen  (ADVIL ) 600 MG tablet, Take 1 tablet (600 mg total) by mouth every 8 (eight) hours as needed., Disp: 270 tablet, Rfl: 1   losartan  (COZAAR ) 50 MG tablet, Take 1 tablet (50 mg total) by mouth daily., Disp: 90 tablet, Rfl: 1   MELATONIN PO, Take by mouth., Disp: , Rfl:    Multiple Vitamins-Minerals (MULTIVITAMIN PO), Take by mouth., Disp: , Rfl:    Omega-3 Fatty Acids (FISH OIL PO), Take by mouth., Disp: , Rfl:    ezetimibe  (ZETIA ) 10 MG tablet, Take 1 tablet (10 mg total) by mouth daily., Disp: 90 tablet, Rfl: 3   LORazepam  (ATIVAN ) 0.5 MG tablet, Take 1 tablet (0.5 mg total) by mouth daily., Disp: 30 tablet, Rfl: 3   metFORMIN  (GLUCOPHAGE -XR) 500 MG 24 hr tablet, Take 2 tablets (1,000 mg total) by mouth 2 (two) times daily., Disp: 360 tablet, Rfl: 1   montelukast  (SINGULAIR ) 10 MG tablet, Take 1 tablet (10 mg total) by mouth at bedtime., Disp: 90 tablet, Rfl: 2   tadalafil  (CIALIS ) 5 MG tablet, Take 1 tablet (5 mg total) by mouth daily., Disp: 90 tablet, Rfl: 3   temazepam  (RESTORIL ) 30 MG capsule, Take 1 capsule (30 mg total) by mouth at bedtime as needed for sleep., Disp: 30 capsule, Rfl: 0  Allergies  Allergen Reactions   Prednisone      Intolerance    ROS neg/noncontributory except as noted HPI/below      Objective:     BP 137/77   Pulse 82   Temp 98.1 F (36.7 C) (Temporal)   Resp 18   Ht 6' 2 (1.88 m)   Wt 224 lb 2 oz (101.7 kg)   SpO2 96%   BMI 28.78 kg/m  Wt Readings from Last 3 Encounters:  04/25/24 224 lb 2 oz (101.7 kg)  04/11/24 223 lb (101.2 kg)  01/10/24 224 lb (101.6 kg)    Physical Exam   Gen: WDWN NAD HEENT: NCAT, conjunctiva not injected, sclera nonicteric NECK:  supple, no thyromegaly, no nodes, no carotid bruits CARDIAC: RRR, S1S2+, no murmur. DP 2+B LUNGS: CTAB. No  wheezes ABDOMEN:  BS+, soft, NTND, No HSM, no masses EXT:  no edema.  Vericose veins MSK: no gross abnormalities.  NEURO: A&O x3.  CN II-XII intact.  PSYCH: normal mood. Good eye contact     Assessment & Plan:  Type 2 diabetes mellitus with diabetic polyneuropathy, without long-term current use of insulin (HCC) -     Comprehensive metabolic panel with GFR -     Hemoglobin A1c -     metFORMIN  HCl ER; Take 2  tablets (1,000 mg total) by mouth 2 (two) times daily.  Dispense: 360 tablet; Refill: 1 -     CBC with Differential/Platelet  Hypertension associated with diabetes (HCC)  Hyperlipidemia LDL goal <70 -     Ezetimibe ; Take 1 tablet (10 mg total) by mouth daily.  Dispense: 90 tablet; Refill: 3  Primary insomnia -     Temazepam ; Take 1 capsule (30 mg total) by mouth at bedtime as needed for sleep.  Dispense: 30 capsule; Refill: 0  OSA (obstructive sleep apnea)  Statin myopathy  Benign prostatic hyperplasia with urinary frequency -     Tadalafil ; Take 1 tablet (5 mg total) by mouth daily.  Dispense: 90 tablet; Refill: 3  B12 deficiency  Long term current use of oral hypoglycemic drug  Long-term current use of injectable noninsulin antidiabetic medication  Other orders -     LORazepam ; Take 1 tablet (0.5 mg total) by mouth daily.  Dispense: 30 tablet; Refill: 3 -     Montelukast  Sodium; Take 1 tablet (10 mg total) by mouth at bedtime.  Dispense: 90 tablet; Refill: 2  Assessment and Plan Assessment & Plan Benign prostatic hyperplasia with lower urinary tract symptoms and erectile dysfunction   Symptoms have worsened over the past six months, with frequent urination, urgency, and incomplete bladder emptying, but no nocturia or decreased urinary stream. He reports erectile dysfunction. Start daily Cialis  5mg (tadalafil ) for BPH and erectile dysfunction. Discussed potential side effects, including dizziness, lightheadedness, and slight blood pressure drop. Advised against taking  nitrates while on Cialis .  Type 2 diabetes mellitus   w/neuropathy-chronic. controlled Blood glucose levels are generally in the low 100s but have been erratic recently due to stress from his wife's hospitalization. Continue current diabetes medications: Farxiga , Trulicity , and metformin .  Essential hypertension   Blood pressure readings are generally below 120/70 mmHg. Continue losartan  50 mg daily.  Hyperlipidemia   He takes Zetia  and fenofibrate  without issues, having experienced myopathy from statins in the past. Refill Zetia  prescription and continue fenofibrate .  Obstructive sleep apnea   He uses CPAP every night and finds it effective.  Allergic rhinitis   Symptoms are managed effectively with Singulair  and a generic nasal spray. Refill montelukast  (Singulair ).  Chronic sinusitis   He experiences frequent sinus headaches attributed to chronic sinusitis.  Insomnia   He takes temazepam  3-4 times a week for insomnia, attributed to worrying about his wife. Continue temazepam  as needed. PDMP checked.  Pt knows cash pay since insurance not cover and not wanting to try other meds as this works and aware of risk d/t age  Generalized anxiety disorder   He uses lorazepam  more for muscle relaxation than anxiety and reports it helps with nocturnal leg cramps. Refill lorazepam  prescription.  Unilateral primary osteoarthritis, left knee (post knee replacement)   He experiences occasional cramps in the left leg, possibly due to nerve damage from multiple knee surgeries. Continue ibuprofen  as needed for pain management.  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  General Health Maintenance   He maintains an active lifestyle and takes various supplements. Continue taking B12, glucosamine, fish oil, and vitamins daily.    Return in about 6 months (around 10/23/2024) for chronic follow-up.  Jenkins CHRISTELLA Carrel, MD

## 2024-04-25 NOTE — Patient Instructions (Signed)

## 2024-04-26 ENCOUNTER — Ambulatory Visit: Payer: Self-pay | Admitting: Family Medicine

## 2024-04-26 LAB — COMPREHENSIVE METABOLIC PANEL WITH GFR
ALT: 38 U/L (ref 0–53)
AST: 31 U/L (ref 0–37)
Albumin: 4.6 g/dL (ref 3.5–5.2)
Alkaline Phosphatase: 47 U/L (ref 39–117)
BUN: 22 mg/dL (ref 6–23)
CO2: 28 meq/L (ref 19–32)
Calcium: 10.2 mg/dL (ref 8.4–10.5)
Chloride: 105 meq/L (ref 96–112)
Creatinine, Ser: 0.9 mg/dL (ref 0.40–1.50)
GFR: 81.36 mL/min (ref >60.00)
Glucose, Bld: 170 mg/dL — ABNORMAL HIGH (ref 70–99)
Potassium: 4.6 meq/L (ref 3.5–5.1)
Sodium: 142 meq/L (ref 135–145)
Total Bilirubin: 0.6 mg/dL (ref 0.2–1.2)
Total Protein: 7 g/dL (ref 6.0–8.3)

## 2024-04-26 LAB — HEMOGLOBIN A1C: Hgb A1c MFr Bld: 7.3 % — ABNORMAL HIGH (ref 4.6–6.5)

## 2024-04-26 LAB — CBC WITH DIFFERENTIAL/PLATELET
Basophils Absolute: 0 K/uL (ref 0.0–0.1)
Basophils Relative: 0.6 % (ref 0.0–3.0)
Eosinophils Absolute: 0.1 K/uL (ref 0.0–0.7)
Eosinophils Relative: 2.1 % (ref 0.0–5.0)
HCT: 48.5 % (ref 39.0–52.0)
Hemoglobin: 16.5 g/dL (ref 13.0–17.0)
Lymphocytes Relative: 28.2 % (ref 12.0–46.0)
Lymphs Abs: 1.6 K/uL (ref 0.7–4.0)
MCHC: 33.9 g/dL (ref 30.0–36.0)
MCV: 96.8 fl (ref 78.0–100.0)
Monocytes Absolute: 0.4 K/uL (ref 0.1–1.0)
Monocytes Relative: 7.5 % (ref 3.0–12.0)
Neutro Abs: 3.4 K/uL (ref 1.4–7.7)
Neutrophils Relative %: 61.6 % (ref 43.0–77.0)
Platelets: 279 K/uL (ref 150.0–400.0)
RBC: 5.01 Mil/uL (ref 4.22–5.81)
RDW: 13.1 % (ref 11.5–15.5)
WBC: 5.6 K/uL (ref 4.0–10.5)

## 2024-04-26 NOTE — Progress Notes (Signed)
 A1C at goal for age, but has increased.  Get back on track with diet

## 2024-04-27 ENCOUNTER — Other Ambulatory Visit (HOSPITAL_COMMUNITY): Payer: Self-pay

## 2024-05-03 ENCOUNTER — Other Ambulatory Visit (HOSPITAL_COMMUNITY): Payer: Self-pay

## 2024-05-03 ENCOUNTER — Other Ambulatory Visit: Payer: Self-pay

## 2024-05-04 ENCOUNTER — Other Ambulatory Visit (HOSPITAL_COMMUNITY): Payer: Self-pay

## 2024-05-08 ENCOUNTER — Other Ambulatory Visit (HOSPITAL_COMMUNITY): Payer: Self-pay

## 2024-05-09 ENCOUNTER — Other Ambulatory Visit (HOSPITAL_COMMUNITY): Payer: Self-pay

## 2024-05-09 DIAGNOSIS — Z23 Encounter for immunization: Secondary | ICD-10-CM | POA: Diagnosis not present

## 2024-05-09 MED ORDER — FLUZONE HIGH-DOSE 0.5 ML IM SUSY
0.5000 mL | PREFILLED_SYRINGE | Freq: Once | INTRAMUSCULAR | 0 refills | Status: AC
  Filled 2024-05-09: qty 0.5, 1d supply, fill #0

## 2024-05-09 MED ORDER — COMIRNATY 30 MCG/0.3ML IM SUSY
0.3000 mL | PREFILLED_SYRINGE | Freq: Once | INTRAMUSCULAR | 0 refills | Status: AC
  Filled 2024-05-09: qty 0.3, 1d supply, fill #0

## 2024-05-12 ENCOUNTER — Other Ambulatory Visit: Payer: Self-pay

## 2024-05-12 ENCOUNTER — Other Ambulatory Visit (HOSPITAL_COMMUNITY): Payer: Self-pay

## 2024-05-31 ENCOUNTER — Ambulatory Visit: Admitting: Family Medicine

## 2024-06-05 ENCOUNTER — Other Ambulatory Visit (HOSPITAL_COMMUNITY): Payer: Self-pay

## 2024-06-06 ENCOUNTER — Ambulatory Visit: Payer: Medicare Other

## 2024-06-06 ENCOUNTER — Other Ambulatory Visit (HOSPITAL_COMMUNITY): Payer: Self-pay

## 2024-06-07 DIAGNOSIS — H524 Presbyopia: Secondary | ICD-10-CM | POA: Diagnosis not present

## 2024-06-07 DIAGNOSIS — H1789 Other corneal scars and opacities: Secondary | ICD-10-CM | POA: Diagnosis not present

## 2024-06-07 DIAGNOSIS — E119 Type 2 diabetes mellitus without complications: Secondary | ICD-10-CM | POA: Diagnosis not present

## 2024-06-07 DIAGNOSIS — H52223 Regular astigmatism, bilateral: Secondary | ICD-10-CM | POA: Diagnosis not present

## 2024-06-07 DIAGNOSIS — H53143 Visual discomfort, bilateral: Secondary | ICD-10-CM | POA: Diagnosis not present

## 2024-06-07 DIAGNOSIS — H5212 Myopia, left eye: Secondary | ICD-10-CM | POA: Diagnosis not present

## 2024-06-07 DIAGNOSIS — H5201 Hypermetropia, right eye: Secondary | ICD-10-CM | POA: Diagnosis not present

## 2024-06-07 DIAGNOSIS — Z9849 Cataract extraction status, unspecified eye: Secondary | ICD-10-CM | POA: Diagnosis not present

## 2024-06-07 DIAGNOSIS — Z961 Presence of intraocular lens: Secondary | ICD-10-CM | POA: Diagnosis not present

## 2024-06-07 LAB — OPHTHALMOLOGY REPORT-SCANNED

## 2024-06-09 ENCOUNTER — Other Ambulatory Visit (HOSPITAL_COMMUNITY): Payer: Self-pay

## 2024-06-09 ENCOUNTER — Other Ambulatory Visit: Payer: Self-pay | Admitting: Family Medicine

## 2024-06-09 DIAGNOSIS — F5101 Primary insomnia: Secondary | ICD-10-CM

## 2024-06-09 MED ORDER — TEMAZEPAM 30 MG PO CAPS
30.0000 mg | ORAL_CAPSULE | Freq: Every evening | ORAL | 4 refills | Status: AC | PRN
  Filled 2024-06-09 (×2): qty 30, 30d supply, fill #0
  Filled 2024-07-07: qty 15, 15d supply, fill #1
  Filled 2024-07-07: qty 15, 15d supply, fill #2
  Filled 2024-07-07: qty 15, 15d supply, fill #1
  Filled 2024-08-05: qty 15, 15d supply, fill #4
  Filled 2024-08-05 (×2): qty 15, 15d supply, fill #3
  Filled 2024-09-04: qty 15, 15d supply, fill #5
  Filled 2024-09-04: qty 15, 15d supply, fill #6

## 2024-06-15 ENCOUNTER — Other Ambulatory Visit (HOSPITAL_COMMUNITY): Payer: Self-pay

## 2024-06-24 ENCOUNTER — Encounter: Payer: Self-pay | Admitting: Family Medicine

## 2024-06-30 ENCOUNTER — Other Ambulatory Visit (HOSPITAL_COMMUNITY): Payer: Self-pay

## 2024-07-03 ENCOUNTER — Other Ambulatory Visit (HOSPITAL_COMMUNITY): Payer: Self-pay

## 2024-07-07 ENCOUNTER — Other Ambulatory Visit (HOSPITAL_COMMUNITY): Payer: Self-pay

## 2024-07-10 ENCOUNTER — Other Ambulatory Visit: Payer: Self-pay

## 2024-07-10 ENCOUNTER — Ambulatory Visit

## 2024-07-10 VITALS — BP 130/68 | HR 97 | Temp 97.9°F | Ht 74.5 in | Wt 226.4 lb

## 2024-07-10 DIAGNOSIS — Z0000 Encounter for general adult medical examination without abnormal findings: Secondary | ICD-10-CM

## 2024-07-10 NOTE — Patient Instructions (Signed)
 Luis Fernandez,  Thank you for taking the time for your Medicare Wellness Visit. I appreciate your continued commitment to your health goals. Please review the care plan we discussed, and feel free to reach out if I can assist you further.  Please note that Annual Wellness Visits do not include a physical exam. Some assessments may be limited, especially if the visit was conducted virtually. If needed, we may recommend an in-person follow-up with your provider.  Ongoing Care Seeing your primary care provider every 3 to 6 months helps us  monitor your health and provide consistent, personalized care.   Referrals If a referral was made during today's visit and you haven't received any updates within two weeks, please contact the referred provider directly to check on the status.  Recommended Screenings:  Health Maintenance  Topic Date Due   Yearly kidney health urinalysis for diabetes  10/04/2024   Complete foot exam   10/04/2024   Hemoglobin A1C  10/23/2024   COVID-19 Vaccine (12 - 2025-26 season) 11/07/2024   Yearly kidney function blood test for diabetes  04/25/2025   Eye exam for diabetics  06/07/2025   Medicare Annual Wellness Visit  07/10/2025   DTaP/Tdap/Td vaccine (3 - Td or Tdap) 05/13/2029   Pneumococcal Vaccine for age over 41  Completed   Flu Shot  Completed   Hepatitis C Screening  Completed   Zoster (Shingles) Vaccine  Completed   Meningitis B Vaccine  Aged Out   Cologuard (Stool DNA test)  Discontinued       06/07/2023   11:25 AM  Advanced Directives  Does Patient Have a Medical Advance Directive? Yes  Type of Estate Agent of [place];Living will  Does patient want to make changes to medical advance directive? No - Patient declined  Copy of Healthcare Power of Attorney in Chart? Yes - validated most recent copy scanned in chart (See row information)    Vision: Annual vision screenings are recommended for early detection of glaucoma, cataracts,  and diabetic retinopathy. These exams can also reveal signs of chronic conditions such as diabetes and high blood pressure.  Dental: Annual dental screenings help detect early signs of oral cancer, gum disease, and other conditions linked to overall health, including heart disease and diabetes.  Please see the attached documents for additional preventive care recommendations.

## 2024-07-10 NOTE — Progress Notes (Signed)
 Chief Complaint  Patient presents with   Medicare Wellness     Subjective:   Luis Fernandez is a 78 y.o. male who presents for a Medicare Annual Wellness Visit.  Visit info / Clinical Intake: Medicare Wellness Visit Type:: Subsequent Annual Wellness Visit Persons participating in visit and providing information:: patient Medicare Wellness Visit Mode:: In-person (required for WTM) Interpreter Needed?: No Pre-visit prep was completed: yes AWV questionnaire completed by patient prior to visit?: yes Date:: 07/09/24 Living arrangements:: (Patient-Rptd) lives with spouse/significant other Patient's Overall Health Status Rating: (!) (Patient-Rptd) fair Typical amount of pain: (Patient-Rptd) some Does pain affect daily life?: (Patient-Rptd) no Are you currently prescribed opioids?: no  Dietary Habits and Nutritional Risks How many meals a day?: (Patient-Rptd) 3 Eats fruit and vegetables daily?: (Patient-Rptd) yes Most meals are obtained by: (Patient-Rptd) preparing own meals In the last 2 weeks, have you had any of the following?: none Diabetic:: (!) yes (130 before breakfast) Any non-healing wounds?: no How often do you check your BS?: 2 Would you like to be referred to a Nutritionist or for Diabetic Management? : no  Functional Status Activities of Daily Living (to include ambulation/medication): (Patient-Rptd) Independent Ambulation: Independent with device- listed below Home Assistive Devices/Equipment: Eyeglasses; CPAP Medication Administration: (Patient-Rptd) Independent Home Management (perform basic housework or laundry): (Patient-Rptd) Independent Manage your own finances?: (Patient-Rptd) yes Primary transportation is: (Patient-Rptd) driving Concerns about vision?: no *vision screening is required for WTM* Concerns about hearing?: (!) yes Uses hearing aids?: (!) yes Hear whispered voice?: yes  Fall Screening Falls in the past year?: (Patient-Rptd) 1 Number of  falls in past year: (Patient-Rptd) 0 Was there an injury with Fall?: 1 (bruised right shoulder) Fall Risk Category Calculator: 2 Patient Fall Risk Level: Moderate Fall Risk  Fall Risk Patient at Risk for Falls Due to: History of fall(s) Fall risk Follow up: Falls evaluation completed  Home and Transportation Safety: All rugs have non-skid backing?: (Patient-Rptd) yes All stairs or steps have railings?: (Patient-Rptd) yes Grab bars in the bathtub or shower?: (Patient-Rptd) yes Have non-skid surface in bathtub or shower?: (Patient-Rptd) yes Good home lighting?: (Patient-Rptd) yes Regular seat belt use?: (Patient-Rptd) yes Hospital stays in the last year:: (Patient-Rptd) no  Cognitive Assessment Difficulty concentrating, remembering, or making decisions? : (Patient-Rptd) no Will 6CIT or Mini Cog be Completed: yes What year is it?: 0 points What month is it?: 0 points Give patient an address phrase to remember (5 components): [address] [place]  About what time is it?: 0 points Count backwards from 20 to 1: 0 points Say the months of the year in reverse: 0 points Repeat the address phrase from earlier: 0 points 6 CIT Score: 0 points  Advance Directives (For Healthcare) Does Patient Have a Medical Advance Directive?: Yes Type of Advance Directive: Healthcare Power of Attorney Copy of Healthcare Power of Attorney in Chart?: Yes - validated most recent copy scanned in chart (See row information)  Reviewed/Updated  Reviewed/Updated: Reviewed All (Medical, Surgical, Family, Medications, Allergies, Care Teams, Patient Goals)    Allergies (verified) Prednisone    Current Medications (verified) Outpatient Encounter Medications as of 07/10/2024  Medication Sig   ACCU-CHEK GUIDE TEST test strip USE TO TEST BLOOD SUGAR UP TO THREE TIMES A DAY   Accu-Chek Softclix Lancets lancets USE 1 LANCET TO TEST 3 TIMES A DAY   aspirin EC 81 MG tablet Take 81 mg by mouth daily. Swallow whole.    Cyanocobalamin  (VITAMIN B12 PO) Take by mouth.  dapagliflozin  propanediol (FARXIGA ) 10 MG TABS tablet Take 1 tablet (10 mg total) by mouth daily.   Dulaglutide  (TRULICITY ) 3 MG/0.5ML SOAJ Inject 3 mg into the skin once a week.   ezetimibe  (ZETIA ) 10 MG tablet Take 1 tablet (10 mg total) by mouth daily.   fenofibrate  (TRICOR ) 145 MG tablet Take 1 tablet (145 mg total) by mouth daily.   Glucosamine 500 MG CAPS Take by mouth.   ibuprofen  (ADVIL ) 600 MG tablet Take 1 tablet (600 mg total) by mouth every 8 (eight) hours as needed.   LORazepam  (ATIVAN ) 0.5 MG tablet Take 1 tablet (0.5 mg total) by mouth daily.   losartan  (COZAAR ) 50 MG tablet Take 1 tablet (50 mg total) by mouth daily.   MELATONIN PO Take by mouth.   metFORMIN  (GLUCOPHAGE -XR) 500 MG 24 hr tablet Take 2 tablets (1,000 mg total) by mouth 2 (two) times daily.   montelukast  (SINGULAIR ) 10 MG tablet Take 1 tablet (10 mg total) by mouth at bedtime.   Multiple Vitamins-Minerals (MULTIVITAMIN PO) Take by mouth.   Omega-3 Fatty Acids (FISH OIL PO) Take by mouth.   tadalafil  (CIALIS ) 5 MG tablet Take 1 tablet (5 mg total) by mouth daily.   temazepam  (RESTORIL ) 30 MG capsule Take 1 capsule (30 mg total) by mouth at bedtime as needed for sleep.   [DISCONTINUED] cephALEXin  (KEFLEX ) 500 MG capsule Take 4 capsules 1 hour before dental work   No facility-administered encounter medications on file as of 07/10/2024.    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   NOSE SURGERY     NOSE SURGERY     bone spurs   TOTAL KNEE ARTHROPLASTY     UMBILICAL HERNIA REPAIR     Family History  Problem Relation Age of Onset   Heart disease Mother    Hypertension Mother    Heart attack Mother    Parkinson's disease Father    Dementia Brother    Hypertension Brother    Depression Brother    COPD Brother    Asthma Brother    Colon cancer Neg Hx    Stomach cancer Neg Hx    Rectal cancer Neg Hx    Pancreatic cancer Neg Hx    Esophageal cancer Neg Hx    Sleep apnea Neg Hx    Social History   Occupational History   Occupation: Clinical Research Associate    Comment: retired  Tobacco Use   Smoking status: Former    Current packs/day: 0.00    Average packs/day: 1.5 packs/day for 10.0 years (15.0 ttl pk-yrs)    Types: Cigarettes, Pipe    Start date: 08/17/1966    Quit date: 08/17/1976    Years since quitting: 47.9   Smokeless tobacco:  Never   Tobacco comments:    Quit prior to 1980  Vaping Use   Vaping status: Never Used  Substance and Sexual Activity   Alcohol use: Yes    Alcohol/week: 1.0 standard drink of alcohol    Types: 1 Cans of beer per week    Comment: occ glass of wine   Drug use: Never   Sexual activity: Yes    Birth control/protection: None   Tobacco Counseling Counseling given: Not Answered Tobacco comments: Quit prior to 1980  SDOH Screenings   Food Insecurity: No Food Insecurity (07/09/2024)  Housing: Low Risk  (07/09/2024)  Transportation Needs: No Transportation Needs (07/09/2024)  Utilities: Not At Risk (07/10/2024)  Alcohol Screen: Low Risk  (07/09/2024)  Depression (PHQ2-9): Low Risk  (07/10/2024)  Financial Resource Strain: Low Risk  (07/09/2024)  Physical Activity: Sufficiently Active (07/09/2024)  Social Connections: Socially Integrated (07/09/2024)  Stress: Stress Concern Present  (07/09/2024)  Tobacco Use: Medium Risk (07/10/2024)  Health Literacy: Adequate Health Literacy (07/10/2024)   See flowsheets for full screening details  Depression Screen PHQ 2 & 9 Depression Scale- Over the past 2 weeks, how often have you been bothered by any of the following problems? Little interest or pleasure in doing things: 0 Feeling down, depressed, or hopeless (PHQ Adolescent also includes...irritable): 1 (wife ill) PHQ-2 Total Score: 1     Goals Addressed             This Visit's Progress    Patient Stated   On track    Lose weight              Objective:    Today's Vitals   07/10/24 1113  BP: (!) 140/70  Pulse: 97  Temp: 97.9 F (36.6 C)  SpO2: 93%  Weight: 226 lb 6.4 oz (102.7 kg)  Height: 6' 2.5 (1.892 m)   Body mass index is 28.68 kg/m.  Hearing/Vision screen Hearing Screening - Comments:: Hearing aids  Vision Screening - Comments:: Wears rx glasses - up to date with routine eye exams with [facility]  Immunizations and Health Maintenance Health Maintenance  Topic Date Due   Diabetic kidney evaluation - Urine ACR  10/04/2024   FOOT EXAM  10/04/2024   HEMOGLOBIN A1C  10/23/2024   COVID-19 Vaccine (12 - 2025-26 season) 11/07/2024   Diabetic kidney evaluation - eGFR measurement  04/25/2025   OPHTHALMOLOGY EXAM  06/07/2025   Medicare Annual Wellness (AWV)  07/10/2025   DTaP/Tdap/Td (3 - Td or Tdap) 05/13/2029   Pneumococcal Vaccine: 50+ Years  Completed   Influenza Vaccine  Completed   Hepatitis C Screening  Completed   Zoster Vaccines- Shingrix  Completed   Meningococcal B Vaccine  Aged Out   Fecal DNA (Cologuard)  Discontinued        Assessment/Plan:  This is a routine wellness examination for Luis Fernandez.  Patient Care Team: Wendolyn Jenkins Jansky, MD as PCP - General (Family Medicine) Lonni Slain, MD as PCP - Cardiology (Cardiology)  I have personally reviewed and noted the following in the patient's chart:   Medical and  social history Use of alcohol, tobacco or illicit drugs  Current medications and supplements including opioid prescriptions. Functional ability and status Nutritional status Physical activity Advanced directives List of other physicians Hospitalizations, surgeries, and ER visits in previous 12 months Vitals Screenings to include cognitive, depression, and falls Referrals and appointments  No orders of the defined types were placed in this encounter.  In addition, I have reviewed and discussed  with patient certain preventive protocols, quality metrics, and best practice recommendations. A written personalized care plan for preventive services as well as general preventive health recommendations were provided to patient.   Ellouise VEAR Haws, LPN   [id number]   Return in 1 year (on 07/10/2025).  After Visit Summary: (In Person-Printed) AVS printed and given to the patient  Nurse Notes: none

## 2024-07-20 ENCOUNTER — Other Ambulatory Visit (HOSPITAL_COMMUNITY): Payer: Self-pay

## 2024-07-22 ENCOUNTER — Other Ambulatory Visit (HOSPITAL_COMMUNITY): Payer: Self-pay

## 2024-08-04 ENCOUNTER — Other Ambulatory Visit (HOSPITAL_COMMUNITY): Payer: Self-pay

## 2024-08-04 ENCOUNTER — Other Ambulatory Visit: Payer: Self-pay | Admitting: Family Medicine

## 2024-08-05 ENCOUNTER — Other Ambulatory Visit (HOSPITAL_COMMUNITY): Payer: Self-pay

## 2024-08-05 MED ORDER — CEPHALEXIN 500 MG PO CAPS
500.0000 mg | ORAL_CAPSULE | Freq: Once | ORAL | 4 refills | Status: AC
  Filled 2024-08-05: qty 8, 8d supply, fill #0

## 2024-08-07 ENCOUNTER — Other Ambulatory Visit (HOSPITAL_COMMUNITY): Payer: Self-pay

## 2024-08-09 ENCOUNTER — Other Ambulatory Visit (HOSPITAL_COMMUNITY): Payer: Self-pay

## 2024-08-14 ENCOUNTER — Other Ambulatory Visit: Payer: Self-pay | Admitting: Family Medicine

## 2024-08-14 ENCOUNTER — Other Ambulatory Visit: Payer: Self-pay

## 2024-08-14 MED ORDER — LOSARTAN POTASSIUM 50 MG PO TABS
50.0000 mg | ORAL_TABLET | Freq: Every day | ORAL | 0 refills | Status: AC
  Filled 2024-08-14: qty 90, 90d supply, fill #0

## 2024-08-15 ENCOUNTER — Other Ambulatory Visit (HOSPITAL_COMMUNITY): Payer: Self-pay

## 2024-08-27 ENCOUNTER — Other Ambulatory Visit (HOSPITAL_COMMUNITY): Payer: Self-pay

## 2024-08-29 ENCOUNTER — Telehealth: Payer: Self-pay | Admitting: Neurology

## 2024-08-29 NOTE — Telephone Encounter (Signed)
 Mychart to r/s Pt per Provider

## 2024-08-29 NOTE — Telephone Encounter (Signed)
 Appt. Scheduled.

## 2024-08-30 ENCOUNTER — Ambulatory Visit: Admitting: Neurology

## 2024-08-30 ENCOUNTER — Encounter: Payer: Self-pay | Admitting: Neurology

## 2024-08-30 ENCOUNTER — Other Ambulatory Visit: Payer: Self-pay | Admitting: Family Medicine

## 2024-08-30 VITALS — BP 145/71 | HR 92 | Ht 74.0 in | Wt 229.0 lb

## 2024-08-30 DIAGNOSIS — E663 Overweight: Secondary | ICD-10-CM | POA: Diagnosis not present

## 2024-08-30 DIAGNOSIS — E1142 Type 2 diabetes mellitus with diabetic polyneuropathy: Secondary | ICD-10-CM

## 2024-08-30 DIAGNOSIS — G4733 Obstructive sleep apnea (adult) (pediatric): Secondary | ICD-10-CM

## 2024-08-30 NOTE — Progress Notes (Addendum)
 "        Provider:  Dedra Gores, MD  Primary Care Physician:  Wendolyn Jenkins Jansky, MD [address] [place] [address]     Referring Provider: Wendolyn Jenkins Jansky, Md [address] [place],  [place] [address]          Chief Complaint according to patient   Patient presents with:                HISTORY OF PRESENT ILLNESS:  Luis Fernandez is a 79 y.o. male patient who is here for revisit 08/30/2024 for CPAP. 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  She has recovered since then, bu suffers from OCD and anxiety, insomnia.   .  He has remained a compliant CPAP user.   His Epworth sleepiness score is endorsed at 3 out of 24 points so there is no reason to consult of concern of hypersomnia.  His fatigue score is 17 out of 63 which is actually a rare low score in the patient in his late 59s.  He also endorsed the geriatric depression score today at 3 out of 15 points which is not indicative of clinical depression.     His compliance is excellent: The patient uses the machine 30 out of 30 days each of these days over 7 hours consecutively the minimum pressure is 6 the maximum pressure 14 cm water was 2 cm EPR and his residual AHI is 2.8/h there are a few centrals most of sleep remaining apneas are obstructive he does have a high air leak and his 95th percentile pressure is 12.7 cm water.  I would be happy to increase the maximum pressure by 1 cm but it does not need to be done.   08-19-2021:  [name]  is a retired judge from near [place], and has [place] ancestry on his maternal side, [place],   underwent testing by home sleep test device on 01 May 2021 his apnea was confirmed it was actually still severe with an AHI of 47.8.   It was a little stronger during non-REM sleep than during REM sleep.  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  The patient had only a moderate volume of snoring 41 dB and snoring accompanied only about 20% of total access the situation.  The 95th percentile has remained at 11.5 cmH2O very close to the previous settings.  His air leak at the 95th percentile is high 36.7 L/min.  The patient does have facial hair.  4 minutes were spent in oxygen desaturation which is clinically not significant.  Heart rate varied between 51 and 90 bpm, he was a previous user of 11 cm water pressure CPAP was to his pressure CPAP was 2 cm EPR I ordered for him to receive a new machine with an autotitration range between 6 and 14 cmH2O pressure also was 2 cm water expiratory pressure relief, heated humidification and a mask of the patient's choice. He chose a nasal pillow.  He reports he is completely using up all water from the small water chamber of the Air Sense 11.   The patient left for  [place] between the 14th and 27 October unfortunately even as a fully vaccinated patient he contracted COVID there.  He has been able to use the new CPAP compliantly ever since, is using it on average 8 hours and 9 minutes a day 100% compliant by days and hours.  Settings as quoted above residual AHI is 4.5.  There is an unknown number of apneas 0.9/h that he have to deduct from his AHI these are events that occur when there is a high air leak and the machine cannot      Review of Systems: Out of a complete 14 system review, the patient complains of only the following symptoms, and all other reviewed systems are negative.:   SLEEPINESS ?  How likely are you to doze in the following situations: 0 = not likely, 1 = slight chance, 2 = moderate chance, 3 = high chance  Sitting and Reading? Watching Television? Sitting inactive in a public place (theater or meeting)? Lying down  in the afternoon when circumstances permit? Sitting and talking to someone? Sitting quietly after lunch without alcohol? In a car, while stopped for a few minutes in traffic? As a passenger in a car for an hour without a break?  Total = 5  FSS : 20/ 63        Social History   Socioeconomic History   Marital status: Married    Spouse name: [place]end[place]   Number of children: 1   Years of education: 19   Highest education level: Professional school degree (e.g., MD, DDS, DVM, JD)  Occupational History   Occupation: Lawyer    Comment: retired  Tobacco Use   Smoking status: Former    Current packs/day: 0.00    Average packs/day: 1.5 packs/day for 10.0 years (15.0 ttl pk-yrs)    Types: Cigarettes, Pipe    Start date: 08/17/1966    Quit date: 08/17/1976    Years since quitting: 48.0   Smokeless tobacco: Never   Tobacco comments:    Quit prior to 1980  Vaping Use   Vaping status: Never Used  Substance and Sexual Activity   Alcohol use: Yes    Alcohol/week: 1.0 standard drink of alcohol    Types: 1 Cans of beer per week    Comment: occ glass of wine   Drug use: Never   Sexual activity: Yes    Birth control/protection: None  Other Topics Concern   Not on file  Social History Narrative   Lives with spouse at home. He has one child-3 grands. He enjoys reading, travelling and gardening.   Retired judge   Social Drivers of Health   Tobacco Use: Medium Risk (08/30/2024)   Patient History    Smoking Tobacco Use: Former    Smokeless Tobacco Use: Never    Passive Exposure: Not on file  Financial Resource Strain: Low Risk (07/09/2024)   Overall Financial Resource Strain (CARDIA)    Difficulty of Paying Living Expenses: Not hard at all  Food Insecurity: No Food Insecurity (07/09/2024)   Epic    Worried About Programme Researcher, Broadcasting/film/video in the Last Year: Never true    Ran Out of Food in the Last Year: Never true  Transportation Needs: No Transportation Needs (07/09/2024)   Epic    Lack of  Transportation (Medical): No    Lack of Transportation (Non-Medical): No  Physical Activity: Sufficiently Active (07/09/2024)   Exercise Vital Sign    Days of Exercise per Week: 6 days    Minutes  of Exercise per Session: 40 min  Stress: Stress Concern Present (07/09/2024)   [employer] of Occupational Health - Occupational Stress Questionnaire    Feeling of Stress: To some extent  Social Connections: Socially Integrated (07/09/2024)   Social Connection and Isolation Panel    Frequency of Communication with Friends and Family: Three times a week    Frequency of Social Gatherings with Friends and Family: Twice a week    Attends Religious Services: 1 to 4 times per year    Active Member of Clubs or Organizations: Yes    Attends Banker Meetings: More than 4 times per year    Marital Status: Married  Depression (PHQ2-9): Low Risk (07/10/2024)   Depression (PHQ2-9)    PHQ-2 Score: 1  Alcohol Screen: Low Risk (07/09/2024)   Alcohol Screen    Last Alcohol Screening Score (AUDIT): 2  Housing: Low Risk (07/09/2024)   Epic    Unable to Pay for Housing in the Last Year: No    Number of Times Moved in the Last Year: 0    Homeless in the Last Year: No  Utilities: Not At Risk (07/10/2024)   Epic    Threatened with loss of utilities: No  Health Literacy: Adequate Health Literacy (07/10/2024)   B1300 Health Literacy    Frequency of need for help with medical instructions: Never    Family History  Problem Relation Age of Onset   Heart disease Mother    Hypertension Mother    Heart attack Mother    Parkinson's disease Father    Dementia Brother    Hypertension Brother    Depression Brother    COPD Brother    Asthma Brother    Colon cancer Neg Hx    Stomach cancer Neg Hx    Rectal cancer Neg Hx    Pancreatic cancer Neg Hx    Esophageal cancer Neg Hx    Sleep apnea Neg Hx     Past Medical History:  Diagnosis Date   Allergy    Arthritis    Cataract    Surgical  correction in both eyes   Diabetes mellitus without complication (HCC) 25+ years ago   Hyperlipidemia LDL goal <70 12/17/2015   Hypertension 15 to 20 years ago   Under control with medication   Sleep apnea    use cpap machine nightly   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   NOSE SURGERY     NOSE SURGERY     bone spurs   TOTAL KNEE ARTHROPLASTY     UMBILICAL HERNIA REPAIR       Medications Ordered Prior to Encounter[1]  Allergies[2]   DIAGNOSTIC DATA (LABS, IMAGING, TESTING) - I reviewed patient records, labs, notes, testing and imaging myself where available.  Lab Results  Component Value Date   WBC 5.6 04/25/2024   HGB 16.5 04/25/2024   HCT 48.5 04/25/2024   MCV 96.8 04/25/2024   PLT 279.0 04/25/2024      Component Value Date/Time   NA 142 04/25/2024 1454   NA 138 06/13/2015 0000   K 4.6 04/25/2024 1454   CL 105  04/25/2024 1454   CO2 28 04/25/2024 1454   GLUCOSE 170 (H) 04/25/2024 1454   BUN 22 04/25/2024 1454   CREATININE 0.90 04/25/2024 1454   CREATININE 0.90 01/19/2022 0000   CALCIUM  10.2 04/25/2024 1454   PROT 7.0 04/25/2024 1454   ALBUMIN 4.6 04/25/2024 1454   ALBUMIN 3.8 03/07/2015 0000   AST 31 04/25/2024 1454   ALT 38 04/25/2024 1454   ALKPHOS 47 04/25/2024 1454   BILITOT 0.6 04/25/2024 1454   GFRNONAA 79 01/09/2021 0000   GFRAA 92 01/09/2021 0000   Lab Results  Component Value Date   CHOL 172 10/05/2023   HDL 35.30 (L) 10/05/2023   LDLCALC 99 10/05/2023   LDLDIRECT 129.0 04/01/2023   TRIG 191.0 (H) 10/05/2023   CHOLHDL 5 10/05/2023   Lab Results  Component Value Date   HGBA1C 7.3 (H) 04/25/2024   Lab Results  Component  Value Date   VITAMINB12 >1537 (H) 10/05/2023   Lab Results  Component Value Date   TSH 0.61 09/30/2022    PHYSICAL EXAM:  Vitals:   08/30/24 1329  BP: (!) 145/71  Pulse: 92   No data found. Body mass index is 29.4 kg/m.   Wt Readings from Last 3 Encounters:  08/30/24 229 lb (103.9 kg)  07/10/24 226 lb 6.4 oz (102.7 kg)  04/25/24 224 lb 2 oz (101.7 kg)     Ht Readings from Last 3 Encounters:  08/30/24 6' 2 (1.88 m)  07/10/24 6' 2.5 (1.892 m)  04/25/24 6' 2 (1.88 m)      General: The patient is awake, alert and appears not in acute distress and groomed. Head: Normocephalic, atraumatic.  Neck is supple. The patient is awake, alert and appears not in acute distress  Facial hair, long hair.  Head: Normocephalic, atraumatic. Neck is supple. Mallampati 1,  neck circumference:18 inches . Nasal airflow barely patent.  Retrognathia is not  seen.  Dental status: biological teeth and one bridge - front teeth Cardiovascular:  Regular rate and cardiac rhythm by pulse- without distended neck veins. Respiratory: Lungs are clear to auscultation.  Skin:  Without evidence of ankle edema, or rash.  Varicose veins. Trunk: The patient's posture is erect.   Neurologic exam : The patient is awake and alert, oriented to place and time.   Memory subjective described as intact.  Attention span & concentration ability appears normal.  Speech is fluent, without dysarthria, but with dysphonia.  Mood and affect are appropriate.   Cranial nerves: no loss of smell or taste reported  Pupils are equal and briskly reactive to light. Funduscopic exam deferred .  Extraocular movements in vertical and horizontal planes were intact . Visual fields by finger perimetry are intact. Hearing impaired, aids in place-   Facial sensation intact to fine touch.  Facial motor strength is symmetric and tongue and uvula move midline.  Neck ROM : rotation, tilt and flexion extension were normal for age and  shoulder shrug was symmetrical.    Motor exam:  Symmetric bulk, tone and ROM.   Sensory: vibration was normal.  Proprioception tested in the upper extremities was normal.  ASSESSMENT AND PLAN :   79 y.o. year old male  here with: OSA, CPAP compliance.     1) OSA on CPAP, highly compliant 100%, and only problem is a high air leak. AHI 5.3/ h.  Under the nose cradle mask. He likes this set up.    2) facial hair- this is cause of the air leak, we  discussed that for several years now.    3) Humidifier works well with the adjusted setting.    We leave everything as is.    Rv in 12 months.  HST was 04-01-2021- his  Machine will be 80 years old in 2027.     I would like to thank Wendolyn Jenkins Jansky, MD for allowing me to meet with this pleasant patient.   Sleep Clinic Patients are generally offered input on sleep hygiene, life style changes and how to improve compliance with medical treatment where applicable. Review and reiteration of good sleep hygiene measures is offered to any sleep clinic patient, be it in the first consultation or with any follow up visits. Any patient with sleepiness should be cautioned not to drive, work at heights, or operate dangerous or heavy equipment when feeling tired or sleepy.    The patient will be seen in follow-up in the sleep clinic at [facility] for discussion of test results, sleep related symptoms and treatment compliance review, further management strategies, etc.   The referring provider will be notified of the test results.   The patient's condition requires frequent monitoring and adjustments in the treatment plan, reflecting the ongoing complexity of care.  This provider is the continuing focal point for all needed services for this condition.  After spending a total time of  30  minutes face to face and time for  history taking, physical and neurologic examination, review of laboratory studies,  personal review of imaging studies, reports and results of  other testing and review of referral information / records as far as provided in visit,   Electronically signed by: Dedra Gores, MD 08/30/2024 1:57 PM  [facility] Board certified by The [employer] of Sleep Medicine and Diplomate of the [employer] of Sleep Medicine. Board certified In Neurology through the ABPN, Fellow of the [employer] of Neu[employer].      [1]  Current Outpatient Medications on File Prior to Visit  Medication Sig Dispense Refill   aspirin EC 81 MG tablet Take 81 mg by mouth daily. Swallow whole.     Cyanocobalamin  (VITAMIN B12 PO) Take by mouth.     dapagliflozin  propanediol (FARXIGA ) 10 MG TABS tablet Take 1 tablet (10 mg total) by mouth daily. 90 tablet 3   Dulaglutide  (TRULICITY ) 3 MG/0.5ML SOAJ Inject 3 mg into the skin once a week. 6 mL 1   ezetimibe  (ZETIA ) 10 MG tablet Take 1 tablet (10 mg total) by mouth daily. 90 tablet 3   fenofibrate  (TRICOR ) 145 MG tablet Take 1 tablet (145 mg total) by mouth daily. 90 tablet 3   Glucosamine 500 MG CAPS Take by mouth.     ibuprofen  (ADVIL ) 600 MG tablet Take 1 tablet (600 mg total) by mouth every 8 (eight) hours as needed. 270 tablet 1   LORazepam  (ATIVAN ) 0.5 MG tablet Take 1 tablet (0.5 mg total) by mouth daily. 30 tablet 3   losartan  (COZAAR ) 50 MG tablet Take 1 tablet (50 mg total) by mouth daily. 90 tablet 0   MELATONIN PO Take by mouth.     metFORMIN  (GLUCOPHAGE -XR) 500 MG 24 hr tablet Take 2 tablets (1,000 mg total) by mouth 2 (two) times daily. 360 tablet 1   montelukast  (SINGULAIR ) 10 MG tablet Take 1 tablet (10 mg total) by mouth at bedtime. 90 tablet 2   Multiple Vitamins-Minerals (MULTIVITAMIN PO) Take by mouth.     Omega-3 Fatty Acids (FISH OIL PO) Take by  mouth.     tadalafil  (CIALIS ) 5 MG tablet Take 1 tablet (5 mg total) by mouth daily. 90 tablet 3   temazepam  (RESTORIL ) 30 MG capsule Take 1 capsule (30 mg total) by mouth at bedtime as needed for sleep.  30 capsule 4   No current facility-administered medications on file prior to visit.  [2]  Allergies Allergen Reactions   Prednisone      Intolerance    "

## 2024-08-30 NOTE — Progress Notes (Signed)
 Luis Fernandez

## 2024-08-30 NOTE — Patient Instructions (Signed)
 Quality Sleep Information, Adult Quality sleep is important for your mental and physical health. It also improves your quality of life. Quality sleep means you: Are asleep for most of the time you are in bed. Fall asleep within 30 minutes. Wake up no more than once a night. Are awake for no longer than 20 minutes if you do wake up during the night. Most adults need 7-8 hours of quality sleep each night. How can poor sleep affect me? If you do not get enough quality sleep, you may have: Mood swings. Daytime sleepiness. Decreased alertness, reaction time, and concentration. Sleep disorders, such as insomnia and sleep apnea. Difficulty with: Solving problems. Coping with stress. Paying attention. These issues may affect your performance and productivity at work, school, and home. Lack of sleep may also put you at higher risk for accidents, suicide, and risky behaviors. If you do not get quality sleep, you may also be at higher risk for several health problems, including: Infections. Type 2 diabetes. Heart disease. High blood pressure. Obesity. Worsening of long-term conditions, like arthritis, kidney disease, depression, Parkinson's disease, and epilepsy. What actions can I take to get more quality sleep? Sleep schedule and routine Stick to a sleep schedule. Go to sleep and wake up at about the same time each day. Do not try to sleep less on weekdays and make up for lost sleep on weekends. This does not work. Limit naps during the day to 30 minutes or less. Do not take naps in the late afternoon. Make time to relax before bed. Reading, listening to music, or taking a hot bath promotes quality sleep. Make your bedroom a place that promotes quality sleep. Keep your bedroom dark, quiet, and at a comfortable room temperature. Make sure your bed is comfortable. Avoid using electronic devices that give off bright blue light for 30 minutes before bedtime. Your brain perceives bright blue light  as sunlight. This includes television, phones, and computers. If you are lying awake in bed for longer than 20 minutes, get up and do a relaxing activity until you feel sleepy. Lifestyle     Try to get at least 30 minutes of exercise on most days. Do not exercise 2-3 hours before going to bed. Do not use any products that contain nicotine or tobacco. These products include cigarettes, chewing tobacco, and vaping devices, such as e-cigarettes. If you need help quitting, ask your health care provider. Do not drink caffeinated beverages for at least 8 hours before going to bed. Coffee, tea, and some sodas contain caffeine. Do not drink alcohol or eat large meals close to bedtime. Try to get at least 30 minutes of sunlight every day. Morning sunlight is best. Medical concerns Work with your health care provider to treat medical conditions that may affect sleeping, such as: Nasal obstruction. Snoring. Sleep apnea and other sleep disorders. Talk to your health care provider if you think any of your prescription medicines may cause you to have difficulty falling or staying asleep. If you have sleep problems, talk with a sleep consultant. If you think you have a sleep disorder, talk with your health care provider about getting evaluated by a specialist. Where to find more information Sleep Foundation: sleepfoundation.org American Academy of Sleep Medicine: aasm.org Centers for Disease Control and Prevention (CDC): TonerPromos.no Contact a health care provider if: You have trouble getting to sleep or staying asleep. You often wake up very early in the morning and cannot get back to sleep. You have daytime sleepiness. You  have daytime sleep attacks of suddenly falling asleep and sudden muscle weakness (narcolepsy). You have a tingling sensation in your legs with a strong urge to move your legs (restless legs syndrome). You stop breathing briefly during sleep (sleep apnea). You think you have a sleep  disorder or are taking a medicine that is affecting your quality of sleep. Summary Most adults need 7-8 hours of quality sleep each night. Getting enough quality sleep is important for your mental and physical health. Make your bedroom a place that promotes quality sleep, and avoid things that may cause you to have poor sleep, such as alcohol, caffeine, smoking, or large meals. Talk to your health care provider if you have trouble falling asleep or staying asleep. This information is not intended to replace advice given to you by your health care provider. Make sure you discuss any questions you have with your health care provider. Document Revised: 11/12/2021 Document Reviewed: 11/12/2021 Elsevier Patient Education  2024 [employer]CPAP and BIPAP Information CPAP and BIPAP use air pressure to keep your airways open and help you breathe well. CPAP and BIPAP use different amounts of pressure. Your health care provider will tell you whether CPAP or BIPAP would be best for you. CPAP stands for continuous positive airway pressure. With CPAP, the amount of pressure stays the same while you breathe in and out. BIPAP stands for bi-level positive airway pressure. With BIPAP, the amount of pressure will be higher when you breathe in and lower when you breathe out. This allows you to take bigger breaths. CPAP or BIPAP may be used in the hospital or at home. You may need to have a sleep study before your provider can order a device for you to use at home. What are the advantages? CPAP and BIPAP are most often used for obstructive sleep apnea to keep the airways from collapsing when the muscles relax during sleep. CPAP or BIPAP can be used if you have: Chronic obstructive pulmonary disease. Heart failure. Medical conditions that cause muscle weakness. Other problems that cause breathing to be shallow, weak, or difficult. What are the risks? Your provider will talk with you about risks. These may  include: Sores on your nose or face caused from the mask, prongs, or nasal pillows. Dry or stuffy nose or nosebleeds. Feeling gassy or bloated. Sinus or lung infection if the equipment is not cleaned well. When should CPAP or BIPAP be used? In most cases, CPAP or BIPAP is used during sleep at night or whenever the main sleep time happens. It's also used during naps. People with some medical conditions may need to wear the mask when they're awake. Follow instructions from your provider about when to use your CPAP or BIPAP. What happens during CPAP or BIPAP?  Both CPAP and BIPAP use a small machine that uses electricity to create air pressure. A long tube connects the device to a plastic mask. Air is blown through the mask into your nose or mouth. The amount of pressure that's used to blow the air can be adjusted. Your provider will set the pressure setting and help you find the best mask for you. Tips for using the mask There are different types and sizes of masks. If your mask does not fit well, talk with your provider about getting a different one. Some common types of masks include: Full face masks, which fit over the mouth and nose. Nasal masks, which fit over the nose. Nasal pillow or prong masks, which fit into the  nostrils. The mask needs to be snug to your face, so some people feel trapped or closed in at first. If you feel this way, you may need to get used to the mask. Hold the mask loosely over your nose or mouth and then gradually put the the mask on more snugly. Slowly increase the amount of time you use the mask. If you have trouble with your mask not fitting well or leaking, talk with your provider. Do not stop using the mask. Tips for using the device Follow instructions from your provider about how to and how often to use the device. For home use, CPAP and BIPAP devices come from home health care companies. There are many different brands. Your health insurance company will help  to decide which device you get. Keep the CPAP or BIPAP device and attachments clean. Ask your home health care company or check the instruction book for cleaning instructions. Make sure the humidifier is filled with germ-free (sterile) water and is working correctly. This will help prevent a dry or stuffy nose or nosebleeds. A nasal saline mist or spray may keep your nose from getting dry and sore. Do not eat or drink while the CPAP or BIPAP device is on. Food or drinks could get pushed into your lungs by the pressure of the CPAP or BIPAP. Follow these instructions at home: Take over-the-counter and prescription medicines only as told by your provider. Do not smoke, vape, or use nicotine or tobacco. Contact a health care provider if: You have redness or pressure sores on your head, face, mouth, or nose from the mask or headgear. You have trouble using the CPAP or BIPAP device. You have trouble going to sleep or staying asleep. Someone tells you that you snore even when wearing your CPAP or BIPAP device. Get help right away if: You have trouble breathing. You feel confused. These symptoms may be an emergency. Get help right away. Call 911. Do not wait to see if the symptoms will go away. Do not drive yourself to the hospital. This information is not intended to replace advice given to you by your health care provider. Make sure you discuss any questions you have with your health care provider. Document Revised: 11/11/2022 Document Reviewed: 11/11/2022 Elsevier Patient Education  2024 [employer].

## 2024-08-31 ENCOUNTER — Ambulatory Visit: Payer: Medicare Other | Admitting: Neurology

## 2024-09-04 ENCOUNTER — Other Ambulatory Visit: Payer: Self-pay | Admitting: Family Medicine

## 2024-09-04 ENCOUNTER — Other Ambulatory Visit (HOSPITAL_COMMUNITY): Payer: Self-pay

## 2024-09-04 ENCOUNTER — Other Ambulatory Visit: Payer: Self-pay

## 2024-09-04 MED ORDER — LORAZEPAM 0.5 MG PO TABS
0.5000 mg | ORAL_TABLET | Freq: Every day | ORAL | 1 refills | Status: AC
  Filled 2024-09-04: qty 30, 30d supply, fill #0

## 2024-10-23 ENCOUNTER — Ambulatory Visit: Admitting: Family Medicine

## 2024-10-24 ENCOUNTER — Ambulatory Visit: Admitting: Family Medicine

## 2025-07-11 ENCOUNTER — Ambulatory Visit

## 2025-08-30 ENCOUNTER — Ambulatory Visit: Admitting: Neurology
